# Patient Record
Sex: Female | Born: 1940 | Race: White | Hispanic: No | Marital: Married | Smoking: Never smoker
Health system: Southern US, Community
[De-identification: ages and names within clinical notes are randomized; demographics above are authoritative.]

## PROBLEM LIST (undated history)

## (undated) DIAGNOSIS — Z8711 Personal history of peptic ulcer disease: Secondary | ICD-10-CM

## (undated) DIAGNOSIS — K5790 Diverticulosis of intestine, part unspecified, without perforation or abscess without bleeding: Secondary | ICD-10-CM

## (undated) DIAGNOSIS — I639 Cerebral infarction, unspecified: Secondary | ICD-10-CM

## (undated) DIAGNOSIS — I10 Essential (primary) hypertension: Secondary | ICD-10-CM

## (undated) DIAGNOSIS — E785 Hyperlipidemia, unspecified: Secondary | ICD-10-CM

## (undated) DIAGNOSIS — K635 Polyp of colon: Secondary | ICD-10-CM

## (undated) DIAGNOSIS — N3090 Cystitis, unspecified without hematuria: Secondary | ICD-10-CM

## (undated) DIAGNOSIS — I480 Paroxysmal atrial fibrillation: Secondary | ICD-10-CM

## (undated) DIAGNOSIS — E1110 Type 2 diabetes mellitus with ketoacidosis without coma: Secondary | ICD-10-CM

## (undated) DIAGNOSIS — E119 Type 2 diabetes mellitus without complications: Secondary | ICD-10-CM

## (undated) DIAGNOSIS — J189 Pneumonia, unspecified organism: Secondary | ICD-10-CM

## (undated) HISTORY — PX: INSERTION OF CARDIAC MONITOR: SHX6657

## (undated) HISTORY — PX: EYE SURGERY: SHX253

## (undated) HISTORY — DX: Personal history of peptic ulcer disease: Z87.11

## (undated) HISTORY — DX: Essential (primary) hypertension: I10

## (undated) HISTORY — PX: CATARACT EXTRACTION: SUR2

## (undated) HISTORY — PX: ESOPHAGOGASTRODUODENOSCOPY: SHX1529

## (undated) HISTORY — DX: Hyperlipidemia, unspecified: E78.5

## (undated) HISTORY — DX: Pneumonia, unspecified organism: J18.9

---

## 1992-07-07 HISTORY — PX: ABDOMINAL HYSTERECTOMY: SHX81

## 2004-06-05 ENCOUNTER — Ambulatory Visit: Payer: Self-pay | Admitting: Internal Medicine

## 2004-07-04 ENCOUNTER — Ambulatory Visit: Payer: Self-pay | Admitting: Family Medicine

## 2004-07-05 ENCOUNTER — Ambulatory Visit: Payer: Self-pay | Admitting: Internal Medicine

## 2005-03-27 ENCOUNTER — Ambulatory Visit: Payer: Self-pay | Admitting: Internal Medicine

## 2005-03-27 LAB — HM COLONOSCOPY: HM Colonoscopy: NORMAL

## 2005-07-15 ENCOUNTER — Ambulatory Visit: Payer: Self-pay | Admitting: Family Medicine

## 2006-07-17 ENCOUNTER — Ambulatory Visit: Payer: Self-pay | Admitting: Family Medicine

## 2006-11-11 ENCOUNTER — Ambulatory Visit: Payer: Self-pay | Admitting: Otolaryngology

## 2006-11-25 ENCOUNTER — Ambulatory Visit: Payer: Self-pay | Admitting: Family Medicine

## 2007-05-03 DIAGNOSIS — I10 Essential (primary) hypertension: Secondary | ICD-10-CM | POA: Insufficient documentation

## 2007-07-19 ENCOUNTER — Ambulatory Visit: Payer: Self-pay | Admitting: Family Medicine

## 2008-04-16 DIAGNOSIS — J309 Allergic rhinitis, unspecified: Secondary | ICD-10-CM | POA: Insufficient documentation

## 2008-04-18 ENCOUNTER — Ambulatory Visit: Payer: Self-pay | Admitting: Family Medicine

## 2008-07-20 ENCOUNTER — Ambulatory Visit: Payer: Self-pay | Admitting: Family Medicine

## 2008-12-21 DIAGNOSIS — J45909 Unspecified asthma, uncomplicated: Secondary | ICD-10-CM | POA: Insufficient documentation

## 2009-07-25 ENCOUNTER — Ambulatory Visit: Payer: Self-pay | Admitting: Family Medicine

## 2010-08-01 ENCOUNTER — Ambulatory Visit: Payer: Self-pay | Admitting: Family Medicine

## 2011-08-04 ENCOUNTER — Ambulatory Visit: Payer: Self-pay | Admitting: Family Medicine

## 2012-08-04 ENCOUNTER — Ambulatory Visit: Payer: Self-pay | Admitting: Family Medicine

## 2013-08-05 ENCOUNTER — Ambulatory Visit: Payer: Self-pay | Admitting: Family Medicine

## 2013-09-12 ENCOUNTER — Observation Stay: Payer: Self-pay | Admitting: Internal Medicine

## 2013-09-12 LAB — URINALYSIS, COMPLETE
Bilirubin,UR: NEGATIVE
Glucose,UR: NEGATIVE mg/dL (ref 0–75)
KETONE: NEGATIVE
NITRITE: POSITIVE
PROTEIN: NEGATIVE
Ph: 5 (ref 4.5–8.0)
RBC,UR: 4 /HPF (ref 0–5)
Specific Gravity: 1.011 (ref 1.003–1.030)
Squamous Epithelial: 2

## 2013-09-12 LAB — COMPREHENSIVE METABOLIC PANEL
ALBUMIN: 3.4 g/dL (ref 3.4–5.0)
Alkaline Phosphatase: 74 U/L
Anion Gap: 8 (ref 7–16)
BILIRUBIN TOTAL: 0.4 mg/dL (ref 0.2–1.0)
BUN: 16 mg/dL (ref 7–18)
CALCIUM: 8.6 mg/dL (ref 8.5–10.1)
CO2: 28 mmol/L (ref 21–32)
Chloride: 103 mmol/L (ref 98–107)
Creatinine: 1.06 mg/dL (ref 0.60–1.30)
EGFR (African American): >60
EGFR (Non-African Amer.): 53 — ABNORMAL LOW
Glucose: 165 mg/dL — ABNORMAL HIGH (ref 65–99)
OSMOLALITY: 282 (ref 275–301)
POTASSIUM: 3.2 mmol/L — AB (ref 3.5–5.1)
SGOT(AST): 32 U/L (ref 15–37)
SGPT (ALT): 16 U/L (ref 12–78)
SODIUM: 139 mmol/L (ref 136–145)
Total Protein: 7.5 g/dL (ref 6.4–8.2)

## 2013-09-12 LAB — CBC WITH DIFFERENTIAL/PLATELET
Basophil #: 0.1 10*3/uL (ref 0.0–0.1)
Basophil %: 0.9 %
Eosinophil #: 0.3 10*3/uL (ref 0.0–0.7)
Eosinophil %: 3.3 %
HCT: 39.1 % (ref 35.0–47.0)
HGB: 12.7 g/dL (ref 12.0–16.0)
Lymphocyte #: 1.7 10*3/uL (ref 1.0–3.6)
Lymphocyte %: 18.3 %
MCH: 30.4 pg (ref 26.0–34.0)
MCHC: 32.5 g/dL (ref 32.0–36.0)
MCV: 93 fL (ref 80–100)
MONOS PCT: 4 %
Monocyte #: 0.4 x10 3/mm (ref 0.2–0.9)
NEUTROS ABS: 6.7 10*3/uL — AB (ref 1.4–6.5)
Neutrophil %: 73.5 %
Platelet: 246 10*3/uL (ref 150–440)
RBC: 4.18 10*6/uL (ref 3.80–5.20)
RDW: 12.8 % (ref 11.5–14.5)
WBC: 9.2 10*3/uL (ref 3.6–11.0)

## 2013-09-12 LAB — RAPID INFLUENZA A&B ANTIGENS

## 2013-09-13 LAB — BASIC METABOLIC PANEL
Anion Gap: 5 — ABNORMAL LOW (ref 7–16)
BUN: 14 mg/dL (ref 7–18)
CHLORIDE: 100 mmol/L (ref 98–107)
CREATININE: 1.18 mg/dL (ref 0.60–1.30)
Calcium, Total: 8.6 mg/dL (ref 8.5–10.1)
Co2: 32 mmol/L (ref 21–32)
EGFR (African American): 54 — ABNORMAL LOW
EGFR (Non-African Amer.): 47 — ABNORMAL LOW
Glucose: 106 mg/dL — ABNORMAL HIGH (ref 65–99)
OSMOLALITY: 275 (ref 275–301)
POTASSIUM: 3.1 mmol/L — AB (ref 3.5–5.1)
Sodium: 137 mmol/L (ref 136–145)

## 2013-09-13 LAB — CBC WITH DIFFERENTIAL/PLATELET
BASOS PCT: 0.4 %
Basophil #: 0.1 10*3/uL (ref 0.0–0.1)
EOS ABS: 0.1 10*3/uL (ref 0.0–0.7)
Eosinophil %: 0.8 %
HCT: 34.8 % — AB (ref 35.0–47.0)
HGB: 11.7 g/dL — ABNORMAL LOW (ref 12.0–16.0)
LYMPHS ABS: 2.7 10*3/uL (ref 1.0–3.6)
Lymphocyte %: 20.9 %
MCH: 31.4 pg (ref 26.0–34.0)
MCHC: 33.6 g/dL (ref 32.0–36.0)
MCV: 94 fL (ref 80–100)
Monocyte #: 0.5 x10 3/mm (ref 0.2–0.9)
Monocyte %: 3.8 %
Neutrophil #: 9.5 10*3/uL — ABNORMAL HIGH (ref 1.4–6.5)
Neutrophil %: 74.1 %
PLATELETS: 198 10*3/uL (ref 150–440)
RBC: 3.72 10*6/uL — ABNORMAL LOW (ref 3.80–5.20)
RDW: 13.1 % (ref 11.5–14.5)
WBC: 12.8 10*3/uL — ABNORMAL HIGH (ref 3.6–11.0)

## 2013-09-13 LAB — URINE CULTURE

## 2013-09-17 LAB — CULTURE, BLOOD (SINGLE)

## 2014-07-04 ENCOUNTER — Ambulatory Visit: Payer: Self-pay | Admitting: Family Medicine

## 2014-07-04 LAB — HM DEXA SCAN

## 2014-08-03 ENCOUNTER — Ambulatory Visit: Payer: Self-pay | Admitting: Ophthalmology

## 2014-08-03 LAB — POTASSIUM: POTASSIUM: 3.7 mmol/L (ref 3.5–5.1)

## 2014-08-07 ENCOUNTER — Ambulatory Visit: Payer: Self-pay | Admitting: Family Medicine

## 2014-08-07 LAB — HM MAMMOGRAPHY: HM Mammogram: NEGATIVE

## 2014-08-10 ENCOUNTER — Ambulatory Visit: Payer: Self-pay | Admitting: Ophthalmology

## 2014-09-13 ENCOUNTER — Ambulatory Visit: Payer: Self-pay | Admitting: Ophthalmology

## 2014-09-21 ENCOUNTER — Ambulatory Visit: Payer: Self-pay | Admitting: Ophthalmology

## 2014-10-25 LAB — LIPID PANEL
CHOLESTEROL: 224 mg/dL — AB (ref 0–200)
HDL: 67 mg/dL (ref 35–70)
LDL CALC: 128 mg/dL
TRIGLYCERIDES: 143 mg/dL (ref 40–160)

## 2014-10-25 LAB — BASIC METABOLIC PANEL
BUN: 17 mg/dL (ref 4–21)
Creatinine: 1 mg/dL (ref 0.5–1.1)
Glucose: 111 mg/dL
Potassium: 4 mmol/L (ref 3.4–5.3)
SODIUM: 143 mmol/L (ref 137–147)

## 2014-10-25 LAB — HEMOGLOBIN A1C: Hgb A1c MFr Bld: 6.5 % — AB (ref 4.0–6.0)

## 2014-10-28 NOTE — Discharge Summary (Signed)
Dates of Admission and Diagnosis:  Date of Admission 12-Sep-2013   Date of Discharge 13-Sep-2013   Admitting Diagnosis Sepsis due to Pneumonia and UTI   Final Diagnosis Sepsis due to Pneumonia and UTI Asthma Hypertension, P stable in hospital without meds- with diet control. Hyperlipidemia    Chief Complaint/History of Present Illness The patient is a 74 year old pleasant Caucasian female with past medical history of hypertension, hyperlipidemia, and asthma. He is presenting to the ER with the chief complaint of intermittent episodes of fever for the past 1 week and seen by her PCP last Thursday and started on cough medicines. Flu test was negative at that time. She felt slightly better, but today morning she woke up with chills and rigors associated with nausea and temperature was 102 degrees Fahrenheit in the ED. Denies any chest pain but feels weak and tired. Denies any dizziness or loss of consciousness.   Allergies:  Sulfa drugs: Unknown  Hepatic:  09-Mar-15 04:13   Bilirubin, Total 0.4  Alkaline Phosphatase 74 (45-117 NOTE: New Reference Range 05/27/13)  SGPT (ALT) 16  SGOT (AST) 32  Total Protein, Serum 7.5  Albumin, Serum 3.4  Routine Chem:  09-Mar-15 04:13   Glucose, Serum  165  BUN 16  Creatinine (comp) 1.06  Sodium, Serum 139  Potassium, Serum  3.2  Chloride, Serum 103  CO2, Serum 28  Calcium (Total), Serum 8.6  Anion Gap 8  Osmolality (calc) 282  eGFR (African American) >60  eGFR (Non-African American)  53 (eGFR values <68mL/min/1.73 m2 may be an indication of chronic kidney disease (CKD). Calculated eGFR is useful in patients with stable renal function. The eGFR calculation will not be reliable in acutely ill patients when serum creatinine is changing rapidly. It is not useful in  patients on dialysis. The eGFR calculation may not be applicable to patients at the low and high extremes of body sizes, pregnant women, and vegetarians.)  Routine Hem:   09-Mar-15 04:13   WBC (CBC) 9.2  RBC (CBC) 4.18  Hemoglobin (CBC) 12.7  Hematocrit (CBC) 39.1  Platelet Count (CBC) 246  MCV 93  MCH 30.4  MCHC 32.5  RDW 12.8  Neutrophil % 73.5  Lymphocyte % 18.3  Monocyte % 4.0  Eosinophil % 3.3  Basophil % 0.9  Neutrophil #  6.7  Lymphocyte # 1.7  Monocyte # 0.4  Eosinophil # 0.3  Basophil # 0.1 (Result(s) reported on 12 Sep 2013 at 04:26AM.)   PERTINENT RADIOLOGY STUDIES: XRay:    09-Mar-15 04:41, Chest PA and Lateral  Chest PA and Lateral   REASON FOR EXAM:    fever, cough  COMMENTS:       PROCEDURE: DXR - DXR CHEST PA (OR AP) AND LATERAL  - Sep 12 2013  4:41AM     CLINICAL DATA:  Cough for 1 week, fever.  History of asthma.    EXAM:  CHEST  2 VIEW    COMPARISON:  Chest radiograph report July 20, 1995 though images  are not available for direct comparison.    FINDINGS:  Cardiomediastinal silhouette is nonsuspicious, mildly calcified  aortic knob. Mild interstitial prominence, with strandy densities in  the lingula. No pleural effusions. No pneumothorax.    Soft tissue planes and included osseous structures are  nonsuspicious. Moderate approximate T12-L1 degenerative disc  disease.     IMPRESSION:  Mild interstitial prominence with lingular strandy densities,  findings could reflect bronchitis or possibly reactive airway  disease with lingular atelectasis.  Electronically Signed    By: Elon Alas    On: 09/12/2013 04:58         Verified By: Ricky Ala, [name],   Pertinent Past History:  Pertinent Past History PAST MEDICAL HISTORY: Hypertension as well as hyperlipidemia.   PAST SURGICAL HISTORY: Hysterectomy.   Hospital Course:  Hospital Course 1.  Sepsis secondary to pneumonia and acute cystitis.  given IV levofloxacin. Blcx negative- improved. 2.  Hypertension. held hydrochlorothiazide , BP is normal. does not need BP meds- advised to have diet control. 3.  Hyperlipidemia. Continue  statin.  4.  Asthma, no exacerbation. continue her inhalers.   Condition on Discharge Stable   Code Status:  Code Status Full Code   DISCHARGE INSTRUCTIONS HOME MEDS:  Medication Reconciliation: Patient's Home Medications at Discharge:     Medication Instructions  advair diskus 250 mcg-50 mcg inhalation powder  1 puff(s) inhaled 2 times a day   lovastatin 20 mg oral tablet  1 tab(s) orally once a day   montelukast 10 mg oral tablet  1 tab(s) orally once a day (in the evening)   fish oil - oral capsule  1 cap(s) orally once a day   vitamin e  1 tab(s) orally once a day   levaquin 750 mg oral tablet  1 tab(s) orally every other day x 6 days    STOP TAKING THE FOLLOWING MEDICATION(S):    hydrochlorothiazide 25 mg oral tablet: 1 tab(s) orally once a day  Physician's Instructions:  Home Health? No   Diet Low Sodium   Activity Limitations As tolerated   Return to Work Not Applicable   Time frame for Follow Up Appointment 1-2 weeks   Other Comments routine check up for Blood pressure meds with PMD.   Electronic Signatures: Vaughan Basta (MD)  (Signed 14-Mar-15 03:56)  Authored: ADMISSION DATE AND DIAGNOSIS, CHIEF COMPLAINT/HPI, Allergies, PERTINENT LABS, PERTINENT RADIOLOGY STUDIES, PERTINENT PAST [place] MEDS, PATIENT INSTRUCTIONS   Last Updated: 14-Mar-15 03:56 by Vaughan Basta (MD)

## 2014-10-28 NOTE — H&P (Signed)
PATIENT NAME:  Destiny Gilbert, MARRONE MR#:  [id number] DATE OF BIRTH:  [birth year]  DATE OF ADMISSION:  09/12/2013  PRIMARY CARE PHYSICIAN: Lelon Huh, MD  REFERRING PHYSICIAN: Conni Slipper, MD   CHIEF COMPLAINT: Fever and cough for 1 week, chills and rigors today.   HISTORY OF PRESENT ILLNESS: The patient is a 74 year old pleasant Caucasian female with past medical history of hypertension, hyperlipidemia, and asthma. He is presenting to the ER with the chief complaint of intermittent episodes of fever for the past 1 week and seen by her PCP last Thursday and started on cough medicines. Flu test was negative at that time. She felt slightly better, but today morning she woke up with chills and rigors associated with nausea and temperature was 102 degrees Fahrenheit in the ED. Denies any chest pain but feels weak and tired. Denies any dizziness or loss of consciousness.   PAST MEDICAL HISTORY: Hypertension as well as hyperlipidemia.   PAST SURGICAL HISTORY: Hysterectomy.   ALLERGIES: SULFA DRUGS.   PSYCHOSOCIAL HISTORY: Lives at home with husband. Denies any history of smoking, alcohol, or illicit drug usage.   FAMILY HISTORY: Hypertension runs in her family.   HOME MEDICATIONS: Vitamin E 1 tablet p.o. once daily, singular 10 mg p.o. once daily, hydrochlorothiazide 25 mg p.o. once daily, fish oil 1 capsule p.o. once daily, Advair 250 mcg 1 puff inhalation 2 times a day.  REVIEW OF SYSTEMS:  CONSTITUTIONAL: Denies any weakness but complaining of fever and fatigue. Denies weight loss or weight gain. EYES: Denies blurry vision, double vision.  ENT: Denies epistaxis, discharge.  RESPIRATORY: Has chronic history of asthma. Has 1 week history of cough.  CARDIOVASCULAR: No chest pain, palpitations.  GASTROINTESTINAL: Denies nausea, vomiting, diarrhea.  GENITOURINARY: No dysuria, hematuria.  GYNECOLOGIC AND BREASTS: Denies breast mass or vaginal discharge.  ENDOCRINE: Denies polyuria, nocturia,  thyroid problems.  HEMATOLOGIC AND LYMPHATIC: Denies any easy bruising or bleeding. INTEGUMENTARY: No acne, rash, or lesions. MUSCULOSKELETAL: No joint pain in the neck and back. NEUROLOGIC: Denies vertigo, ataxia. PSYCHIATRIC: No ADD or OCD.   PHYSICAL EXAMINATION:  VITAL SIGNS: Temperature 100.7, pulse 86, respirations 20, blood pressure 106/55, pulse ox 95% on 2 liters nasal cannula.  GENERAL APPEARANCE: Not under acute distress. Moderately built and nourished.  HEENT: Normocephalic, atraumatic. Pupils are equally reacting to light and accommodation. No scleral icterus. No conjunctival injection. No sinus tenderness. Positive postnasal drip.  NECK: Supple. No JVD or thyromegaly. Range of motion is intact.  LUNGS: Moderate air entry. Positive crackles. No wheezing.  HEART: S1 and S2 normal. Regular rate and rhythm, tachycardic.  ABDOMEN: Soft, obese. Bowel sounds are positive in all 4 quadrants. Nontender, nondistended. No hepatosplenomegaly. No masses felt.  NEUROLOGIC: Awake, alert, and oriented x3. Motor and sensory grossly intact. Reflexes are 2+.  EXTREMITIES: No edema. No cyanosis. No clubbing.  SKIN: Warm to touch. Normal turgor. No rashes. No lesions.   LABORATORY AND IMAGING STUDIES: LFTs are normal. CBC normal. D-dimer is elevated at 0.70. Influenza test is negative. Urinalysis: Yellow in color, hazy in appearance. Nitrites are positive, leukocyte esterase 2+. Glucose 165, BUN 16, creatinine 1.06, sodium 139, potassium 3.2, chloride 103, CO2 28. GFR greater than 60. Anion gap is 8. Serum osmolality 282. Calcium 8.6   EKG, 12 lead: Normal sinus rhythm with tachycardia at 100 beats. Normal PR and QRS interval. Nonspecific ST-T wave changes.  CT angiogram of the chest: Negative for pulmonary embolism. Patchy left lower lobe airspace opacities suspicious for pneumonia. Recommend  radiographic follow-up for resolution.  ASSESSMENT AND PLAN: A 74 year old female presenting to the ER  with a chief complaint of 1 week history of fever and cough and today with chills and rigors. Will be admitted with following assessment and plan:  1.  Sepsis secondary to pneumonia and acute cystitis. Pan cultures were obtained. The patient will be on IV levofloxacin. We will provide her Tylenol as needed basis for temperature.  2.  Hypertension. Resume her home medications including hydrochlorothiazide and up titrate medications on as needed basis.  3.  Hyperlipidemia. Continue statin.  4.  Asthma, no exacerbation. Will continue her inhalers.  5.  We will provide gastrointestinal and deep vein thrombosis prophylaxis.  Plan of care discussed with the patient and her husband at bedside. They both verbalized understanding of the plan.   TOTAL TIME SPENT ON ADMISSION: 45 minutes.  ____________________________ Nicholes Mango, MD ag:sb D: 09/12/2013 07:44:08 ET T: 09/12/2013 08:24:22 ET JOB#: [id number]  cc: Nicholes Mango, MD, <Dictator> Kirstie Peri. Caryn Section, MD Nicholes Mango MD ELECTRONICALLY SIGNED 10/06/2013 2:36

## 2014-11-05 NOTE — Op Note (Signed)
PATIENT NAME:  Destiny Gilbert, Destiny Gilbert MR#:  [id number] DATE OF BIRTH:  September 11, 1940  DATE OF PROCEDURE:  09/21/2014  PREOPERATIVE DIAGNOSIS:  Nuclear sclerotic cataract, left eye.  POSTOPERATIVE DIAGNOSIS:  Nuclear sclerotic cataract, left eye.  PROCEDURE:  Phacoemulsification with posterior chamber intraocular lens left eye, model SN60WF.   SURGEON:  Lyla Glassing, MD  INDICATIONS:  This is a 74 year old with decreased vision in the left eye.  PROCEDURE:  The risks and benefits of cataract surgery were discussed at length with the patient, including bleeding, infection, retinal detachment, re-operation, diplopia, ptosis, loss of vision, and loss of the eye. Informed consent was obtained. On the day of surgery, several sets of preoperative medication were administered to the operative eye including 0.5% tetracaine,1% cyclopentolate, 10% phenylephrine, 0.5% ketorolac, 0.5% gatifloxacin, and 2% lidocaine .  The patient was taken to the operating room and sedated via IV sedation. Topical tetracaine was placed in the eye. The operative eye was prepped using a diluted 10% Betadine solution and then covered in sterile drapes leaving only the operative eye exposed. A Lieberman lid speculum was placed to provide exposure. Using 0.12 forceps and a sideport blade, a paracentesis was created. Then a mixture of BSS, preservative free lidocaine, and epinephrine was injected into the anterior chamber. Next, a 2.4 mm keratome blade was used to create a two-step full-thickness clear corneal incision temporally. The cystitome and Utrata forceps were used to create a continuous capsulorrhexis in the anterior lens capsule. BSS on a hydrodissection cannula was used to perform gentle hydrodissection. Phacoemulsification was then performed to remove the nucleus. Irrigation and aspiration was performed to remove the remaining cortical material. Provisc was injected to fill the capsular bag and anterior chamber. A 24.0 diopter  SN60WF intraocular lens was injected into the capsular bag. The Connor wand was used to rotate it into proper position in the capsular bag. Irrigation and aspiration was performed to remove the remaining Viscoelastic material from the eye. BSS on a 30-gauge cannula was used to hydrate the wound. An intracameral antibiotic was administered. The wounds were checked and found to be watertight. The lid speculum and drapes were carefully removed. Several drops of Vigamox were placed in the operative eye. The eye was covered with protective eyewear. The patient was taken to the recovery area in good condition. There were no complications.  DIAGNOSIS CODE:  825.12.   ____________________________ Lyla Glassing, MD nm:tr D: 09/21/2014 10:20:31 ET T: 09/21/2014 12:38:21 ET JOB#: [id number]  cc: Lyla Glassing, MD, <Dictator> Lyla Glassing MD ELECTRONICALLY SIGNED 09/28/2014 10:47

## 2014-11-05 NOTE — Op Note (Signed)
PATIENT NAME:  Destiny Gilbert, Destiny Gilbert MR#:  [id number] DATE OF BIRTH:  11-Jul-1940  DATE OF PROCEDURE:  08/10/2014  PREOPERATIVE DIAGNOSIS:  Nuclear sclerotic cataract, right eye.  POSTOPERATIVE DIAGNOSIS:  Nuclear sclerotic cataract, right eye.  PROCEDURE:  Phacoemulsification with posterior chamber intraocular lens right eye, model SN60WF  SURGEON:  Lyla Glassing, MD  INDICATIONS:  This is a 74 year old female with decreased vision in the right eye.  PROCEDURE:  The risks and benefits of cataract surgery were discussed at length with the patient, including bleeding, infection, retinal detachment, re-operation, diplopia, ptosis, loss of vision, and loss of the eye. Informed consent was obtained. On the day of surgery, several sets of preoperative medication were administered to the operative eye including 0.5% tetracaine,1% cyclopentolate, 10% phenylephrine, 0.5% ketorolac, 0.5% gatifloxacin, and 2% lidocaine .  The patient was taken to the operating room and sedated via IV sedation. Topical tetracaine was placed in the eye. The operative eye was prepped using a 10% Betadine solution and then covered in sterile drapes leaving only the operative eye exposed. A Lieberman lid speculum was placed to provide exposure. Using 0.12 forceps and a sideport blade, a paracentesis was created. Then a mixture of BSS, preservative free lidocaine, and epinephrine was injected into the anterior chamber. Next, a 2.4 mm keratome blade was used to create a two-step full-thickness clear corneal incision temporally. The cystitome and Utrata forceps were used to create a continuous capsulorrhexis in the anterior lens capsule. BSS on a hydrodissection cannula was used to perform gentle hydrodissection. Phacoemulsification was then performed to remove the nucleus. Irrigation and aspiration was performed to remove the remaining cortical material. Provisc was injected to fill the capsular bag and anterior chamber. A 25.0 diopter  SN60WF intraocular lens was injected into the capsular bag. The Connor wand was used to rotate it into proper position in the capsular bag. Irrigation and aspiration was performed to remove the remaining Viscoelastic material from the eye. BSS on a 30-gauge cannula was used to hydrate the wound. An intracameral antibiotic was administered. The wounds were checked and found to be watertight. The lid speculum and drapes were carefully removed. Several drops of Vigamox were placed in the operative eye. The eye was covered with protective eyewear. The patient was taken to the recovery area in good condition. There were no complications. ____________________________ Lyla Glassing, MD nm:ap D: 08/10/2014 11:45:00 ET T: 08/10/2014 17:09:36 ET JOB#: [id number]  cc: Lyla Glassing, MD, <Dictator> Lyla Glassing MD ELECTRONICALLY SIGNED 08/17/2014 14:44

## 2015-01-23 ENCOUNTER — Ambulatory Visit (INDEPENDENT_AMBULATORY_CARE_PROVIDER_SITE_OTHER): Payer: PPO | Admitting: Family Medicine

## 2015-01-23 ENCOUNTER — Encounter: Payer: Self-pay | Admitting: Family Medicine

## 2015-01-23 VITALS — BP 124/70 | HR 87 | Temp 98.6°F | Resp 16 | Wt 169.0 lb

## 2015-01-23 DIAGNOSIS — J189 Pneumonia, unspecified organism: Secondary | ICD-10-CM

## 2015-01-23 DIAGNOSIS — S8391XA Sprain of unspecified site of right knee, initial encounter: Secondary | ICD-10-CM

## 2015-01-23 DIAGNOSIS — S93401A Sprain of unspecified ligament of right ankle, initial encounter: Secondary | ICD-10-CM | POA: Diagnosis not present

## 2015-01-23 HISTORY — DX: Pneumonia, unspecified organism: J18.9

## 2015-01-23 NOTE — Progress Notes (Signed)
Patient: Destiny Gilbert Female    DOB: 1941/06/03   74 y.o.   MRN: [id number] Visit Date: 01/27/2015  Today's Provider: Lelon Huh, MD   Chief Complaint  Patient presents with  . Leg Pain   Subjective:    Leg Pain  The incident occurred more than 1 week ago. The incident occurred in the yard. The injury mechanism was a burn and an eversion injury. The pain is present in the right knee, right leg and right ankle. The quality of the pain is described as burning, shooting and stabbing. The pain is at a severity of 6/10. The pain is moderate. The pain has been intermittent since onset. Associated symptoms include an inability to bear weight, a loss of motion and muscle weakness. Pertinent negatives include no numbness. She reports no foreign bodies present. The symptoms are aggravated by weight bearing and movement. She has tried nothing for the symptoms. The treatment provided no relief.   Pain first started about 2 months ago after she had been on her feet and walking more than usual. Pain is in back of knee and ankle and worse when ambulating. No weakness. Has not taking any medications for pain. Not used any braces or support. She does have prescription for meloxicam, but has not been taking it.     Allergies  Allergen Reactions  . Sulfa Antibiotics Nausea And Vomiting   Previous Medications   ALBUTEROL (PROAIR HFA) 108 (90 BASE) MCG/ACT INHALER    Inhale 2 puffs into the lungs every 6 (six) hours as needed.   ASPIRIN EC 81 MG TABLET    Take 81 mg by mouth daily.   FLUTICASONE (FLONASE) 50 MCG/ACT NASAL SPRAY    Place 1-2 sprays into the nose daily.   FLUTICASONE-SALMETEROL (ADVAIR DISKUS) 250-50 MCG/DOSE AEPB    Inhale 1 Inhaler into the lungs 2 (two) times daily.   GLUCOSAMINE-CHONDROIT-VIT C-MN (GLUCOSAMINE CHONDR 1500 COMPLX) CAPS    Take 1 capsule by mouth daily.   LOVASTATIN (MEVACOR) 40 MG TABLET    Take 1 tablet by mouth daily.   MELOXICAM (MOBIC) 15 MG TABLET    Take  1 tablet by mouth as needed.   MULTIPLE VITAMIN PO    Take 1 tablet by mouth daily.   RED YEAST RICE 600 MG TABS    Take 2 tablets by mouth daily.   TRIAMTERENE-HYDROCHLOROTHIAZIDE (MAXZIDE-25) 37.5-25 MG PER TABLET    Take 1 tablet by mouth daily.   VITAMIN E 400 UNITS TABS    Take 1 tablet by mouth daily.    Review of Systems  Respiratory: Positive for shortness of breath and wheezing.   Cardiovascular: Negative for chest pain, palpitations and leg swelling.  Neurological: Negative for weakness and numbness.    History  Substance Use Topics  . Smoking status: Never Smoker   . Smokeless tobacco: Not on file  . Alcohol Use: 0.0 oz/week    0 Standard drinks or equivalent per week     Comment: has 3 ounces of wine each week   Objective:   BP 124/70 mmHg  Pulse 87  Temp(Src) 98.6 F (37 C) (Oral)  Resp 16  Wt 169 lb (76.658 kg)  SpO2 95%  Physical Exam Mild tenderness along joint line of knee and ankles, and on limits of range of motion     Assessment & Plan:     1. Ankle sprain, right, initial encounter   2. Knee sprain, right, initial encounter  She has prescription for meloxicam and advised to start taking this for the next week or two. Recommend OTC elastic knee and ankle braces. Call if symptoms change or if not rapidly improving.            Lelon Huh, MD  [facility]

## 2015-01-27 ENCOUNTER — Encounter: Payer: Self-pay | Admitting: Family Medicine

## 2015-02-01 ENCOUNTER — Other Ambulatory Visit: Payer: Self-pay | Admitting: Family Medicine

## 2015-02-18 ENCOUNTER — Other Ambulatory Visit: Payer: Self-pay | Admitting: Family Medicine

## 2015-03-08 ENCOUNTER — Ambulatory Visit (INDEPENDENT_AMBULATORY_CARE_PROVIDER_SITE_OTHER): Payer: PPO | Admitting: Physician Assistant

## 2015-03-08 ENCOUNTER — Encounter: Payer: Self-pay | Admitting: Physician Assistant

## 2015-03-08 VITALS — BP 170/70 | HR 70 | Temp 98.0°F | Resp 16 | Wt 170.0 lb

## 2015-03-08 DIAGNOSIS — M170 Bilateral primary osteoarthritis of knee: Secondary | ICD-10-CM

## 2015-03-08 DIAGNOSIS — S8392XA Sprain of unspecified site of left knee, initial encounter: Secondary | ICD-10-CM

## 2015-03-08 MED ORDER — METHYLPREDNISOLONE ACETATE 40 MG/ML IJ SUSP: 40.0000 mg | Freq: Once | INTRAMUSCULAR | Status: DC

## 2015-03-08 NOTE — Progress Notes (Signed)
Patient: Destiny Gilbert Female    DOB: 1941-04-27   74 y.o.   MRN: [id number] Visit Date: 03/08/2015  Today's Provider: Mar Daring, PA-C   Chief Complaint  Patient presents with  . Left Knee Pain   Subjective:    Knee Pain  The incident occurred 3 to 6 hours ago ([place] 10 [place]). The incident occurred in the street. The pain is present in the left knee. The quality of the pain is described as aching, shooting and stabbing. The pain is at a severity of 10/10 (when standing or walking; if sitting is less between a 4-5). The pain is moderate. The pain has been constant since onset. Associated symptoms include an inability to bear weight. The symptoms are aggravated by movement and weight bearing. She has tried ice (took Mobic this morning) for the symptoms. The treatment provided mild (after the ice) relief.   She did not fall. She states that when she stepped down off the curb she just felt a sharp shooting pain in the posterior knee that went from the distal hamstring into the midcalf region of the left leg. She has recently seen Dr. Caryn Section in July for similar pain in the right knee and continued sensation of instability bilaterally.     Allergies  Allergen Reactions  . Sulfa Antibiotics Nausea And Vomiting   Previous Medications   ALBUTEROL (PROAIR HFA) 108 (90 BASE) MCG/ACT INHALER    Inhale 2 puffs into the lungs every 6 (six) hours as needed.   ASPIRIN EC 81 MG TABLET    Take 81 mg by mouth daily.   FLUTICASONE (FLONASE) 50 MCG/ACT NASAL SPRAY    Place 1-2 sprays into the nose daily.   FLUTICASONE-SALMETEROL (ADVAIR DISKUS) 250-50 MCG/DOSE AEPB    Inhale 1 Inhaler into the lungs 2 (two) times daily.   GLUCOSAMINE-CHONDROIT-VIT C-MN (GLUCOSAMINE CHONDR 1500 COMPLX) CAPS    Take 1 capsule by mouth daily.   LOVASTATIN (MEVACOR) 20 MG TABLET    TAKE ONE TABLET BY MOUTH IN THE EVENING   LOVASTATIN (MEVACOR) 40 MG TABLET     Take 1 tablet by mouth daily.   MELOXICAM (MOBIC) 15 MG TABLET    TAKE ONE TABLET BY MOUTH ONCE DAILY AS NEEDED FOR ARTHRITIS PAIN   MULTIPLE VITAMIN PO    Take 1 tablet by mouth daily.   RED YEAST RICE 600 MG TABS    Take 2 tablets by mouth daily.   TRIAMTERENE-HYDROCHLOROTHIAZIDE (MAXZIDE-25) 37.5-25 MG PER TABLET    Take 1 tablet by mouth daily.   VITAMIN E 400 UNITS TABS    Take 1 tablet by mouth daily.    Review of Systems  Constitutional: Negative.   HENT: Negative.   Eyes: Negative.   Respiratory: Negative.   Cardiovascular: Negative.  Negative for chest pain and palpitations.  Gastrointestinal: Negative.   Endocrine: Negative.   Genitourinary: Negative.   Musculoskeletal: Positive for arthralgias.  Skin: Negative.   Allergic/Immunologic: Negative.   Neurological: Negative.   Hematological: Bruises/bleeds easily.  Psychiatric/Behavioral: Negative.     Social History  Substance Use Topics  . Smoking status: Never Smoker   . Smokeless tobacco: Not on file  . Alcohol Use: 0.0 oz/week    0 Standard drinks or equivalent per week     Comment: has 3 ounces of wine each week   Objective:   BP 170/70 mmHg  Pulse 70  Temp(Src) 98 F (36.7 C) (Oral)  Resp 16  Wt 170 lb (77.111 kg)  Physical Exam  Constitutional: She appears well-developed and well-nourished. No distress.  Cardiovascular: Normal rate, regular rhythm and normal heart sounds.   Pulmonary/Chest: Effort normal and breath sounds normal. No respiratory distress. She has no wheezes. She has no rales.  Musculoskeletal:       Right knee: Normal.       Left knee: She exhibits swelling and effusion. She exhibits normal range of motion, no ecchymosis, no deformity, no erythema, normal alignment, no LCL laxity, normal patellar mobility, no bony tenderness, normal meniscus and no MCL laxity. Tenderness (most tenderness was over posterior knee over the hamstring insertion tendons and gastrocnemius origin tendons) found.  Lateral joint line tenderness noted.  Negative varus stress, valgus stress, lachman, anterior/posterior drawer and mcmurrays test.  Skin: She is not diaphoretic.  Vitals reviewed.       Assessment & Plan:     1. Knee sprain and strain, left, initial encounter I do feel that with the mechanism of injury that this is more so a knee strain of the ulcers that attach around the knee. It seems that she may have had a slight hyperextension of the knee as she stepped down that cause the stretch on the tendons insert around the knee. All of the ligaments were taught on exam and there appeared to be no meniscal injury. She denies any popping, clicking, locking of the knee. I advised her to continue her meloxicam daily and to continue the knee brace. She may also continue to ice the knee and stretch. I also advised her to rest and elevate the leg at any time that she possibly can.  - methylPREDNISolone acetate (DEPO-MEDROL) injection 40 mg; Inject 1 mL (40 mg total) into the articular space once.  2. Primary osteoarthritis of both knees I do feel that she most likely has arthritis of her knees bilaterally due to the instability sensation that she has and the occasional swelling that she gets. This coupled with the fact that the majority of her pain does come from when she is weightbearing alone seems like arthritis may be a factor. She has not had any imaging of her knees done. I did advise that if the pain does continue for her to call the office and we could go forth with getting x-rays and possible referral to orthopedics if necessary. I did give her a cortisone injection as below in the left knee to help with acute inflammation as she is leaving to go to [facility] tomorrow and is going to have to be up on her feet a lot and was worried that the pain would be too intolerable for her to bear. I did advise of warning signs and things to watch out for with the cortisone injection if any of those are  to occur she is to go to an urgent care or the ER immediately.  - methylPREDNISolone acetate (DEPO-MEDROL) injection 40 mg; Inject 1 mL (40 mg total) into the articular space once.  Procedure Note: Procedure, benefits, risk (including those of bleeding, infection, injury, allergic reaction) and alternatives were explained to the patient who voiced understanding of the information. Questions were sought and answered completely. Patient agreed to proceed and verbal consent was obtained. The location for injection was marked. The left knee was then prepped and draped in a sterile fashion. An injection of 1 cc 40 mg Depo-Medrol and 5 cc Xylocaine 1% without epinephrine was  injected into the lateral joint line of the left knee. There were no complications and no gross blood loss. The patient tolerated the procedure well. The injection point was then cleaned and covered with a dry dressing.      Mar Daring, PA-C  [facility]

## 2015-03-08 NOTE — Patient Instructions (Signed)
Knee Injection Joint injections are shots. Your caregiver will place a needle into your knee joint. The needle is used to put medicine into the joint. These shots can be used to help treat different painful knee conditions such as osteoarthritis, bursitis, local flare-ups of rheumatoid arthritis, and pseudogout. Anti-inflammatory medicines such as corticosteroids and anesthetics are the most common medicines used for joint and soft tissue injections.  PROCEDURE  The skin over the kneecap will be cleaned with an antiseptic solution.  Your caregiver will inject a small amount of a local anesthetic (a medicine like Novocaine) just under the skin in the area that was cleaned.  After the area becomes numb, a second injection is done. This second injection usually includes an anesthetic and an anti-inflammatory medicine called a steroid or cortisone. The needle is carefully placed in between the kneecap and the knee, and the medicine is injected into the joint space.  After the injection is done, the needle is removed. Your caregiver may place a bandage over the injection site. The whole procedure takes no more than a couple of minutes. BEFORE THE PROCEDURE  Wash all of the skin around the entire knee area. Try to remove any loose, scaling skin. There is no other specific preparation necessary unless advised otherwise by your caregiver. LET YOUR CAREGIVER KNOW ABOUT:   Allergies.  Medications taken including herbs, eye drops, over the counter medications, and creams.  Use of steroids (by mouth or creams).  Possible pregnancy, if applicable.  Previous problems with anesthetics or Novocaine.  History of blood clots (thrombophlebitis).  History of bleeding or blood problems.  Previous surgery.  Other health problems. RISKS AND COMPLICATIONS Side effects from cortisone shots are rare. They include:   Slight bruising of the skin.  Shrinkage of the normal fatty tissue under the skin where  the shot was given.  Increase in pain after the shot.  Infection.  Weakening of tendons or tendon rupture.  Allergic reaction to the medicine.  Diabetics may have a temporary increase in their blood sugar after a shot.  Cortisone can temporarily weaken the immune system. While receiving these shots, you should not get certain vaccines. Also, avoid contact with anyone who has chickenpox or measles. Especially if you have never had these diseases or have not been previously immunized. Your immune system may not be strong enough to fight off the infection while the cortisone is in your system. AFTER THE PROCEDURE   You can go home after the procedure.  You may need to put ice on the joint 15-20 minutes every 3 or 4 hours until the pain goes away.  You may need to put an elastic bandage on the joint. HOME CARE INSTRUCTIONS   Only take over-the-counter or prescription medicines for pain, discomfort, or fever as directed by your caregiver.  You should avoid stressing the joint. Unless advised otherwise, avoid activities that put a lot of pressure on a knee joint, such as:  Jogging.  Bicycling.  Recreational climbing.  Hiking.  Laying down and elevating the leg/knee above the level of your heart can help to minimize swelling. SEEK MEDICAL CARE IF:   You have repeated or worsening swelling.  There is drainage from the puncture area.  You develop red streaking that extends above or below the site where the needle was inserted. SEEK IMMEDIATE MEDICAL CARE IF:   You develop a fever.  You have pain that gets worse even though you are taking pain medicine.  The area is   red and warm, and you have trouble moving the joint. MAKE SURE YOU:   Understand these instructions.  Will watch your condition.  Will get help right away if you are not doing well or get worse. Document Released: 09/14/2006 Document Revised: 09/15/2011 Document Reviewed: 06/11/2007 [facility] Patient  Information 2015 [place], [place]. This information is not intended to replace advice given to you by your health care provider. Make sure you discuss any questions you have with your health care provider. Joint Injection Care After Refer to this sheet in the next few days. These instructions provide you with information on caring for yourself after you have had a joint injection. Your caregiver also may give you more specific instructions. Your treatment has been planned according to current medical practices, but problems sometimes occur. Call your caregiver if you have any problems or questions after your procedure. After any type of joint injection, it is not uncommon to experience:  Soreness, swelling, or bruising around the injection site.  Mild numbness, tingling, or weakness around the injection site caused by the numbing medicine used before or with the injection. It also is possible to experience the following effects associated with the specific agent after injection:  Iodine-based contrast agents:  Allergic reaction (itching, hives, widespread redness, and swelling beyond the injection site).  Corticosteroids (These effects are rare.):  Allergic reaction.  Increased blood sugar levels (If you have diabetes and you notice that your blood sugar levels have increased, notify your caregiver).  Increased blood pressure levels.  Mood swings.  Hyaluronic acid in the use of viscosupplementation.  Temporary heat or redness.  Temporary rash and itching.  Increased fluid accumulation in the injected joint. These effects all should resolve within a day after your procedure.  HOME CARE INSTRUCTIONS  Limit yourself to light activity the day of your procedure. Avoid lifting heavy objects, bending, stooping, or twisting.  Take prescription or over-the-counter pain medication as directed by your caregiver.  You may apply ice to your injection site to reduce pain and swelling the day  of your procedure. Ice may be applied 03-04 times:  Put ice in a plastic bag.  Place a towel between your skin and the bag.  Leave the ice on for no longer than 15-20 minutes each time. SEEK IMMEDIATE MEDICAL CARE IF:   Pain and swelling get worse rather than better or extend beyond the injection site.  Numbness does not go away.  Blood or fluid continues to leak from the injection site.  You have chest pain.  You have swelling of your face or tongue.  You have trouble breathing or you become dizzy.  You develop a fever, chills, or severe tenderness at the injection site that last longer than 1 day. MAKE SURE YOU:  Understand these instructions.  Watch your condition.  Get help right away if you are not doing well or if you get worse. Document Released: 03/06/2011 Document Revised: 09/15/2011 Document Reviewed: 03/06/2011 [facility] Patient Information 2015 [place], [place]. This information is not intended to replace advice given to you by your health care provider. Make sure you discuss any questions you have with your health care provider.

## 2015-03-20 ENCOUNTER — Telehealth: Payer: Self-pay | Admitting: Family Medicine

## 2015-03-20 NOTE — Telephone Encounter (Signed)
Pt called saying the meloxicam is hurting her stomach .  Is there anything else that she can take in it's place.  Her call back is  [phone]  Thanks Con Memos

## 2015-03-20 NOTE — Telephone Encounter (Signed)
Lease advise.

## 2015-03-20 NOTE — Telephone Encounter (Signed)
If it is helping, then she should continue to take it, but also take omeprazole 20mg  once a day, #30, rf x 3 to protect her stomach. Just about anything we prescribe would bother her stomach just as much. She only needs to take omeprazole on days she takes meloxicam.

## 2015-03-21 ENCOUNTER — Ambulatory Visit
Admission: RE | Admit: 2015-03-21 | Discharge: 2015-03-21 | Disposition: A | Discharge status: Home / Self Care | Payer: PPO | Source: Ambulatory Visit | Attending: Family Medicine | Admitting: Family Medicine

## 2015-03-21 ENCOUNTER — Ambulatory Visit (INDEPENDENT_AMBULATORY_CARE_PROVIDER_SITE_OTHER): Payer: PPO | Admitting: Family Medicine

## 2015-03-21 ENCOUNTER — Encounter: Payer: Self-pay | Admitting: Family Medicine

## 2015-03-21 VITALS — BP 138/84 | HR 79 | Temp 98.9°F | Resp 16 | Wt 169.0 lb

## 2015-03-21 DIAGNOSIS — M170 Bilateral primary osteoarthritis of knee: Secondary | ICD-10-CM

## 2015-03-21 DIAGNOSIS — M1712 Unilateral primary osteoarthritis, left knee: Secondary | ICD-10-CM | POA: Insufficient documentation

## 2015-03-21 DIAGNOSIS — S8392XD Sprain of unspecified site of left knee, subsequent encounter: Secondary | ICD-10-CM

## 2015-03-21 MED ORDER — TRAMADOL HCL 50 MG PO TABS: 50.0000 mg | ORAL_TABLET | Freq: Three times a day (TID) | ORAL | Status: DC | PRN

## 2015-03-21 NOTE — Telephone Encounter (Signed)
Spoke with pt's husband, he stated that she is on the way to her ov appointment on [facility] with Dr. Caryn Section at this moment. Will ask one of Dr Maralyn Sago nurses to access her regarding this request.   Thanks,

## 2015-03-21 NOTE — Telephone Encounter (Signed)
Phone line sounded busy. Will try again later.  Thanks,

## 2015-03-21 NOTE — Telephone Encounter (Signed)
Patient notified. Patient seen in office today.

## 2015-03-21 NOTE — Progress Notes (Signed)
Patient: Destiny Gilbert Female    DOB: [birth year]   74 y.o.   MRN: [id number] Visit Date: 03/21/2015  Today's Provider: Lelon Huh, MD   Chief Complaint  Patient presents with  . Leg Pain    Bilateral   Subjective:     Patient saw Fenton Malling 03/08/2015 for right knee pain. Patient received injection of methylPREDNISolone acetate (DEPO-MEDROL) injection 40 mg in the right knee.  Leg Pain  The incident occurred more than 1 week ago (x3 weeks ago). The incident occurred in the street. The injury mechanism was a twisting injury. The pain is present in the left knee. The quality of the pain is described as burning and shooting. The pain is at a severity of 7/10. The pain is moderate. The pain has been intermittent since onset. Associated symptoms include muscle weakness. Pertinent negatives include no numbness or tingling. She reports no foreign bodies present. The symptoms are aggravated by weight bearing and movement. She has tried acetaminophen and NSAIDs for the symptoms. The treatment provided moderate relief.   Had been doing well with knee pain using knee wraps and meloxicam until she tripped on curb injuring left knee about 2 weeks ago. She was seen 03/08/15 by Leone Haven and given steroid injection in knee which she states helpe quite a bit. She had to stop meloxicam last week due to stomach pains and pain then flared up. She has since been taking Tylenol 2 x 500mg  twice a day  helps for about 4 hours. .    Allergies  Allergen Reactions  . Sulfa Antibiotics Nausea And Vomiting   Previous Medications   ALBUTEROL (PROAIR HFA) 108 (90 BASE) MCG/ACT INHALER    Inhale 2 puffs into the lungs every 6 (six) hours as needed.   ASPIRIN EC 81 MG TABLET    Take 81 mg by mouth daily.   FLUTICASONE (FLONASE) 50 MCG/ACT NASAL SPRAY    Place 1-2 sprays into the nose daily.   FLUTICASONE-SALMETEROL (ADVAIR DISKUS) 250-50 MCG/DOSE AEPB    Inhale 1 Inhaler into the lungs 2 (two) times  daily.   GLUCOSAMINE-CHONDROIT-VIT C-MN (GLUCOSAMINE CHONDR 1500 COMPLX) CAPS    Take 1 capsule by mouth daily.   LOVASTATIN (MEVACOR) 20 MG TABLET    TAKE ONE TABLET BY MOUTH IN THE EVENING   LOVASTATIN (MEVACOR) 40 MG TABLET    Take 1 tablet by mouth daily.   MELOXICAM (MOBIC) 15 MG TABLET    TAKE ONE TABLET BY MOUTH ONCE DAILY AS NEEDED FOR ARTHRITIS PAIN   MULTIPLE VITAMIN PO    Take 1 tablet by mouth daily.   RED YEAST RICE 600 MG TABS    Take 2 tablets by mouth daily.   TRIAMTERENE-HYDROCHLOROTHIAZIDE (MAXZIDE-25) 37.5-25 MG PER TABLET    Take 1 tablet by mouth daily.   VITAMIN E 400 UNITS TABS    Take 1 tablet by mouth daily.    Review of Systems  Cardiovascular: Negative for chest pain, palpitations and leg swelling.  Neurological: Negative for dizziness, tingling, light-headedness, numbness and headaches.    Social History  Substance Use Topics  . Smoking status: Never Smoker   . Smokeless tobacco: Not on file  . Alcohol Use: 0.0 oz/week    0 Standard drinks or equivalent per week     Comment: has 3 ounces of wine each week   Objective:   BP 138/84 mmHg  Pulse 79  Temp(Src) 98.9 F (37.2 C) (Oral)  Resp  16  Wt 169 lb (76.658 kg)  SpO2 95%  Physical Exam  General appearance: alert, well developed, well nourished, cooperative and in no distress Head: Normocephalic, without obvious abnormality, atraumatic Lungs: Respirations even and unlabored Extremities: Moderate tednerness along medial and lateral joint lines of left knee. Minimal edema. Negative anterior drawer test. Bears full weight.  Skin: Skin color, texture, turgor normal. No rashes seen  Psych: Appropriate mood and affect. Neurologic: Mental status: Alert, oriented to person, place, and time, thought content appropriate.     Assessment & Plan:     1. Knee sprain and strain, left, subsequent encounter Initially did well after steroid injection and continuation of meloxicam, but had to stop NSAID due to GI  distress. Continue 1000mg  Tylenol twice a day and given rx for tramadol which she may take between doses of Tylenol. She plans to return for a second steroid injection if not better in 2-3 weeks. Continue wear OTC elastic knee vrace.   2. Primary osteoarthritis of both knees  - DG Knee Complete 4 Views Left; Future       Lelon Huh, MD  [facility]

## 2015-05-17 ENCOUNTER — Encounter: Payer: Self-pay | Admitting: Family Medicine

## 2015-05-17 ENCOUNTER — Ambulatory Visit (INDEPENDENT_AMBULATORY_CARE_PROVIDER_SITE_OTHER): Payer: PPO | Admitting: Family Medicine

## 2015-05-17 VITALS — BP 138/80 | HR 80 | Temp 97.8°F | Resp 16 | Ht 61.0 in | Wt 167.0 lb

## 2015-05-17 DIAGNOSIS — Z23 Encounter for immunization: Secondary | ICD-10-CM | POA: Diagnosis not present

## 2015-05-17 DIAGNOSIS — Z0000 Encounter for general adult medical examination without abnormal findings: Secondary | ICD-10-CM | POA: Diagnosis not present

## 2015-05-17 DIAGNOSIS — J4520 Mild intermittent asthma, uncomplicated: Secondary | ICD-10-CM

## 2015-05-17 DIAGNOSIS — I10 Essential (primary) hypertension: Secondary | ICD-10-CM | POA: Diagnosis not present

## 2015-05-17 DIAGNOSIS — J309 Allergic rhinitis, unspecified: Secondary | ICD-10-CM | POA: Diagnosis not present

## 2015-05-17 DIAGNOSIS — R7303 Prediabetes: Secondary | ICD-10-CM

## 2015-05-17 LAB — POCT GLYCOSYLATED HEMOGLOBIN (HGB A1C)
ESTIMATED AVERAGE GLUCOSE: 134
Hemoglobin A1C: 6.3

## 2015-05-17 MED ORDER — MONTELUKAST SODIUM 10 MG PO TABS: 10.0000 mg | ORAL_TABLET | Freq: Every day | ORAL | Status: DC

## 2015-05-17 MED ORDER — FLUTICASONE PROPIONATE 50 MCG/ACT NA SUSP: 1.0000 | Freq: Every day | NASAL | Status: DC

## 2015-05-17 NOTE — Progress Notes (Signed)
Patient: Destiny Gilbert, Female    DOB: 1941/05/30, 74 y.o.   MRN: [id number] Visit Date: 05/17/2015  Today's Provider: Lelon Huh, MD   Chief Complaint  Patient presents with  . Annual Exam  . Hypertension  . Hyperlipidemia  . Asthma  . Hyperglycemia   Subjective:    Annual physical  Destiny Gilbert is a 74 y.o. female. She feels well. She reports exercising never. She reports she is sleeping well.  -----------------------------------------------------------  Hypertension, follow-up:  BP Readings from Last 3 Encounters:  03/21/15 138/84  03/08/15 170/70  01/23/15 124/70    She was last seen for hypertension 7 months ago.  BP at that visit was 128/70. Management since that visit includes no changes. She reports good compliance with treatment. She is not having side effects.  She is not exercising. She is adherent to low salt diet.   Outside blood pressures are not being checked. She is experiencing dyspnea.  Patient denies chest pain, chest pressure/discomfort, claudication, exertional chest pressure/discomfort, fatigue, irregular heart beat, lower extremity edema, near-syncope, orthopnea, palpitations, paroxysmal nocturnal dyspnea, syncope and tachypnea.   Cardiovascular risk factors include advanced age (older than 69 for men, 50 for women), diabetes mellitus, dyslipidemia, hypertension and sedentary lifestyle.  Use of agents associated with hypertension: NSAIDS.     Weight trend: stable Wt Readings from Last 3 Encounters:  03/21/15 169 lb (76.658 kg)  03/08/15 170 lb (77.111 kg)  01/23/15 169 lb (76.658 kg)    Current diet: in general, a "healthy" diet    ------------------------------------------------------------------------   Lipid/Cholesterol, Follow-up:   Last seen for this7 months ago.  Management changes since that visit include recommending that patient cut back on saturated fats. . Last Lipid Panel:    Component Value Date/Time   CHOL 224* 10/25/2014   TRIG 143 10/25/2014   HDL 67 10/25/2014   Ralls 128 10/25/2014    Risk factors for vascular disease include diabetes mellitus, hypercholesterolemia and hypertension  She reports good compliance with treatment. She is not having side effects.  Current symptoms include none and have been stable. Weight trend: stable Prior visit with dietician: no Current diet: in general, a "healthy" diet   Current exercise: none  Wt Readings from Last 3 Encounters:  03/21/15 169 lb (76.658 kg)  03/08/15 170 lb (77.111 kg)  01/23/15 169 lb (76.658 kg)    -------------------------------------------------------------------  Prediabetes, Follow-up:   Lab Results  Component Value Date   HGBA1C 6.5* 10/25/2014   GLUCOSE 106* 09/13/2013   GLUCOSE 165* 09/12/2013    Last seen for for this7 months ago.  Management since that visit includes no changes. Current symptoms include none and have been stable.  Weight trend: stable Prior visit with dietician: no Current diet: in general, a "healthy" diet   Current exercise: none  Pertinent Labs:    Component Value Date/Time   CHOL 224* 10/25/2014   TRIG 143 10/25/2014   CREATININE 1.0 10/25/2014   CREATININE 1.18 09/13/2013 0421    Wt Readings from Last 3 Encounters:  03/21/15 169 lb (76.658 kg)  03/08/15 170 lb (77.111 kg)  01/23/15 169 lb (76.658 kg)    Asthma Follow up: Last office visit was 2 years ago and no changes were made. Patient reports fair compliance with treatment, good tolerance and poor symptom control. Patient states her Asthma has worsened over the last few weeks. Patient states she has dyspnea during exertion and when climbing stairs. Patient has not used the  Ventolin inhaler. She states that her allergies have been worse the last few weeks as well. She is using OTC Claritin, but has been out of Flonase for awhile.   Review of Systems  Constitutional: Negative for fever, chills and fatigue.    HENT: Positive for rhinorrhea, sneezing and sore throat. Negative for congestion and ear pain.   Eyes: Negative.  Negative for pain and redness.  Respiratory: Positive for shortness of breath (during exertion) and wheezing. Negative for cough.   Cardiovascular: Negative for chest pain and leg swelling.  Gastrointestinal: Negative for nausea, abdominal pain, diarrhea, constipation and blood in stool.  Endocrine: Negative for polydipsia and polyphagia.  Genitourinary: Negative.  Negative for dysuria, hematuria, flank pain, vaginal bleeding, vaginal discharge and pelvic pain.  Musculoskeletal: Positive for arthralgias. Negative for back pain, joint swelling and gait problem.  Skin: Negative for rash.  Allergic/Immunologic: Positive for environmental allergies.  Neurological: Negative.  Negative for dizziness, tremors, seizures, weakness, light-headedness, numbness and headaches.  Hematological: Negative for adenopathy.  Psychiatric/Behavioral: Negative.  Negative for behavioral problems, confusion and dysphoric mood. The patient is not nervous/anxious and is not hyperactive.     Social History   Social History  . Marital Status: Married    Spouse Name: N/A  . Number of Children: 3  . Years of Education: N/A   Occupational History  . Retired    Social History Main Topics  . Smoking status: Never Smoker   . Smokeless tobacco: Not on file  . Alcohol Use: 0.0 oz/week    0 Standard drinks or equivalent per week     Comment: has 3 ounces of wine each week  . Drug Use: No  . Sexual Activity: Not on file   Other Topics Concern  . Not on file   Social History Narrative    Patient Active Problem List   Diagnosis Date Noted  . Pre-diabetes 12/16/2009  . Adult situational stress disorder 04/16/2009  . Asthma 12/21/2008  . Allergic rhinitis 04/16/2008  . Essential (primary) hypertension 05/03/2007  . Mixed hyperlipidemia 07/07/2006  . Peptic ulcer 07/08/2003  . Colon,  diverticulosis 07/07/1998    Past Surgical History  Procedure Laterality Date  . Cataract extraction      left eye- 09/2014; righr eye- 08/13/2014  . Abdominal hysterectomy  1994    BSO    Her family history includes Hyperlipidemia in her sister; Hypertension in her sister; Lung cancer in her mother; Pancreatic cancer in her father.    Previous Medications   ACETAMINOPHEN (TYLENOL) 650 MG CR TABLET    Take 1,300 mg by mouth every 8 (eight) hours as needed for pain.   ALBUTEROL (PROAIR HFA) 108 (90 BASE) MCG/ACT INHALER    Inhale 2 puffs into the lungs every 6 (six) hours as needed.   ASPIRIN EC 81 MG TABLET    Take 81 mg by mouth daily.   FLUTICASONE (FLONASE) 50 MCG/ACT NASAL SPRAY    Place 1-2 sprays into the nose daily.   FLUTICASONE-SALMETEROL (ADVAIR DISKUS) 250-50 MCG/DOSE AEPB    Inhale 1 Inhaler into the lungs 2 (two) times daily.   GLUCOSAMINE-CHONDROIT-VIT C-MN (GLUCOSAMINE CHONDR 1500 COMPLX) CAPS    Take 1 capsule by mouth daily.   LOVASTATIN (MEVACOR) 20 MG TABLET    TAKE ONE TABLET BY MOUTH IN THE EVENING   MELOXICAM (MOBIC) 15 MG TABLET    TAKE ONE TABLET BY MOUTH ONCE DAILY AS NEEDED FOR ARTHRITIS PAIN   MULTIPLE VITAMIN PO  Take 1 tablet by mouth daily.   OMEGA-3 FATTY ACIDS (FISH OIL) 1200 MG CAPS    Take 2 capsules by mouth daily.   RED YEAST RICE 600 MG TABS    Take 2 tablets by mouth daily.   TRAMADOL (ULTRAM) 50 MG TABLET    Take 1 tablet (50 mg total) by mouth every 8 (eight) hours as needed.   TRIAMTERENE-HYDROCHLOROTHIAZIDE (MAXZIDE-25) 37.5-25 MG PER TABLET    Take 1 tablet by mouth daily.   VITAMIN E 400 UNITS TABS    Take 1 tablet by mouth daily.    Patient Care Team: Birdie Sons, MD as PCP - General (Family Medicine)     Objective:   Vitals: BP 138/80 mmHg  Pulse 80  Temp(Src) 97.8 F (36.6 C) (Oral)  Resp 16  Ht 5\' 1"  (1.549 m)  Wt 167 lb (75.751 kg)  BMI 31.57 kg/m2  SpO2 94%  Physical Exam   General Appearance:    Alert,  cooperative, no distress, appears stated age  Head:    Normocephalic, without obvious abnormality, atraumatic  Eyes:    PERRL, conjunctiva/corneas clear, EOM's intact, fundi    benign, both eyes  Ears:    Normal TM's and external ear canals, both ears  Nose:   Nares normal, septum midline, mucosa normal, no drainage    or sinus tenderness  Throat:   Lips, mucosa, and tongue normal; teeth and gums normal  Neck:   Supple, symmetrical, trachea midline, no adenopathy;    thyroid:  no enlargement/tenderness/nodules; no carotid   bruit or JVD   Back:     Symmetric, no curvature, ROM normal, no CVA tenderness  Lungs:     Clear to auscultation bilaterally, respirations unlabored  Chest Wall:    No tenderness or deformity   Heart:    Regular rate and rhythm, S1 and S2 normal, no murmur, rub   or gallop  Breast Exam:    normal appearance, no masses or tenderness  Abdomen:     Soft, non-tender, bowel sounds active all four quadrants,    no masses, no organomegaly  Pelvic:    deferred  Extremities:   Extremities normal, atraumatic, no cyanosis or edema  Pulses:   2+ and symmetric all extremities  Skin:   Skin color, texture, turgor normal, no rashes or lesions  Lymph nodes:   Cervical, supraclavicular, and axillary nodes normal  Neurologic:   CNII-XII intact, normal strength, sensation and reflexes    throughout    Activities of Daily Living In your present state of health, do you have any difficulty performing the following activities: 05/17/2015  Hearing? N  Vision? N  Difficulty concentrating or making decisions? N  Walking or climbing stairs? Y  Dressing or bathing? N  Doing errands, shopping? N    Fall Risk Assessment Fall Risk  05/17/2015  Falls in the past year? No     Depression Screen PHQ 2/9 Scores 05/17/2015  PHQ - 2 Score 0    Cognitive Testing - 6-CIT  Correct? Score   What year is it? yes 0 0 or 4  What month is it? yes 0 0 or 3  Memorize:    Pia Mau,  44,   [place],      What time is it? (within 1 hour) yes 0 0 or 3  Count backwards from 20 yes 0 0, 2, or 4  Name the months of the year yes 0 0, 2, or 4  Repeat name & address above no 3 0, 2, 4, 6, 8, or 10       TOTAL SCORE  3/28   Interpretation:  Normal  Normal (0-7) Abnormal (8-28)    Audit-C Alcohol Use Screening  Question Answer Points  How often do you have alcoholic drink? 2-3 times weekly 3  On days you do drink alcohol, how many drinks do you typically consume? 1 or 2 0  How oftey will you drink 6 or more in a total? never 0  Total Score:  3   A score of 3 or more in women, and 4 or more in men indicates increased risk for alcohol abuse, EXCEPT if all of the points are from question 1.    Assessment & Plan:     Yearly Wellness Visit  Reviewed patient's Family Medical History Reviewed and updated list of patient's medical providers Assessment of cognitive impairment was done Assessed patient's functional ability Established a written schedule for health screening [place] Completed and Reviewed  Exercise Activities and Dietary recommendations Goals    None      Immunization History  Administered Date(s) Administered  . Pneumococcal Polysaccharide-23 04/12/2008, 09/12/2013  . Tdap 05/03/2007  . Zoster 06/12/2009    Health Maintenance  Topic Date Due  . PNA vac Low Risk Adult (2 of 2 - PCV13) 09/13/2014  . INFLUENZA VACCINE  02/05/2015  . COLONOSCOPY  03/28/2015  . MAMMOGRAM  08/07/2016  . TETANUS/TDAP  05/02/2017  . DEXA SCAN  Completed  . ZOSTAVAX  Completed      Discussed health benefits of physical activity, and encouraged her to engage in regular exercise appropriate for her age and condition.    Results for orders placed or performed in visit on 05/17/15  POCT HgB A1C  Result Value Ref Range   Hemoglobin A1C 6.3    Est. average glucose Bld gHb Est-mCnc 134      ------------------------------------------------------------------------------------------------------------  1. Annual physical exam Doing well.   2. Prediabetes Well controlled with diet. Check a1c every 6-12 months.  - POCT HgB A1C  3. Asthma, mild intermittent, uncomplicated Recent flare due to allergies, will add singular for the next month or two. Call if not much better after starting singulair - montelukast (SINGULAIR) 10 MG tablet; Take 1 tablet (10 mg total) by mouth at bedtime.  Dispense: 30 tablet; Refill: 3  4. Allergic rhinitis, unspecified allergic rhinitis type Has done well with flonase in the past and will add singulair as above - fluticasone (FLONASE) 50 MCG/ACT nasal spray; Place 1-2 sprays into both nostrils daily.  Dispense: 16 g; Refill: 3 - montelukast (SINGULAIR) 10 MG tablet; Take 1 tablet (10 mg total) by mouth at bedtime.  Dispense: 30 tablet; Refill: 3  5. Essential (primary) hypertension Well controlled.    6. Need for influenza vaccination  - Flu Vaccine QUAD 36+ mos IM  7. Skin lesion She has a small 61mm macular flesh colored lesion just anterior to her right ear that just popped up a few days ago. It is slightly sore. Advised to apply neosporin daily and call if it does not go away within two weeks.

## 2015-05-17 NOTE — Progress Notes (Deleted)
       Patient: Destiny Gilbert Female    DOB: [birth year]   74 y.o.   MRN: [id number] Visit Date: 05/17/2015  Today's Provider: Lelon Huh, MD   No chief complaint on file.  Subjective:    HPI     Allergies  Allergen Reactions  . Sulfa Antibiotics Nausea And Vomiting   Previous Medications   ALBUTEROL (PROAIR HFA) 108 (90 BASE) MCG/ACT INHALER    Inhale 2 puffs into the lungs every 6 (six) hours as needed.   ASPIRIN EC 81 MG TABLET    Take 81 mg by mouth daily.   FLUTICASONE (FLONASE) 50 MCG/ACT NASAL SPRAY    Place 1-2 sprays into the nose daily.   FLUTICASONE-SALMETEROL (ADVAIR DISKUS) 250-50 MCG/DOSE AEPB    Inhale 1 Inhaler into the lungs 2 (two) times daily.   GLUCOSAMINE-CHONDROIT-VIT C-MN (GLUCOSAMINE CHONDR 1500 COMPLX) CAPS    Take 1 capsule by mouth daily.   LOVASTATIN (MEVACOR) 20 MG TABLET    TAKE ONE TABLET BY MOUTH IN THE EVENING   LOVASTATIN (MEVACOR) 40 MG TABLET    Take 1 tablet by mouth daily.   MELOXICAM (MOBIC) 15 MG TABLET    TAKE ONE TABLET BY MOUTH ONCE DAILY AS NEEDED FOR ARTHRITIS PAIN   MULTIPLE VITAMIN PO    Take 1 tablet by mouth daily.   RED YEAST RICE 600 MG TABS    Take 2 tablets by mouth daily.   TRAMADOL (ULTRAM) 50 MG TABLET    Take 1 tablet (50 mg total) by mouth every 8 (eight) hours as needed.   TRIAMTERENE-HYDROCHLOROTHIAZIDE (MAXZIDE-25) 37.5-25 MG PER TABLET    Take 1 tablet by mouth daily.   VITAMIN E 400 UNITS TABS    Take 1 tablet by mouth daily.    Review of Systems  Social History  Substance Use Topics  . Smoking status: Never Smoker   . Smokeless tobacco: Not on file  . Alcohol Use: 0.0 oz/week    0 Standard drinks or equivalent per week     Comment: has 3 ounces of wine each week   Objective:   There were no vitals taken for this visit.  Physical Exam      Assessment & Plan:           Lelon Huh, MD  [facility]

## 2015-05-21 ENCOUNTER — Telehealth: Payer: Self-pay | Admitting: Family Medicine

## 2015-05-21 DIAGNOSIS — J4520 Mild intermittent asthma, uncomplicated: Secondary | ICD-10-CM

## 2015-05-21 DIAGNOSIS — J309 Allergic rhinitis, unspecified: Secondary | ICD-10-CM

## 2015-05-21 MED ORDER — MONTELUKAST SODIUM 10 MG PO TABS: 10.0000 mg | ORAL_TABLET | Freq: Every day | ORAL | Status: DC

## 2015-05-21 NOTE — Telephone Encounter (Signed)
Pt says she was here last Thursday for a physical.  She said you talked about giving her a nose spray and singular.  She got the nose spray but they didn't have a prescripion for singular.  She uses [facility].  Her cal back is [phone]  Thanks Con Memos

## 2015-05-21 NOTE — Telephone Encounter (Signed)
Ok to resend Singulair? Looks like it was sent on our end. Please advise. Thanks!

## 2015-05-21 NOTE — Telephone Encounter (Signed)
done

## 2015-06-11 ENCOUNTER — Other Ambulatory Visit: Payer: Self-pay | Admitting: Family Medicine

## 2015-06-11 DIAGNOSIS — Z1231 Encounter for screening mammogram for malignant neoplasm of breast: Secondary | ICD-10-CM

## 2015-06-26 ENCOUNTER — Other Ambulatory Visit: Payer: Self-pay | Admitting: Family Medicine

## 2015-08-09 ENCOUNTER — Ambulatory Visit
Admission: RE | Admit: 2015-08-09 | Discharge: 2015-08-09 | Disposition: A | Discharge status: Home / Self Care | Payer: PPO | Source: Ambulatory Visit | Attending: Family Medicine | Admitting: Family Medicine

## 2015-08-09 DIAGNOSIS — Z1231 Encounter for screening mammogram for malignant neoplasm of breast: Secondary | ICD-10-CM | POA: Diagnosis not present

## 2015-11-07 ENCOUNTER — Encounter: Payer: Self-pay | Admitting: Family Medicine

## 2015-11-07 ENCOUNTER — Ambulatory Visit (INDEPENDENT_AMBULATORY_CARE_PROVIDER_SITE_OTHER): Payer: PPO | Admitting: Family Medicine

## 2015-11-07 VITALS — BP 120/64 | HR 86 | Temp 98.0°F | Resp 18 | Wt 169.0 lb

## 2015-11-07 DIAGNOSIS — K529 Noninfective gastroenteritis and colitis, unspecified: Secondary | ICD-10-CM | POA: Diagnosis not present

## 2015-11-07 DIAGNOSIS — E782 Mixed hyperlipidemia: Secondary | ICD-10-CM | POA: Diagnosis not present

## 2015-11-07 DIAGNOSIS — R7303 Prediabetes: Secondary | ICD-10-CM

## 2015-11-07 DIAGNOSIS — I10 Essential (primary) hypertension: Secondary | ICD-10-CM

## 2015-11-07 NOTE — Progress Notes (Signed)
Patient: Destiny Gilbert Female    DOB: [birth year]   75 y.o.   MRN: [id number] Visit Date: 11/07/2015  Today's Provider: Lelon Huh, MD   Chief Complaint  Patient presents with  . Diarrhea   Subjective:    HPI Diarrhea:  Patient comes in stating yesterday morning she woke up with low grade fever (100), nausea and diarrhea. Symptoms started at 7am and lasted up until 1pm. Patient reports she took some OTC medication to help with nausea and slept the rest of the day. Today patient stating the diarrhea, fever and nausea have resolved. Patient does feel weak and has no energy. Patient has been able to eat and keep food and liquids down. Patient denies any vomiting or stomach pain.    Pre-Diabetes Mellitus Type II, Follow-up:   Lab Results  Component Value Date   HGBA1C 6.3 05/17/2015   HGBA1C 6.5* 10/25/2014   Last seen for pre-diabetes 6 months ago.  Management since then includes following low sugar diet. . She reports good compliance with treatment. She is not having side effects.   ------------------------------------------------------------------------   Hypertension, follow-up:  BP Readings from Last 3 Encounters:  11/07/15 120/64  05/17/15 138/80  03/21/15 138/84    She was last seen for hypertension 6 months ago.  BP at that visit was 138/80. Management since that visit includes continuing same medications.  .She reports good compliance with treatment. She is not having side effects.   ------------------------------------------------------------------------    Lipid/Cholesterol, Follow-up:   Last seen for this 6 months ago.  Management since that visit includes continuing same medications. .  Last Lipid Panel:    Component Value Date/Time   CHOL 224* 10/25/2014   TRIG 143 10/25/2014   HDL 67 10/25/2014   LDLCALC 128 10/25/2014    She reports excellent compliance with treatment. She is not having side effects.   Wt Readings from Last 3  Encounters:  11/07/15 169 lb (76.658 kg)  05/17/15 167 lb (75.751 kg)  03/21/15 169 lb (76.658 kg)    ------------------------------------------------------------------------       Allergies  Allergen Reactions  . Sulfa Antibiotics Nausea And Vomiting   Previous Medications   ACETAMINOPHEN (TYLENOL) 650 MG CR TABLET    Take 1,300 mg by mouth every 8 (eight) hours as needed for pain.   ADVAIR DISKUS 250-50 MCG/DOSE AEPB    INHALE ONE DOSE BY MOUTH TWICE DAILY   ALBUTEROL (PROAIR HFA) 108 (90 BASE) MCG/ACT INHALER    Inhale 2 puffs into the lungs every 6 (six) hours as needed.   ASPIRIN EC 81 MG TABLET    Take 81 mg by mouth daily.   FLUTICASONE (FLONASE) 50 MCG/ACT NASAL SPRAY    Place 1-2 sprays into both nostrils daily.   GLUCOSAMINE-CHONDROIT-VIT C-MN (GLUCOSAMINE CHONDR 1500 COMPLX) CAPS    Take 1 capsule by mouth daily.   IBUPROFEN (ADVIL,MOTRIN) 600 MG TABLET    Take 600 mg by mouth every 6 (six) hours as needed.   LOVASTATIN (MEVACOR) 20 MG TABLET    TAKE ONE TABLET BY MOUTH IN THE EVENING   MONTELUKAST (SINGULAIR) 10 MG TABLET    Take 1 tablet (10 mg total) by mouth at bedtime.   MULTIPLE VITAMIN PO    Take 1 tablet by mouth daily.   OMEGA-3 FATTY ACIDS (FISH OIL) 1200 MG CAPS    Take 2 capsules by mouth daily.   RED YEAST RICE 600 MG TABS    Take 2  tablets by mouth daily. Reported on 11/07/2015   TRAMADOL (ULTRAM) 50 MG TABLET    Take 1 tablet (50 mg total) by mouth every 8 (eight) hours as needed.   TRIAMTERENE-HYDROCHLOROTHIAZIDE (MAXZIDE-25) 37.5-25 MG PER TABLET    Take 1 tablet by mouth daily.   VITAMIN E 400 UNITS TABS    Take 1 tablet by mouth daily.    Review of Systems  Constitutional: Positive for fever (resolved), chills (resolved) and fatigue. Negative for appetite change.  Respiratory: Negative for chest tightness and shortness of breath.   Cardiovascular: Negative for chest pain and palpitations.  Gastrointestinal: Positive for nausea (resolved) and diarrhea  (resolved). Negative for vomiting, abdominal pain, blood in stool, abdominal distention and anal bleeding.  Neurological: Positive for weakness. Negative for dizziness.    Social History  Substance Use Topics  . Smoking status: Never Smoker   . Smokeless tobacco: Not on file  . Alcohol Use: 0.0 oz/week    0 Standard drinks or equivalent per week     Comment: has 3 ounces of wine each week   Objective:   BP 120/64 mmHg  Pulse 86  Temp(Src) 98 F (36.7 C) (Oral)  Resp 18  Wt 169 lb (76.658 kg)  SpO2 97%  Physical Exam  General Appearance:    Alert, cooperative, no distress  Eyes:    PERRL, conjunctiva/corneas clear, EOM's intact       Lungs:     Clear to auscultation bilaterally, respirations unlabored  Heart:    Regular rate and rhythm  Abdomen:   bowel sounds present and normal in all 4 quadrants. No CVA tenderness        Assessment & Plan:     1. Gastroenteritis Resolved. Call if sympotms return.   2. Pre-diabetes Doing well with diet.  - Hemoglobin A1c  3. Essential (primary) hypertension Well controlled.  Continue current medications.   - Renal function panel  4. Mixed hyperlipidemia She is tolerating lovastatin well with no adverse effects.   - Hepatic function panel - Lipid panel        Lelon Huh, MD  [facility]

## 2015-11-14 ENCOUNTER — Ambulatory Visit: Payer: PPO | Admitting: Family Medicine

## 2015-11-15 DIAGNOSIS — E782 Mixed hyperlipidemia: Secondary | ICD-10-CM | POA: Diagnosis not present

## 2015-11-15 DIAGNOSIS — R7303 Prediabetes: Secondary | ICD-10-CM | POA: Diagnosis not present

## 2015-11-15 DIAGNOSIS — I10 Essential (primary) hypertension: Secondary | ICD-10-CM | POA: Diagnosis not present

## 2015-11-16 ENCOUNTER — Encounter: Payer: Self-pay | Admitting: Family Medicine

## 2015-11-16 ENCOUNTER — Telehealth: Payer: Self-pay

## 2015-11-16 DIAGNOSIS — E119 Type 2 diabetes mellitus without complications: Secondary | ICD-10-CM

## 2015-11-16 LAB — RENAL FUNCTION PANEL
ALBUMIN: 4.2 g/dL (ref 3.5–4.8)
BUN / CREAT RATIO: 15 (ref 12–28)
BUN: 19 mg/dL (ref 8–27)
CHLORIDE: 103 mmol/L (ref 96–106)
CO2: 27 mmol/L (ref 18–29)
Calcium: 9.7 mg/dL (ref 8.7–10.3)
Creatinine, Ser: 1.27 mg/dL — ABNORMAL HIGH (ref 0.57–1.00)
GFR, EST AFRICAN AMERICAN: 48 mL/min/{1.73_m2} — AB (ref >59)
GFR, EST NON AFRICAN AMERICAN: 42 mL/min/{1.73_m2} — AB (ref >59)
GLUCOSE: 106 mg/dL — AB (ref 65–99)
POTASSIUM: 5 mmol/L (ref 3.5–5.2)
Phosphorus: 2.9 mg/dL (ref 2.5–4.5)
SODIUM: 143 mmol/L (ref 134–144)

## 2015-11-16 LAB — HEPATIC FUNCTION PANEL
ALT: 17 IU/L (ref 0–32)
AST: 25 IU/L (ref 0–40)
Alkaline Phosphatase: 61 IU/L (ref 39–117)
BILIRUBIN TOTAL: 0.3 mg/dL (ref 0.0–1.2)
BILIRUBIN, DIRECT: 0.1 mg/dL (ref 0.00–0.40)
TOTAL PROTEIN: 6.8 g/dL (ref 6.0–8.5)

## 2015-11-16 LAB — LIPID PANEL
CHOL/HDL RATIO: 3.5 ratio (ref 0.0–4.4)
Cholesterol, Total: 180 mg/dL (ref 100–199)
HDL: 51 mg/dL (ref >39)
LDL Calculated: 94 mg/dL (ref 0–99)
Triglycerides: 173 mg/dL — ABNORMAL HIGH (ref 0–149)
VLDL CHOLESTEROL CAL: 35 mg/dL (ref 5–40)

## 2015-11-16 LAB — HEMOGLOBIN A1C
Est. average glucose Bld gHb Est-mCnc: 146 mg/dL
HEMOGLOBIN A1C: 6.7 % — AB (ref 4.8–5.6)

## 2015-11-16 MED ORDER — METFORMIN HCL ER 500 MG PO TB24: 500.0000 mg | ORAL_TABLET | Freq: Every day | ORAL | Status: DC

## 2015-11-16 NOTE — Telephone Encounter (Signed)
Patient advised of lab results and verbally voiced understanding. Prescription sent into pharmacy. Follow up appointment scheduled 03/18/2016 at 8:15am.

## 2015-11-16 NOTE — Telephone Encounter (Signed)
-----   Message from Birdie Sons, MD sent at 11/16/2015  8:20 AM EDT ----- A1c up a little bit to 6.7 which is in diabetic range. Kidney functions have declined slightly. Cholesterol is good at 180. Need to drink more water. Need to start metformin ER 500mg  daily for blood sugar. Follow up 4 months.

## 2015-12-26 ENCOUNTER — Telehealth: Payer: Self-pay | Admitting: Family Medicine

## 2015-12-26 MED ORDER — ONETOUCH ULTRA 2 W/DEVICE KIT: PACK | Status: AC

## 2015-12-26 NOTE — Telephone Encounter (Signed)
Pt wants to know if you can prescribe a glucometer for her to be able to test her blood sugars at home.  She uses [facility]  Her call back is [phone]  Thanks, Con Memos

## 2015-12-26 NOTE — Telephone Encounter (Signed)
Patient was notified.

## 2015-12-26 NOTE — Telephone Encounter (Signed)
Have sent prescription to [address]

## 2015-12-26 NOTE — Telephone Encounter (Signed)
Pease advise.

## 2015-12-27 ENCOUNTER — Other Ambulatory Visit: Payer: Self-pay | Admitting: Family Medicine

## 2015-12-27 NOTE — Telephone Encounter (Signed)
Pt picked up the glucose monitor but there were not strips sent in.  Pharmacist told her to contact our office and request One touch ultra 2 test strips sent to [facility] on [address].

## 2015-12-28 ENCOUNTER — Other Ambulatory Visit: Payer: Self-pay | Admitting: Family Medicine

## 2015-12-28 MED ORDER — GLUCOSE BLOOD VI STRP: ORAL_STRIP | Status: DC

## 2015-12-28 MED ORDER — GLUCOSE BLOOD VI STRP: 1.0000 | ORAL_STRIP | Status: DC | PRN

## 2016-01-07 ENCOUNTER — Telehealth: Payer: Self-pay | Admitting: Family Medicine

## 2016-01-07 NOTE — Telephone Encounter (Signed)
Those are fairly good blood sugars. Her fasting sugar in the morning should be below 140, and should stay below 200 at any other time so long as her hga1c is good. She should continue current dose of metformin, and she should let us know if she starts seeing sugars consistently over 200.

## 2016-01-07 NOTE — Telephone Encounter (Signed)
Pt calling with a concern about her blood levels which are the following. Pt would like a return call about this to see if she is doing right with everything or would like to know if maybe her machine is working or if it needs checking out. Thanks CC  Saturday am (before food)148  Sunday am (before food) 149  today  01/07/16 (with no food 3 hours) 165

## 2016-01-07 NOTE — Telephone Encounter (Signed)
Please advise 

## 2016-01-09 NOTE — Telephone Encounter (Signed)
Patient was notified. Patient expressed understanding.  

## 2016-01-18 ENCOUNTER — Other Ambulatory Visit: Payer: Self-pay | Admitting: Family Medicine

## 2016-01-18 DIAGNOSIS — J309 Allergic rhinitis, unspecified: Secondary | ICD-10-CM

## 2016-02-18 ENCOUNTER — Other Ambulatory Visit: Payer: Self-pay | Admitting: Family Medicine

## 2016-02-25 ENCOUNTER — Other Ambulatory Visit: Payer: Self-pay | Admitting: Family Medicine

## 2016-03-13 ENCOUNTER — Other Ambulatory Visit: Payer: Self-pay | Admitting: Family Medicine

## 2016-03-13 DIAGNOSIS — E119 Type 2 diabetes mellitus without complications: Secondary | ICD-10-CM

## 2016-03-18 ENCOUNTER — Ambulatory Visit (INDEPENDENT_AMBULATORY_CARE_PROVIDER_SITE_OTHER): Payer: PPO | Admitting: Family Medicine

## 2016-03-18 ENCOUNTER — Encounter: Payer: Self-pay | Admitting: Family Medicine

## 2016-03-18 VITALS — BP 120/70 | HR 73 | Temp 98.7°F | Resp 16 | Ht 61.0 in | Wt 156.0 lb

## 2016-03-18 DIAGNOSIS — Z23 Encounter for immunization: Secondary | ICD-10-CM

## 2016-03-18 DIAGNOSIS — I10 Essential (primary) hypertension: Secondary | ICD-10-CM | POA: Diagnosis not present

## 2016-03-18 DIAGNOSIS — J4520 Mild intermittent asthma, uncomplicated: Secondary | ICD-10-CM | POA: Diagnosis not present

## 2016-03-18 DIAGNOSIS — E119 Type 2 diabetes mellitus without complications: Secondary | ICD-10-CM

## 2016-03-18 LAB — POCT GLYCOSYLATED HEMOGLOBIN (HGB A1C)
ESTIMATED AVERAGE GLUCOSE: 128
HEMOGLOBIN A1C: 6.1

## 2016-03-18 NOTE — Patient Instructions (Signed)
Diabetes Mellitus and Food It is important for you to manage your blood sugar (glucose) level. Your blood glucose level can be greatly affected by what you eat. Eating healthier foods in the appropriate amounts throughout the day at about the same time each day will help you control your blood glucose level. It can also help slow or prevent worsening of your diabetes mellitus. Healthy eating may even help you improve the level of your blood pressure and reach or maintain a healthy weight.  General recommendations for healthful eating and cooking habits include:  Eating meals and snacks regularly. Avoid going long periods of time without eating to lose weight.  Eating a diet that consists mainly of plant-based foods, such as fruits, vegetables, nuts, legumes, and whole grains.  Using low-heat cooking methods, such as baking, instead of high-heat cooking methods, such as deep frying. Work with your dietitian to make sure you understand how to use the Nutrition Facts information on food labels. HOW CAN FOOD AFFECT ME? Carbohydrates Carbohydrates affect your blood glucose level more than any other type of food. Your dietitian will help you determine how many carbohydrates to eat at each meal and teach you how to count carbohydrates. Counting carbohydrates is important to keep your blood glucose at a healthy level, especially if you are using insulin or taking certain medicines for diabetes mellitus. Alcohol Alcohol can cause sudden decreases in blood glucose (hypoglycemia), especially if you use insulin or take certain medicines for diabetes mellitus. Hypoglycemia can be a life-threatening condition. Symptoms of hypoglycemia (sleepiness, dizziness, and disorientation) are similar to symptoms of having too much alcohol.  If your health care provider has given you approval to drink alcohol, do so in moderation and use the following guidelines:  Women should not have more than one drink per day, and men  should not have more than two drinks per day. One drink is equal to:  12 oz of beer.  5 oz of wine.  1 oz of hard liquor.  Do not drink on an empty stomach.  Keep yourself hydrated. Have water, diet soda, or unsweetened iced tea.  Regular soda, juice, and other mixers might contain a lot of carbohydrates and should be counted. WHAT FOODS ARE NOT RECOMMENDED? As you make food choices, it is important to remember that all foods are not the same. Some foods have fewer nutrients per serving than other foods, even though they might have the same number of calories or carbohydrates. It is difficult to get your body what it needs when you eat foods with fewer nutrients. Examples of foods that you should avoid that are high in calories and carbohydrates but low in nutrients include:  Trans fats (most processed foods list trans fats on the Nutrition Facts label).  Regular soda.  Juice.  Candy.  Sweets, such as cake, pie, doughnuts, and cookies.  Fried foods. WHAT FOODS CAN I EAT? Eat nutrient-rich foods, which will nourish your body and keep you healthy. The food you should eat also will depend on several factors, including:  The calories you need.  The medicines you take.  Your weight.  Your blood glucose level.  Your blood pressure level.  Your cholesterol level. You should eat a variety of foods, including:  Protein.  Lean cuts of meat.  Proteins low in saturated fats, such as fish, egg whites, and beans. Avoid processed meats.  Fruits and vegetables.  Fruits and vegetables that may help control blood glucose levels, such as apples, mangoes, and   yams.  Dairy products.  Choose fat-free or low-fat dairy products, such as milk, yogurt, and cheese.  Grains, bread, pasta, and rice.  Choose whole grain products, such as multigrain bread, whole oats, and brown rice. These foods may help control blood pressure.  Fats.  Foods containing healthful fats, such as nuts,  avocado, olive oil, canola oil, and fish. DOES EVERYONE WITH DIABETES MELLITUS HAVE THE SAME MEAL PLAN? Because every person with diabetes mellitus is different, there is not one meal plan that works for everyone. It is very important that you meet with a dietitian who will help you create a meal plan that is just right for you.   This information is not intended to replace advice given to you by your health care provider. Make sure you discuss any questions you have with your health care provider.   Document Released: 03/20/2005 Document Revised: 07/14/2014 Document Reviewed: 05/20/2013 Elsevier Interactive Patient Education 2016 Else[employer] Inc[employer]  

## 2016-03-18 NOTE — Progress Notes (Signed)
Patient: Destiny Gilbert Female    DOB: [birth year]   75 y.o.   MRN: [id number] Visit Date: 03/18/2016  Today's Provider: Lelon Huh, MD   Chief Complaint  Patient presents with  . Follow-up  . Diabetes  . Hypertension   Subjective:    HPI   Diabetes Mellitus Type II, Follow-up:   Lab Results  Component Value Date   HGBA1C 6.7 (H) 11/15/2015   HGBA1C 6.3 05/17/2015   HGBA1C 6.5 (A) 10/25/2014   Last seen for diabetes 4 months ago.  Management since then includes; started metformin Er 500 mg qd. She reports good compliance with treatment. She is not having side effects. none Current symptoms include none and have been unchanged. Home blood sugar records: fasting range: 120-149  Episodes of hypoglycemia? no   Current Insulin Regimen: n/a Most Recent Eye Exam: due Weight trend: stable Prior visit with dietician: no Current diet: well balanced Current exercise: walking  ----------------------------------------------------------------  Hypertension, follow-up:  BP Readings from Last 3 Encounters:  03/18/16 120/70  11/07/15 120/64  05/17/15 138/80    She was last seen for hypertension 4 months ago.  BP at that visit was 120/64. Management since that visit includes; checked labs. Showed kidney functions have declined slightly, advised to drink more water.She reports good compliance with treatment. She is not having side effects. none She is exercising. She is adherent to low salt diet.   Outside blood pressures are n/a. She is experiencing none.  Patient denies none.   Cardiovascular risk factors include diabetes mellitus.  Use of agents associated with hypertension: none.   ----------------------------------------------------------------  States is using Advair every day. Not requiring rescue inhalers.No difficulty breathing Using Singulair at night and mucinex due to allergies worsening and is tolerating regiment well.   Allergies  Allergen  Reactions  . Sulfa Antibiotics Nausea And Vomiting     Current Outpatient Prescriptions:  .  acetaminophen (TYLENOL) 500 MG tablet, Take 500 mg by mouth every 6 (six) hours as needed., Disp: , Rfl:  .  ADVAIR DISKUS 250-50 MCG/DOSE AEPB, INHALE ONE DOSE BY MOUTH TWICE DAILY, Disp: 60 each, Rfl: 12 .  albuterol (PROAIR HFA) 108 (90 BASE) MCG/ACT inhaler, Inhale 2 puffs into the lungs every 6 (six) hours as needed., Disp: , Rfl:  .  aspirin EC 81 MG tablet, Take 81 mg by mouth daily., Disp: , Rfl:  .  fluticasone (FLONASE) 50 MCG/ACT nasal spray, Place 1-2 sprays into both nostrils daily., Disp: 16 g, Rfl: 3 .  glucose blood (ONE TOUCH ULTRA TEST) test strip, Use to check blood sugar daily, Disp: 100 each, Rfl: 12 .  GuaiFENesin (MUCUS RELIEF ADULT PO), Take by mouth., Disp: , Rfl:  .  ibuprofen (ADVIL,MOTRIN) 600 MG tablet, Take 600 mg by mouth every 6 (six) hours as needed., Disp: , Rfl:  .  lovastatin (MEVACOR) 20 MG tablet, TAKE ONE TABLET BY MOUTH IN THE EVENING, Disp: 90 tablet, Rfl: 4 .  metFORMIN (GLUCOPHAGE-XR) 500 MG 24 hr tablet, TAKE ONE TABLET BY MOUTH ONCE DAILY FOR  BLOOD  SUGAR, Disp: 30 tablet, Rfl: 12 .  montelukast (SINGULAIR) 10 MG tablet, TAKE ONE TABLET BY MOUTH AT BEDTIME, Disp: 90 tablet, Rfl: 4 .  MULTIPLE VITAMIN PO, Take 1 tablet by mouth daily., Disp: , Rfl:  .  Omega-3 Fatty Acids (FISH OIL) 1200 MG CAPS, Take 2 capsules by mouth daily., Disp: , Rfl:  .  Red Yeast Rice 600 MG TABS,  Take 2 tablets by mouth daily. Reported on 11/07/2015, Disp: , Rfl:  .  traMADol (ULTRAM) 50 MG tablet, Take 1 tablet (50 mg total) by mouth every 8 (eight) hours as needed., Disp: 30 tablet, Rfl: 0 .  triamterene-hydrochlorothiazide (MAXZIDE-25) 37.5-25 MG tablet, TAKE ONE TABLET BY MOUTH ONCE DAILY, Disp: 90 tablet, Rfl: 4 .  Vitamin E 400 UNITS TABS, Take 1 tablet by mouth daily., Disp: , Rfl:   Current Facility-Administered Medications:  .  methylPREDNISolone acetate (DEPO-MEDROL)  injection 40 mg, 40 mg, Intra-articular, Once, Mar Daring, PA-C  Review of Systems  Constitutional: Negative for appetite change, chills, fatigue and fever.  Respiratory: Negative for chest tightness and shortness of breath.   Cardiovascular: Negative for chest pain and palpitations.  Gastrointestinal: Negative for abdominal pain, nausea and vomiting.  Neurological: Negative for dizziness and weakness.    Social History  Substance Use Topics  . Smoking status: Never Smoker  . Smokeless tobacco: Not on file  . Alcohol use 0.0 oz/week     Comment: has 3 ounces of wine each week   Objective:   BP 120/70 (BP Location: Right Arm, Patient Position: Sitting, Cuff Size: Normal)   Pulse 73   Temp 98.7 F (37.1 C) (Oral)   Resp 16   Ht 5\' 1"  (1.549 m)   Wt 156 lb (70.8 kg)   SpO2 94%   BMI 29.48 kg/m   Physical Exam   General Appearance:    Alert, cooperative, no distress  Eyes:    PERRL, conjunctiva/corneas clear, EOM's intact       Lungs:     Rare expiratory wheeze, respirations unlabored  Heart:    Regular rate and rhythm  Neurologic:   Awake, alert, oriented x 3. No apparent focal neurological           defect.        Results for orders placed or performed in visit on 03/18/16  POCT glycosylated hemoglobin (Hb A1C)  Result Value Ref Range   Hemoglobin A1C 6.1    Est. average glucose Bld gHb Est-mCnc 128          Assessment & Plan:     1. Type 2 diabetes mellitus without complication, without long-term current use of insulin ([place]) Doing well with metformin. Continue current medications.   - POCT glycosylated hemoglobin (Hb A1C) - Renal function panel  2. Essential (primary) hypertension Well controlled.  Continue current medications.   - Renal function panel  3. . Asthma, mild intermittent, uncomplicated Well controlled on Advair  5. Need for influenza vaccination  - Flu vaccine HIGH DOSE PF  Return in about 6 months (around 09/15/2016).        Lelon Huh, MD  [facility]

## 2016-03-19 ENCOUNTER — Telehealth: Payer: Self-pay

## 2016-03-19 LAB — RENAL FUNCTION PANEL
Albumin: 4 g/dL (ref 3.5–4.8)
BUN / CREAT RATIO: 19 (ref 12–28)
BUN: 19 mg/dL (ref 8–27)
CO2: 27 mmol/L (ref 18–29)
Calcium: 10 mg/dL (ref 8.7–10.3)
Chloride: 104 mmol/L (ref 96–106)
Creatinine, Ser: 1.02 mg/dL — ABNORMAL HIGH (ref 0.57–1.00)
GFR, EST AFRICAN AMERICAN: 63 mL/min/{1.73_m2} (ref >59)
GFR, EST NON AFRICAN AMERICAN: 55 mL/min/{1.73_m2} — AB (ref >59)
GLUCOSE: 104 mg/dL — AB (ref 65–99)
POTASSIUM: 3.8 mmol/L (ref 3.5–5.2)
Phosphorus: 3.6 mg/dL (ref 2.5–4.5)
SODIUM: 146 mmol/L — AB (ref 134–144)

## 2016-03-19 NOTE — Telephone Encounter (Signed)
Patient advised as below. Patient verbalizes understanding and is in agreement with treatment plan.  

## 2016-03-19 NOTE — Telephone Encounter (Signed)
-----   Message from Birdie Sons, MD sent at 03/19/2016  7:43 AM EDT ----- Kidney functions are much better, nearly back to normal. Continue current medications.

## 2016-03-19 NOTE — Telephone Encounter (Signed)
Patient advised as below.  

## 2016-03-25 ENCOUNTER — Other Ambulatory Visit: Payer: Self-pay | Admitting: Family Medicine

## 2016-03-25 ENCOUNTER — Ambulatory Visit (INDEPENDENT_AMBULATORY_CARE_PROVIDER_SITE_OTHER): Payer: PPO | Admitting: Family Medicine

## 2016-03-25 ENCOUNTER — Encounter: Payer: Self-pay | Admitting: Family Medicine

## 2016-03-25 VITALS — BP 140/72 | HR 88 | Temp 98.4°F | Resp 16 | Wt 156.0 lb

## 2016-03-25 DIAGNOSIS — N3090 Cystitis, unspecified without hematuria: Secondary | ICD-10-CM

## 2016-03-25 MED ORDER — NITROFURANTOIN MONOHYD MACRO 100 MG PO CAPS: 100.0000 mg | ORAL_CAPSULE | Freq: Two times a day (BID) | ORAL | 0 refills | Status: DC

## 2016-03-25 NOTE — Progress Notes (Signed)
Subjective:     Patient ID: Destiny Gilbert, female   DOB: 06-19-1941, 75 y.o.   MRN: [id number]  HPI  Chief Complaint  Patient presents with  . Urinary Tract Infection    Started Sunday  Reports urinary frequency and burning. Symptoms improved with one AZO pill and increased fluid intake. Reports last UTI > 2 years ago.   Review of Systems  Constitutional: Negative for chills and fever.       Objective:   Physical Exam  Constitutional: She appears well-developed and well-nourished. No distress.  Genitourinary:  Genitourinary Comments: No CVA tenderness       Assessment:    1. Cystitis: aliquot of urine too small for both u/a and culture - Urine culture - nitrofurantoin, macrocrystal-monohydrate, (MACROBID) 100 MG capsule; Take 1 capsule (100 mg total) by mouth 2 (two) times daily.  Dispense: 14 capsule; Refill: 0    Plan:    Further f/u pending culture report

## 2016-03-25 NOTE — Patient Instructions (Signed)
We will call you with the culture results 

## 2016-03-26 ENCOUNTER — Telehealth: Payer: Self-pay | Admitting: Family Medicine

## 2016-03-26 ENCOUNTER — Other Ambulatory Visit: Payer: Self-pay | Admitting: Family Medicine

## 2016-03-26 DIAGNOSIS — N3090 Cystitis, unspecified without hematuria: Secondary | ICD-10-CM

## 2016-03-26 MED ORDER — CEPHALEXIN 500 MG PO CAPS: 500.0000 mg | ORAL_CAPSULE | Freq: Two times a day (BID) | ORAL | 0 refills | Status: DC

## 2016-03-26 NOTE — Telephone Encounter (Signed)
I have sent in [place] for her

## 2016-03-26 NOTE — Telephone Encounter (Signed)
Pt was in for OV with Bob on 03/25/16. Pt stated that she went to [facility] to pick up nitrofurantoin, macrocrystal-monohydrate, (MACROBID) 100 MG capsule yesterday but was advised the insurance requires prior auth and even if the PA gets approved it would be 50$. Pt stated she didn't want to pay 50$ for medication that she might not need once the culture results are in. Pt would like to see if she could try something a little cheaper. [place] Pt would like a call back for an update. Thanks TNP

## 2016-03-26 NOTE — Telephone Encounter (Signed)
Pt advised. Emily Drozdowski, CMA  

## 2016-03-26 NOTE — Telephone Encounter (Signed)
Please review and advise. KW 

## 2016-03-27 LAB — URINE CULTURE

## 2016-03-28 ENCOUNTER — Telehealth: Payer: Self-pay

## 2016-03-28 NOTE — Telephone Encounter (Signed)
-----   Message from Carmon Ginsberg, [place] sent at 03/28/2016  7:32 AM EDT ----- E.coli infection: continue cephalexin.

## 2016-03-28 NOTE — Telephone Encounter (Signed)
Patient was advised. KW 

## 2016-04-28 ENCOUNTER — Encounter: Payer: Self-pay | Admitting: Family Medicine

## 2016-04-28 ENCOUNTER — Ambulatory Visit (INDEPENDENT_AMBULATORY_CARE_PROVIDER_SITE_OTHER): Payer: PPO | Admitting: Family Medicine

## 2016-04-28 VITALS — BP 120/60 | HR 82 | Temp 98.8°F | Resp 16 | Wt 153.0 lb

## 2016-04-28 DIAGNOSIS — J069 Acute upper respiratory infection, unspecified: Secondary | ICD-10-CM | POA: Diagnosis not present

## 2016-04-28 DIAGNOSIS — B9789 Other viral agents as the cause of diseases classified elsewhere: Secondary | ICD-10-CM | POA: Diagnosis not present

## 2016-04-28 NOTE — Progress Notes (Signed)
Patient: Destiny Gilbert Female    DOB: [birth year]   75 y.o.   MRN: [id number] Visit Date: 04/28/2016  Today's Provider: Lelon Huh, MD   Chief Complaint  Patient presents with  . URI   Subjective:    URI   This is a new problem. Episode onset: 2 days ago. The problem has been gradually improving. There has been no fever. Associated symptoms include congestion, coughing (productive clear phlegm), headaches, a plugged ear sensation, rhinorrhea, sinus pain, a sore throat and swollen glands. Pertinent negatives include no abdominal pain, chest pain, diarrhea, dysuria, ear pain, joint pain, joint swelling, nausea, neck pain, rash, sneezing, vomiting or wheezing. She has tried antihistamine and acetaminophen for the symptoms. The treatment provided mild relief.  Feels improved today.     Allergies  Allergen Reactions  . Sulfa Antibiotics Nausea And Vomiting     Current Outpatient Prescriptions:  .  acetaminophen (TYLENOL) 500 MG tablet, Take 500 mg by mouth every 6 (six) hours as needed., Disp: , Rfl:  .  ADVAIR DISKUS 250-50 MCG/DOSE AEPB, INHALE ONE DOSE BY MOUTH TWICE DAILY, Disp: 60 each, Rfl: 12 .  albuterol (PROAIR HFA) 108 (90 BASE) MCG/ACT inhaler, Inhale 2 puffs into the lungs every 6 (six) hours as needed., Disp: , Rfl:  .  aspirin EC 81 MG tablet, Take 81 mg by mouth daily., Disp: , Rfl:  .  fluticasone (FLONASE) 50 MCG/ACT nasal spray, Place 1-2 sprays into both nostrils daily., Disp: 16 g, Rfl: 3 .  glucose blood (ONE TOUCH ULTRA TEST) test strip, Use to check blood sugar daily, Disp: 100 each, Rfl: 12 .  GuaiFENesin (MUCUS RELIEF ADULT PO), Take by mouth., Disp: , Rfl:  .  ibuprofen (ADVIL,MOTRIN) 600 MG tablet, Take 600 mg by mouth every 6 (six) hours as needed., Disp: , Rfl:  .  lovastatin (MEVACOR) 20 MG tablet, TAKE ONE TABLET BY MOUTH IN THE EVENING, Disp: 90 tablet, Rfl: 4 .  metFORMIN (GLUCOPHAGE-XR) 500 MG 24 hr tablet, TAKE ONE TABLET BY MOUTH ONCE DAILY  FOR  BLOOD  SUGAR, Disp: 30 tablet, Rfl: 12 .  montelukast (SINGULAIR) 10 MG tablet, TAKE ONE TABLET BY MOUTH AT BEDTIME, Disp: 90 tablet, Rfl: 4 .  MULTIPLE VITAMIN PO, Take 1 tablet by mouth daily., Disp: , Rfl:  .  Omega-3 Fatty Acids (FISH OIL) 1200 MG CAPS, Take 2 capsules by mouth daily., Disp: , Rfl:  .  Red Yeast Rice 600 MG TABS, Take 2 tablets by mouth daily. Reported on 11/07/2015, Disp: , Rfl:  .  traMADol (ULTRAM) 50 MG tablet, Take 1 tablet (50 mg total) by mouth every 8 (eight) hours as needed., Disp: 30 tablet, Rfl: 0 .  triamterene-hydrochlorothiazide (MAXZIDE-25) 37.5-25 MG tablet, TAKE ONE TABLET BY MOUTH ONCE DAILY, Disp: 90 tablet, Rfl: 4 .  Vitamin E 400 UNITS TABS, Take 1 tablet by mouth daily., Disp: , Rfl:   Review of Systems  Constitutional: Negative for appetite change, chills, diaphoresis, fatigue and fever.  HENT: Positive for congestion, postnasal drip, rhinorrhea, sinus pressure, sore throat and trouble swallowing. Negative for ear pain, mouth sores, nosebleeds and sneezing.   Eyes: Positive for discharge. Negative for photophobia, pain, redness, itching and visual disturbance.  Respiratory: Positive for cough (productive clear phlegm). Negative for chest tightness, shortness of breath and wheezing.   Cardiovascular: Negative for chest pain and palpitations.  Gastrointestinal: Negative for abdominal pain, diarrhea, nausea and vomiting.  Genitourinary: Negative for dysuria.  Musculoskeletal: Negative for joint pain and neck pain.  Skin: Negative for rash.  Neurological: Positive for headaches. Negative for dizziness and weakness.    Social History  Substance Use Topics  . Smoking status: Never Smoker  . Smokeless tobacco: Never Used  . Alcohol use 0.0 oz/week     Comment: has 3 ounces of wine each week   Objective:   BP 120/60 (BP Location: Left Arm, Patient Position: Sitting, Cuff Size: Normal)   Pulse 82   Temp 98.8 F (37.1 C) (Oral)   Resp 16   Wt  153 lb (69.4 kg)   SpO2 96% Comment: room air  BMI 28.91 kg/m   Physical Exam  General Appearance:    Alert, cooperative, no distress  HENT:   bilateral TM normal without fluid or infection, neck without nodes, throat normal without erythema or exudate, sinuses nontender, post nasal drip noted and nasal mucosa pale and congested  Eyes:    PERRL, conjunctiva/corneas clear, EOM's intact       Lungs:     Clear to auscultation bilaterally, respirations unlabored  Heart:    Regular rate and rhythm  Neurologic:   Awake, alert, oriented x 3. No apparent focal neurological           defect.            Assessment & Plan:     1. Viral upper respiratory tract infection Counseled regarding signs and symptoms of viral and bacterial respiratory infections. Advised to call or return for additional evaluation if he develops any sign of bacterial infection, or if current symptoms last longer than 10 days.         Lelon Huh, MD  [facility]

## 2016-05-20 ENCOUNTER — Encounter: Payer: Self-pay | Admitting: Family Medicine

## 2016-05-20 ENCOUNTER — Other Ambulatory Visit: Payer: Self-pay | Admitting: Family Medicine

## 2016-05-20 ENCOUNTER — Ambulatory Visit (INDEPENDENT_AMBULATORY_CARE_PROVIDER_SITE_OTHER): Payer: PPO | Admitting: Family Medicine

## 2016-05-20 VITALS — BP 124/62 | Temp 98.6°F | Resp 16 | Ht 62.0 in | Wt 153.0 lb

## 2016-05-20 DIAGNOSIS — I10 Essential (primary) hypertension: Secondary | ICD-10-CM | POA: Diagnosis not present

## 2016-05-20 DIAGNOSIS — Z1211 Encounter for screening for malignant neoplasm of colon: Secondary | ICD-10-CM | POA: Diagnosis not present

## 2016-05-20 DIAGNOSIS — Z0000 Encounter for general adult medical examination without abnormal findings: Secondary | ICD-10-CM

## 2016-05-20 DIAGNOSIS — Z1231 Encounter for screening mammogram for malignant neoplasm of breast: Secondary | ICD-10-CM

## 2016-05-20 DIAGNOSIS — Z23 Encounter for immunization: Secondary | ICD-10-CM | POA: Diagnosis not present

## 2016-05-20 NOTE — Progress Notes (Signed)
Patient: Destiny Gilbert, Female    DOB: 1940-09-16, 75 y.o.   MRN: [id number] Visit Date: 05/20/2016  Today's Provider: Lelon Huh, MD   Chief Complaint  Patient presents with  . Annual Exam   Subjective:    Annual physical Destiny Gilbert is a 75 y.o. female. She feels fairly well. She reports exercising occasionally. She reports she is sleeping well.    Hypertension, follow-up:  BP Readings from Last 3 Encounters:  05/20/16 124/62  04/28/16 120/60  03/25/16 140/72    She was last seen for hypertension 2 months ago.  BP at that visit was 120/70. Management since that visit includes no changes. She reports good compliance with treatment. She is not having side effects.  She is exercising. She is adherent to low salt diet.   Outside blood pressures are checked occasionally. Patient denies chest pain, dyspnea, lower extremity edema and paroxysmal nocturnal dyspnea.   Cardiovascular risk factors include diabetes mellitus.   Weight trend: stable Wt Readings from Last 3 Encounters:  05/20/16 153 lb (69.4 kg)  04/28/16 153 lb (69.4 kg)  03/25/16 156 lb (70.8 kg)    Current diet: well balanced    Diabetes Mellitus Type II, Follow-up:   Lab Results  Component Value Date   HGBA1C 6.1 03/18/2016   HGBA1C 6.7 (H) 11/15/2015   HGBA1C 6.3 05/17/2015    Last seen for diabetes 2 months ago.  Management since then includes no changes. She reports good compliance with treatment. She is not having side effects.  Current symptoms include none and have been stable.  Episodes of hypoglycemia? no   Current Insulin Regimen: none Most Recent Eye Exam: due Weight trend: stable Prior visit with dietician: no Current diet: well balanced Current exercise: walking  Pertinent Labs:    Component Value Date/Time   CHOL 180 11/15/2015 0815   TRIG 173 (H) 11/15/2015 0815   HDL 51 11/15/2015 0815   LDLCALC 94 11/15/2015 0815   CREATININE 1.02 (H) 03/18/2016 0936     CREATININE 1.18 09/13/2013 0421    Wt Readings from Last 3 Encounters:  05/20/16 153 lb (69.4 kg)  04/28/16 153 lb (69.4 kg)  03/25/16 156 lb (70.8 kg)         Review of Systems  Constitutional: Negative.   HENT: Negative.   Eyes: Negative.   Respiratory: Negative.   Cardiovascular: Negative.   Gastrointestinal: Negative.   Endocrine: Negative.   Genitourinary: Negative.   Musculoskeletal: Negative.   Skin: Negative.   Allergic/Immunologic: Negative.   Neurological: Negative.   Hematological: Negative.   Psychiatric/Behavioral: Negative.     Social History   Social History  . Marital status: Married    Spouse name: N/A  . Number of children: 3  . Years of education: N/A   Occupational History  . Retired    Social History Main Topics  . Smoking status: Never Smoker  . Smokeless tobacco: Never Used  . Alcohol use 0.0 oz/week     Comment: has 3 ounces of wine each week  . Drug use: No  . Sexual activity: Not on file   Other Topics Concern  . Not on file   Social History Narrative  . No narrative on file    Past Medical History:  Diagnosis Date  . History of peptic ulcer    perforated  . Pneumonia 01/23/2015   [facility]      Patient Active Problem List   Diagnosis Date Noted  .  Diabetes ([place]) 12/16/2009  . Asthma 12/21/2008  . Allergic rhinitis 04/16/2008  . Essential (primary) hypertension 05/03/2007  . Mixed hyperlipidemia 07/07/2006  . Peptic ulcer 07/08/2003  . Colon, diverticulosis 07/07/1998    Past Surgical History:  Procedure Laterality Date  . ABDOMINAL HYSTERECTOMY  1994   BSO  . CATARACT EXTRACTION     left eye- 09/2014; righr eye- 08/13/2014    Her family history includes Hyperlipidemia in her sister; Hypertension in her sister; Lung cancer in her mother; Pancreatic cancer in her father.     Current Meds  Medication Sig  . acetaminophen (TYLENOL) 500 MG tablet Take 500 mg by mouth every 6 (six) hours as needed.  Marland Kitchen ADVAIR  DISKUS 250-50 MCG/DOSE AEPB INHALE ONE DOSE BY MOUTH TWICE DAILY  . albuterol (PROAIR HFA) 108 (90 BASE) MCG/ACT inhaler Inhale 2 puffs into the lungs every 6 (six) hours as needed.  Marland Kitchen aspirin EC 81 MG tablet Take 81 mg by mouth daily.  . fluticasone (FLONASE) 50 MCG/ACT nasal spray Place 1-2 sprays into both nostrils daily.  Marland Kitchen glucose blood (ONE TOUCH ULTRA TEST) test strip Use to check blood sugar daily  . GuaiFENesin (MUCUS RELIEF ADULT PO) Take by mouth.  Marland Kitchen ibuprofen (ADVIL,MOTRIN) 600 MG tablet Take 600 mg by mouth every 6 (six) hours as needed.  . lovastatin (MEVACOR) 20 MG tablet TAKE ONE TABLET BY MOUTH IN THE EVENING  . metFORMIN (GLUCOPHAGE-XR) 500 MG 24 hr tablet TAKE ONE TABLET BY MOUTH ONCE DAILY FOR  BLOOD  SUGAR  . montelukast (SINGULAIR) 10 MG tablet TAKE ONE TABLET BY MOUTH AT BEDTIME  . MULTIPLE VITAMIN PO Take 1 tablet by mouth daily.  . Omega-3 Fatty Acids (FISH OIL) 1200 MG CAPS Take 2 capsules by mouth daily.  . Red Yeast Rice 600 MG TABS Take 2 tablets by mouth daily. Reported on 11/07/2015  . traMADol (ULTRAM) 50 MG tablet Take 1 tablet (50 mg total) by mouth every 8 (eight) hours as needed.  . triamterene-hydrochlorothiazide (MAXZIDE-25) 37.5-25 MG tablet TAKE ONE TABLET BY MOUTH ONCE DAILY  . Vitamin E 400 UNITS TABS Take 1 tablet by mouth daily.    Patient Care Team: Birdie Sons, MD as PCP - General (Family Medicine)     Objective:   Vitals: BP 124/62 (BP Location: Left Arm, Patient Position: Sitting, Cuff Size: Normal)   Temp 98.6 F (37 C)   Resp 16   Ht 5\' 2"  (1.575 m)   Wt 153 lb (69.4 kg)   BMI 27.98 kg/m    General Appearance:    Alert, cooperative, no distress, appears stated age  Head:    Normocephalic, without obvious abnormality, atraumatic  Eyes:    PERRL, conjunctiva/corneas clear, EOM's intact, fundi    benign, both eyes  Ears:    Normal TM's and external ear canals, both ears  Nose:   Nares normal, septum midline, mucosa normal, no  drainage    or sinus tenderness  Throat:   Lips, mucosa, and tongue normal; teeth and gums normal  Neck:   Supple, symmetrical, trachea midline, no adenopathy;    thyroid:  no enlargement/tenderness/nodules; no carotid   bruit or JVD  Back:     Symmetric, no curvature, ROM normal, no CVA tenderness  Lungs:     Clear to auscultation bilaterally, respirations unlabored  Chest Wall:    No tenderness or deformity   Heart:    Regular rate and rhythm, S1 and S2 normal, no murmur, rub  or gallop  Breast Exam:    normal appearance, no masses or tenderness  Abdomen:     Soft, non-tender, bowel sounds active all four quadrants,    no masses, no organomegaly  Pelvic:    deferred  Extremities:   Extremities normal, atraumatic, no cyanosis or edema  Pulses:   2+ and symmetric all extremities  Skin:   Skin color, texture, turgor normal, no rashes or lesions  Lymph nodes:   Cervical, supraclavicular, and axillary nodes normal  Neurologic:   CNII-XII intact, normal strength, sensation and reflexes    throughout    Physical Exam  Constitutional: She is oriented to person, place, and time. She appears well-developed and well-nourished.  HENT:  Head: Normocephalic and atraumatic.  Right Ear: External ear normal.  Left Ear: External ear normal.  Nose: Nose normal.  Mouth/Throat: Oropharynx is clear and moist.  Eyes: Conjunctivae and EOM are normal. Pupils are equal, round, and reactive to light.  Neck: Normal range of motion. Neck supple.  Cardiovascular: Normal rate, regular rhythm and normal heart sounds.   Pulmonary/Chest: Effort normal and breath sounds normal. Right breast exhibits no inverted nipple, no mass, no nipple discharge, no skin change and no tenderness. Left breast exhibits no inverted nipple, no mass, no nipple discharge, no skin change and no tenderness.  Abdominal: Soft. Bowel sounds are normal.  Musculoskeletal: Normal range of motion.  Neurological: She is alert and oriented to  person, place, and time.  Skin: Skin is warm and dry.  Psychiatric: She has a normal mood and affect. Her behavior is normal. Judgment and thought content normal.    Activities of Daily Living In your present state of health, do you have any difficulty performing the following activities: 05/20/2016  Hearing? N  Vision? N  Difficulty concentrating or making decisions? N  Walking or climbing stairs? N  Dressing or bathing? N  Doing errands, shopping? N  Some recent data might be hidden    Fall Risk Assessment Fall Risk  05/20/2016 05/17/2015  Falls in the past year? No No     Depression Screen PHQ 2/9 Scores 05/20/2016 05/17/2015  PHQ - 2 Score 0 0    Cognitive Testing - 6-CIT  Correct? Score   What year is it? yes 0 0 or 4  What month is it? yes 0 0 or 3  Memorize:    Pia Mau,  44,  [address],  [place],      What time is it? (within 1 hour) yes 0 0 or 3  Count backwards from 20 yes 0 0, 2, or 4  Name the months of the year yes 0 0, 2, or 4  Repeat name & address above yes 0 0, 2, 4, 6, 8, or 10       TOTAL SCORE  0/28   Interpretation:  Normal  Normal (0-7) Abnormal (8-28)       Assessment & Plan:     Annual Physical  Reviewed patient's Family Medical History Reviewed and updated list of patient's medical providers Assessment of cognitive impairment was done Assessed patient's functional ability Established a written schedule for health screening [place] Completed and Reviewed  Exercise Activities and Dietary recommendations Goals    None      Immunization History  Administered Date(s) Administered  . Influenza, High Dose Seasonal PF 03/18/2016  . Influenza,inj,Quad PF,36+ Mos 05/17/2015  . Pneumococcal Polysaccharide-23 04/12/2008, 09/12/2013  . Tdap 05/03/2007  . Zoster 06/12/2009  Health Maintenance  Topic Date Due  . FOOT EXAM  09/23/1952  . OPHTHALMOLOGY EXAM  09/23/1952  . URINE MICROALBUMIN  09/23/1952  .  PNA vac Low Risk Adult (2 of 2 - PCV13) 09/13/2014  . COLONOSCOPY  03/28/2015  . HEMOGLOBIN A1C  09/15/2016  . TETANUS/TDAP  05/02/2017  . MAMMOGRAM  08/08/2017  . DEXA SCAN  07/05/2019  . INFLUENZA VACCINE  Completed  . ZOSTAVAX  Completed     Discussed health benefits of physical activity, and encouraged her to engage in regular exercise appropriate for her age and condition.    ------------------------------------------------------------------------------------------------------------  1. Annual physical exam   2. Need for pneumococcal vaccination  - Pneumococcal conjugate vaccine 13-valent IM  3. Colon cancer screening  - Cologuard  4. Essential (primary) hypertension  - EKG 12-Lead   Lelon Huh, MD  [facility]

## 2016-05-23 ENCOUNTER — Telehealth: Payer: Self-pay | Admitting: Family Medicine

## 2016-05-23 NOTE — Telephone Encounter (Signed)
Order for cologuard faxed to [facility] °

## 2016-06-04 LAB — COLOGUARD

## 2016-08-12 ENCOUNTER — Other Ambulatory Visit: Payer: Self-pay | Admitting: Family Medicine

## 2016-08-12 ENCOUNTER — Ambulatory Visit
Admission: RE | Admit: 2016-08-12 | Discharge: 2016-08-12 | Disposition: A | Discharge status: Home / Self Care | Payer: PPO | Source: Ambulatory Visit | Attending: Family Medicine | Admitting: Family Medicine

## 2016-08-12 ENCOUNTER — Telehealth: Payer: Self-pay

## 2016-08-12 DIAGNOSIS — N644 Mastodynia: Secondary | ICD-10-CM

## 2016-08-12 DIAGNOSIS — Z1231 Encounter for screening mammogram for malignant neoplasm of breast: Secondary | ICD-10-CM

## 2016-08-12 NOTE — Telephone Encounter (Signed)
Destiny Gilbert with [facility] called because pt was there for routine screening mammo, but had new complaints of left breast pain which has been worsening. Destiny Gilbert is putting orders in for diagnostic bilateral mammogram, right and left breast US for left breast pain at 11 o'clock. Please sign orders. Thanks. Renaldo Fiddler, CMA

## 2016-08-13 ENCOUNTER — Encounter: Payer: Self-pay | Admitting: Family Medicine

## 2016-08-13 ENCOUNTER — Ambulatory Visit (INDEPENDENT_AMBULATORY_CARE_PROVIDER_SITE_OTHER): Payer: PPO | Admitting: Family Medicine

## 2016-08-13 VITALS — BP 140/76 | HR 72 | Temp 97.8°F | Resp 18 | Wt 158.0 lb

## 2016-08-13 DIAGNOSIS — R079 Chest pain, unspecified: Secondary | ICD-10-CM | POA: Diagnosis not present

## 2016-08-13 NOTE — Progress Notes (Signed)
Patient: Destiny Gilbert Female    DOB: 1940/11/23   76 y.o.   MRN: [id number] Visit Date: 08/13/2016  Today's Provider: Lelon Huh, MD   Chief Complaint  Patient presents with  . Breast Pain    x several weeks   Subjective:    HPI Breast Pain:  Patient comes in stating for the past several weeks she has had pain in her left breast. The pain worsened 2 days ago. Patient describes the pain as a sharp pain that radiates into the nipple. Patient had a diagnostic mammogram and ultrasound of the breast yesterday and the results were normal. Today the breast pain has improved and feels more like a pressure sensation in her left breast. Patient denies feeling any lumps, or nipple discharge.     Allergies  Allergen Reactions  . Sulfa Antibiotics Nausea And Vomiting     Current Outpatient Prescriptions:  .  acetaminophen (TYLENOL) 500 MG tablet, Take 500 mg by mouth every 6 (six) hours as needed., Disp: , Rfl:  .  ADVAIR DISKUS 250-50 MCG/DOSE AEPB, INHALE ONE DOSE BY MOUTH TWICE DAILY, Disp: 60 each, Rfl: 12 .  albuterol (PROAIR HFA) 108 (90 BASE) MCG/ACT inhaler, Inhale 2 puffs into the lungs every 6 (six) hours as needed., Disp: , Rfl:  .  aspirin EC 81 MG tablet, Take 81 mg by mouth daily., Disp: , Rfl:  .  fluticasone (FLONASE) 50 MCG/ACT nasal spray, Place 1-2 sprays into both nostrils daily., Disp: 16 g, Rfl: 3 .  glucose blood (ONE TOUCH ULTRA TEST) test strip, Use to check blood sugar daily, Disp: 100 each, Rfl: 12 .  GuaiFENesin (MUCUS RELIEF ADULT PO), Take by mouth., Disp: , Rfl:  .  ibuprofen (ADVIL,MOTRIN) 600 MG tablet, Take 600 mg by mouth every 6 (six) hours as needed., Disp: , Rfl:  .  lovastatin (MEVACOR) 20 MG tablet, TAKE ONE TABLET BY MOUTH IN THE EVENING, Disp: 90 tablet, Rfl: 4 .  metFORMIN (GLUCOPHAGE-XR) 500 MG 24 hr tablet, TAKE ONE TABLET BY MOUTH ONCE DAILY FOR  BLOOD  SUGAR, Disp: 30 tablet, Rfl: 12 .  montelukast (SINGULAIR) 10 MG tablet, TAKE ONE  TABLET BY MOUTH AT BEDTIME, Disp: 90 tablet, Rfl: 4 .  MULTIPLE VITAMIN PO, Take 1 tablet by mouth daily., Disp: , Rfl:  .  Omega-3 Fatty Acids (FISH OIL) 1200 MG CAPS, Take 2 capsules by mouth daily., Disp: , Rfl:  .  Red Yeast Rice 600 MG TABS, Take 2 tablets by mouth daily. Reported on 11/07/2015, Disp: , Rfl:  .  traMADol (ULTRAM) 50 MG tablet, Take 1 tablet (50 mg total) by mouth every 8 (eight) hours as needed., Disp: 30 tablet, Rfl: 0 .  triamterene-hydrochlorothiazide (MAXZIDE-25) 37.5-25 MG tablet, TAKE ONE TABLET BY MOUTH ONCE DAILY, Disp: 90 tablet, Rfl: 4 .  Vitamin E 400 UNITS TABS, Take 1 tablet by mouth daily., Disp: , Rfl:   Review of Systems  Constitutional: Negative for appetite change, chills, fatigue and fever.  Respiratory: Negative for chest tightness and shortness of breath.   Cardiovascular: Negative for chest pain and palpitations.  Gastrointestinal: Negative for abdominal pain, nausea and vomiting.  Musculoskeletal:       Breast pain  Neurological: Negative for dizziness and weakness.    Social History  Substance Use Topics  . Smoking status: Never Smoker  . Smokeless tobacco: Never Used  . Alcohol use 0.0 oz/week     Comment: has 3 ounces of  wine each week   Objective:   BP 140/76 (BP Location: Left Arm, Patient Position: Sitting, Cuff Size: Large)   Pulse 72   Temp 97.8 F (36.6 C) (Oral)   Resp 18   Wt 158 lb (71.7 kg)   SpO2 94% Comment: room air  BMI 28.90 kg/m   Physical Exam   General Appearance:    Alert, cooperative, no distress  Eyes:    PERRL, conjunctiva/corneas clear, EOM's intact       Lungs:     Clear to auscultation bilaterally, respirations unlabored  Heart:    Regular rate and rhythm  Neurologic:   Awake, alert, oriented x 3. No apparent focal neurological           defect.   MS:   Tender to deep palpation of left medial breast. No discrete masses    EKG: NSR, old anterior infarct    Assessment & Plan:     1. Chest pain,  unspecified type  - EKG 12-Lead - Troponin I - CK Total (and CKMB)  Suspect chest wall inflammation. Consider NSAID if cardiac labs are normal.       Lelon Huh, MD  [facility]

## 2016-08-14 ENCOUNTER — Telehealth: Payer: Self-pay

## 2016-08-14 LAB — CK TOTAL AND CKMB (NOT AT ARMC)
CK-MB Index: 2 ng/mL (ref 0.0–5.3)
Total CK: 89 U/L (ref 24–173)

## 2016-08-14 LAB — TROPONIN I: Troponin I: <0.01 ng/mL (ref 0.00–0.04)

## 2016-08-14 MED ORDER — DOXYCYCLINE HYCLATE 100 MG PO TABS: 100.0000 mg | ORAL_TABLET | Freq: Two times a day (BID) | ORAL | 0 refills | Status: DC

## 2016-08-14 MED ORDER — PREDNISONE 20 MG PO TABS: 20.0000 mg | ORAL_TABLET | Freq: Two times a day (BID) | ORAL | 0 refills | Status: DC

## 2016-08-14 NOTE — Telephone Encounter (Signed)
-----   Message from Birdie Sons, MD sent at 08/14/2016  7:57 AM EST ----- Cardiac tests are negative. Pain is likely due to inflammation in chest wall. Recommend prednisone 20mg  twice a day, #14, and doxycycline 100mg  twice a day for 7 days.

## 2016-08-14 NOTE — Telephone Encounter (Signed)
Advised patient as below. Medication was sent into the pharmacy.  

## 2016-08-23 ENCOUNTER — Other Ambulatory Visit: Payer: Self-pay | Admitting: Family Medicine

## 2016-09-11 ENCOUNTER — Encounter: Payer: Self-pay | Admitting: Family Medicine

## 2016-09-11 ENCOUNTER — Ambulatory Visit (INDEPENDENT_AMBULATORY_CARE_PROVIDER_SITE_OTHER): Payer: PPO | Admitting: Family Medicine

## 2016-09-11 VITALS — BP 124/70 | HR 83 | Temp 98.0°F | Resp 16 | Wt 159.0 lb

## 2016-09-11 DIAGNOSIS — I10 Essential (primary) hypertension: Secondary | ICD-10-CM | POA: Diagnosis not present

## 2016-09-11 DIAGNOSIS — R300 Dysuria: Secondary | ICD-10-CM

## 2016-09-11 DIAGNOSIS — S0011XA Contusion of right eyelid and periocular area, initial encounter: Secondary | ICD-10-CM | POA: Diagnosis not present

## 2016-09-11 DIAGNOSIS — E119 Type 2 diabetes mellitus without complications: Secondary | ICD-10-CM | POA: Diagnosis not present

## 2016-09-11 DIAGNOSIS — Z1211 Encounter for screening for malignant neoplasm of colon: Secondary | ICD-10-CM

## 2016-09-11 LAB — POCT URINALYSIS DIPSTICK
Bilirubin, UA: NEGATIVE
Glucose, UA: NEGATIVE
KETONES UA: NEGATIVE
Nitrite, UA: NEGATIVE
PROTEIN UA: NEGATIVE
Urobilinogen, UA: 0.2
pH, UA: 8

## 2016-09-11 LAB — POCT UA - MICROALBUMIN: Microalbumin Ur, POC: NEGATIVE mg/L

## 2016-09-11 LAB — POCT GLYCOSYLATED HEMOGLOBIN (HGB A1C)
Est. average glucose Bld gHb Est-mCnc: 131
Hemoglobin A1C: 6.2

## 2016-09-11 NOTE — Progress Notes (Signed)
Patient: Destiny Gilbert Female    DOB: 04-22-1941   76 y.o.   MRN: [id number] Visit Date: 09/11/2016  Today's Provider: Lelon Huh, MD   Chief Complaint  Patient presents with  . Bleeding/Bruising  . Urinary Urgency   Subjective:    HPI Bruising of the right eye:  Patient presents today reporting that she woke up this morning with bruising around her right eye. Patient denies any trauma to the eye. She does have some soreness in her left eye.   Urinary Urgency:  Patient reports that 4 days ago she experienced some burning and urinary urgency. Symptoms resolved after taking one dose of AZO. Patient would like her urine checked for possible UTI.    Diabetes Mellitus Type II, Follow-up:   Lab Results  Component Value Date   HGBA1C 6.1 03/18/2016   HGBA1C 6.7 (H) 11/15/2015   HGBA1C 6.3 05/17/2015    Last seen for diabetes 6 months ago.  Management since then includes no changes. She reports good compliance with treatment. She is not having side effects.  Current symptoms include none and have been stable. Home blood sugar records: fasting range: 112-120  Episodes of hypoglycemia? no   Current Insulin Regimen: none Most Recent Eye Exam: 1 year ago Weight trend: fluctuating a bit Prior visit with dietician: no Current diet: in general, a "healthy" diet   Current exercise: none  Pertinent Labs:    Component Value Date/Time   CHOL 180 11/15/2015 0815   TRIG 173 (H) 11/15/2015 0815   HDL 51 11/15/2015 0815   LDLCALC 94 11/15/2015 0815   CREATININE 1.02 (H) 03/18/2016 0936   CREATININE 1.18 09/13/2013 0421    Wt Readings from Last 3 Encounters:  09/11/16 159 lb (72.1 kg)  08/13/16 158 lb (71.7 kg)  05/20/16 153 lb (69.4 kg)    ------------------------------------------------------------------------  Hypertension, follow-up:  BP Readings from Last 3 Encounters:  09/11/16 124/70  08/13/16 140/76  05/20/16 124/62    She was last seen for  hypertension 4 months ago.  BP at that visit was 124/62. Management since that visit includes no changes. She reports good compliance with treatment. She is not having side effects.  She is not exercising. She is adherent to low salt diet.   Outside blood pressures are 944-967 (systolic); patient unsure of the diastolic reading. She is experiencing chest pressure/discomfort.  Patient denies claudication, dyspnea, fatigue, irregular heart beat, lower extremity edema, near-syncope, orthopnea, palpitations, paroxysmal nocturnal dyspnea, syncope and tachypnea.   Cardiovascular risk factors include advanced age (older than 68 for men, 20 for women), diabetes mellitus, hypertension and sedentary lifestyle.  Use of agents associated with hypertension: NSAIDS.     Weight trend: fluctuating a bit Wt Readings from Last 3 Encounters:  09/11/16 159 lb (72.1 kg)  08/13/16 158 lb (71.7 kg)  05/20/16 153 lb (69.4 kg)    Current diet: in general, a "healthy" diet    ------------------------------------------------------------------------     Allergies  Allergen Reactions  . Sulfa Antibiotics Nausea And Vomiting     Current Outpatient Prescriptions:  .  acetaminophen (TYLENOL) 500 MG tablet, Take 500 mg by mouth every 6 (six) hours as needed., Disp: , Rfl:  .  ADVAIR DISKUS 250-50 MCG/DOSE AEPB, INHALE ONE DOSE BY MOUTH TWICE DAILY, Disp: 60 each, Rfl: 12 .  albuterol (PROAIR HFA) 108 (90 BASE) MCG/ACT inhaler, Inhale 2 puffs into the lungs every 6 (six) hours as needed., Disp: , Rfl:  .  aspirin EC 81 MG tablet, Take 81 mg by mouth daily., Disp: , Rfl:  .  glucose blood (ONE TOUCH ULTRA TEST) test strip, Use to check blood sugar daily, Disp: 100 each, Rfl: 12 .  ibuprofen (ADVIL,MOTRIN) 600 MG tablet, Take 600 mg by mouth every 6 (six) hours as needed., Disp: , Rfl:  .  lovastatin (MEVACOR) 20 MG tablet, TAKE ONE TABLET BY MOUTH IN THE EVENING, Disp: 90 tablet, Rfl: 4 .  metFORMIN  (GLUCOPHAGE-XR) 500 MG 24 hr tablet, TAKE ONE TABLET BY MOUTH ONCE DAILY FOR  BLOOD  SUGAR, Disp: 30 tablet, Rfl: 12 .  montelukast (SINGULAIR) 10 MG tablet, TAKE ONE TABLET BY MOUTH AT BEDTIME, Disp: 90 tablet, Rfl: 4 .  MULTIPLE VITAMIN PO, Take 1 tablet by mouth daily., Disp: , Rfl:  .  Omega-3 Fatty Acids (FISH OIL) 1200 MG CAPS, Take 2 capsules by mouth daily., Disp: , Rfl:  .  triamterene-hydrochlorothiazide (MAXZIDE-25) 37.5-25 MG tablet, TAKE ONE TABLET BY MOUTH ONCE DAILY, Disp: 90 tablet, Rfl: 4 .  Vitamin E 400 UNITS TABS, Take 1 tablet by mouth daily., Disp: , Rfl:   Review of Systems  Constitutional: Negative for appetite change, chills, fatigue and fever.  Respiratory: Negative for chest tightness and shortness of breath.   Cardiovascular: Negative for chest pain and palpitations.  Gastrointestinal: Negative for abdominal pain, nausea and vomiting.  Endocrine: Negative for cold intolerance, heat intolerance, polydipsia, polyphagia and polyuria.  Genitourinary: Positive for dysuria (burning sensation ) and urgency.  Neurological: Negative for dizziness and weakness.  Hematological: Bruises/bleeds easily.    Social History  Substance Use Topics  . Smoking status: Never Smoker  . Smokeless tobacco: Never Used  . Alcohol use 0.0 oz/week     Comment: has 3 ounces of wine each week   Objective:   BP 124/70 (BP Location: Left Arm, Patient Position: Sitting, Cuff Size: Normal)   Pulse 83   Temp 98 F (36.7 C) (Oral)   Resp 16   Wt 159 lb (72.1 kg)   SpO2 96% Comment: room  air  BMI 29.08 kg/m     Physical Exam  Small bruise noted inferior and medial right periorbital area. No tenderness.   General Appearance:    Alert, cooperative, no distress  Eyes:    PERRL, conjunctiva/corneas clear, EOM's intact       Lungs:     Clear to auscultation bilaterally, respirations unlabored  Heart:    Regular rate and rhythm  Neurologic:   Awake, alert, oriented x 3. No apparent  focal neurological           defect.        Results for orders placed or performed in visit on 09/11/16  POCT Urinalysis Dipstick  Result Value Ref Range   Color, UA yellow    Clarity, UA clear    Glucose, UA negative    Bilirubin, UA negative    Ketones, UA negative    Spec Grav, UA <=1.005    Blood, UA Trace (non-hemolyzed)    pH, UA 8.0    Protein, UA negative    Urobilinogen, UA 0.2    Nitrite, UA negative    Leukocytes, UA Trace (A) Negative  POCT HgB A1C  Result Value Ref Range   Hemoglobin A1C 6.2    Est. average glucose Bld gHb Est-mCnc 131   POCT UA - Microalbumin  Result Value Ref Range   Microalbumin Ur, POC negative mg/L   Creatinine, POC n/a mg/dL  Albumin/Creatinine Ratio, Urine, POC n/a        Assessment & Plan:     1. Dysuria Normal u/a symptoms resolved today.  - POCT Urinalysis Dipstick  2. Type 2 diabetes mellitus without complication, without long-term current use of insulin (HCC) Well controlled.   - POCT HgB A1C - POCT UA - Microalbumin  3. Contusion of right eyelid, initial encounter No other abnormal bruising or bleeding.   4. Colon cancer screening  - Cologuard  5. Essential (primary) hypertension Well controlled.  Continue current medications.    Return in about 5 months (around 01/26/2017).        Lelon Huh, MD  [facility]

## 2016-09-18 ENCOUNTER — Telehealth: Payer: Self-pay | Admitting: Family Medicine

## 2016-09-18 NOTE — Telephone Encounter (Signed)
Order for cologuard faxed to [facility] °

## 2016-09-23 ENCOUNTER — Ambulatory Visit: Payer: PPO | Admitting: Family Medicine

## 2016-09-25 ENCOUNTER — Ambulatory Visit: Payer: PPO | Admitting: Family Medicine

## 2016-10-06 DIAGNOSIS — Z1211 Encounter for screening for malignant neoplasm of colon: Secondary | ICD-10-CM | POA: Diagnosis not present

## 2016-10-06 DIAGNOSIS — Z1212 Encounter for screening for malignant neoplasm of rectum: Secondary | ICD-10-CM | POA: Diagnosis not present

## 2016-10-07 ENCOUNTER — Ambulatory Visit: Payer: PPO

## 2016-10-10 LAB — COLOGUARD: COLOGUARD: POSITIVE

## 2016-10-14 ENCOUNTER — Other Ambulatory Visit: Payer: Self-pay | Admitting: Family Medicine

## 2016-10-14 ENCOUNTER — Encounter: Payer: Self-pay | Admitting: Family Medicine

## 2016-10-14 ENCOUNTER — Other Ambulatory Visit: Payer: Self-pay

## 2016-10-14 DIAGNOSIS — Z1211 Encounter for screening for malignant neoplasm of colon: Secondary | ICD-10-CM

## 2016-10-14 DIAGNOSIS — R195 Other fecal abnormalities: Secondary | ICD-10-CM | POA: Insufficient documentation

## 2016-10-14 NOTE — Progress Notes (Signed)
Advised  ED 

## 2016-11-03 DIAGNOSIS — H61032 Chondritis of left external ear: Secondary | ICD-10-CM | POA: Diagnosis not present

## 2016-11-03 DIAGNOSIS — L821 Other seborrheic keratosis: Secondary | ICD-10-CM | POA: Diagnosis not present

## 2016-11-03 DIAGNOSIS — L720 Epidermal cyst: Secondary | ICD-10-CM | POA: Diagnosis not present

## 2016-11-24 ENCOUNTER — Other Ambulatory Visit: Payer: Self-pay | Admitting: Gastroenterology

## 2016-11-24 DIAGNOSIS — R1310 Dysphagia, unspecified: Secondary | ICD-10-CM | POA: Diagnosis not present

## 2016-11-24 DIAGNOSIS — R195 Other fecal abnormalities: Secondary | ICD-10-CM | POA: Diagnosis not present

## 2016-12-03 ENCOUNTER — Ambulatory Visit
Admission: RE | Admit: 2016-12-03 | Discharge: 2016-12-03 | Disposition: A | Discharge status: Home / Self Care | Payer: PPO | Source: Ambulatory Visit | Attending: Gastroenterology | Admitting: Gastroenterology

## 2016-12-03 DIAGNOSIS — M2578 Osteophyte, vertebrae: Secondary | ICD-10-CM | POA: Diagnosis not present

## 2016-12-03 DIAGNOSIS — R1310 Dysphagia, unspecified: Secondary | ICD-10-CM | POA: Diagnosis not present

## 2016-12-03 DIAGNOSIS — K449 Diaphragmatic hernia without obstruction or gangrene: Secondary | ICD-10-CM | POA: Insufficient documentation

## 2016-12-19 ENCOUNTER — Encounter: Payer: Self-pay | Admitting: Family Medicine

## 2016-12-19 ENCOUNTER — Ambulatory Visit (INDEPENDENT_AMBULATORY_CARE_PROVIDER_SITE_OTHER): Payer: PPO | Admitting: Family Medicine

## 2016-12-19 VITALS — BP 122/70 | HR 72 | Temp 98.5°F | Resp 18 | Wt 160.0 lb

## 2016-12-19 DIAGNOSIS — J069 Acute upper respiratory infection, unspecified: Secondary | ICD-10-CM | POA: Diagnosis not present

## 2016-12-19 MED ORDER — DOXYCYCLINE HYCLATE 100 MG PO TABS: 100.0000 mg | ORAL_TABLET | Freq: Two times a day (BID) | ORAL | 0 refills | Status: DC

## 2016-12-19 MED ORDER — PREDNISONE 20 MG PO TABS: 20.0000 mg | ORAL_TABLET | Freq: Two times a day (BID) | ORAL | 0 refills | Status: DC

## 2016-12-19 NOTE — Progress Notes (Signed)
Patient: Destiny Gilbert Female    DOB: 1940/12/15   76 y.o.   MRN: [id number] Visit Date: 12/19/2016  Today's Provider: Lelon Huh, MD   Chief Complaint  Patient presents with  . Hoarse   Subjective:    HPI Hoarseness & Cough  Patient comes in today complaining of intermittent hoarseness for the past year. She states the hoarseness worsens when she talks for longer than a couple of minutes. She reports this seems to be worsening. She also reports that last weekend she has a productive cough that is now resoled. She read the package insert on her Advair and saw that it could cause side effects of cough and hoarseness. Patient stopped taking Advair 2 days ago and symptoms have improved.  States it will flare up every 3-4 weeks.  She was treated with doxycycline and prednisone in February when she had some chest pain attributed to bronchitis. Symptoms completely resolved for several weeks.   Allergies  Allergen Reactions  . Sulfa Antibiotics Nausea And Vomiting     Current Outpatient Prescriptions:  .  acetaminophen (TYLENOL) 500 MG tablet, Take 500 mg by mouth every 6 (six) hours as needed., Disp: , Rfl:  .  albuterol (PROAIR HFA) 108 (90 BASE) MCG/ACT inhaler, Inhale 2 puffs into the lungs every 6 (six) hours as needed., Disp: , Rfl:  .  aspirin EC 81 MG tablet, Take 81 mg by mouth daily., Disp: , Rfl:  .  glucose blood (ONE TOUCH ULTRA TEST) test strip, Use to check blood sugar daily, Disp: 100 each, Rfl: 12 .  ibuprofen (ADVIL,MOTRIN) 600 MG tablet, Take 600 mg by mouth every 6 (six) hours as needed., Disp: , Rfl:  .  lovastatin (MEVACOR) 20 MG tablet, TAKE ONE TABLET BY MOUTH IN THE EVENING, Disp: 90 tablet, Rfl: 4 .  metFORMIN (GLUCOPHAGE-XR) 500 MG 24 hr tablet, TAKE ONE TABLET BY MOUTH ONCE DAILY FOR  BLOOD  SUGAR, Disp: 30 tablet, Rfl: 12 .  montelukast (SINGULAIR) 10 MG tablet, TAKE ONE TABLET BY MOUTH AT BEDTIME, Disp: 90 tablet, Rfl: 4 .  MULTIPLE VITAMIN PO, Take  1 tablet by mouth daily., Disp: , Rfl:  .  Omega-3 Fatty Acids (FISH OIL) 1200 MG CAPS, Take 2 capsules by mouth daily., Disp: , Rfl:  .  triamterene-hydrochlorothiazide (MAXZIDE-25) 37.5-25 MG tablet, TAKE ONE TABLET BY MOUTH ONCE DAILY, Disp: 90 tablet, Rfl: 4 .  Vitamin E 400 UNITS TABS, Take 1 tablet by mouth daily., Disp: , Rfl:  .  ADVAIR DISKUS 250-50 MCG/DOSE AEPB, INHALE ONE DOSE BY MOUTH TWICE DAILY (Patient not taking: Reported on 12/19/2016), Disp: 60 each, Rfl: 12  Review of Systems  Constitutional: Negative for appetite change, chills, fatigue and fever.  HENT: Positive for voice change.   Respiratory: Positive for cough. Negative for chest tightness and shortness of breath.   Cardiovascular: Negative for chest pain and palpitations.  Gastrointestinal: Negative for abdominal pain, nausea and vomiting.  Neurological: Negative for dizziness and weakness.    Social History  Substance Use Topics  . Smoking status: Never Smoker  . Smokeless tobacco: Never Used  . Alcohol use 0.0 oz/week     Comment: has 3 ounces of wine each week   Objective:   BP 122/70 (BP Location: Left Arm, Patient Position: Sitting, Cuff Size: Large)   Pulse 72   Temp 98.5 F (36.9 C) (Oral)   Resp 18   Wt 160 lb (72.6 kg)   SpO2  96% Comment: room air  BMI 29.26 kg/m  Vitals:   12/19/16 1550  Resp: 18  Weight: 160 lb (72.6 kg)     Physical Exam  General Appearance:    Alert, cooperative, no distress  HENT:   bilateral TM normal without fluid or infection, neck without nodes, sinuses nontender, post nasal drip noted and nasal mucosa pale and congested  Eyes:    PERRL, conjunctiva/corneas clear, EOM's intact       Lungs:     Clear to auscultation bilaterally, respirations unlabored  Heart:    Regular rate and rhythm  Neurologic:   Awake, alert, oriented x 3. No apparent focal neurological           defect.           Assessment & Plan:     1. Upper respiratory tract infection,  unspecified type  - doxycycline (VIBRA-TABS) 100 MG tablet; Take 1 tablet (100 mg total) by mouth 2 (two) times daily.  Dispense: 20 tablet; Refill: 0 - predniSONE (DELTASONE) 20 MG tablet; Take 1 tablet (20 mg total) by mouth 2 (two) times daily with a meal.  Dispense: 20 tablet; Refill: 0  Call if symptoms change or if not rapidly improving.           Lelon Huh, MD  [facility]

## 2016-12-22 ENCOUNTER — Encounter: Payer: Self-pay | Admitting: *Deleted

## 2016-12-23 ENCOUNTER — Ambulatory Visit: Payer: PPO | Admitting: Certified Registered Nurse Anesthetist

## 2016-12-23 ENCOUNTER — Encounter: Payer: Self-pay | Admitting: *Deleted

## 2016-12-23 ENCOUNTER — Ambulatory Visit
Admission: RE | Admit: 2016-12-23 | Discharge: 2016-12-23 | Disposition: A | Discharge status: Home / Self Care | Payer: PPO | Source: Ambulatory Visit | Attending: Gastroenterology | Admitting: Gastroenterology

## 2016-12-23 ENCOUNTER — Encounter
Admission: RE | Disposition: A | Discharge status: Home / Self Care | Payer: Self-pay | Source: Ambulatory Visit | Attending: Gastroenterology

## 2016-12-23 DIAGNOSIS — K644 Residual hemorrhoidal skin tags: Secondary | ICD-10-CM | POA: Insufficient documentation

## 2016-12-23 DIAGNOSIS — K635 Polyp of colon: Secondary | ICD-10-CM | POA: Diagnosis not present

## 2016-12-23 DIAGNOSIS — K219 Gastro-esophageal reflux disease without esophagitis: Secondary | ICD-10-CM | POA: Diagnosis not present

## 2016-12-23 DIAGNOSIS — Z7984 Long term (current) use of oral hypoglycemic drugs: Secondary | ICD-10-CM | POA: Insufficient documentation

## 2016-12-23 DIAGNOSIS — K621 Rectal polyp: Secondary | ICD-10-CM | POA: Insufficient documentation

## 2016-12-23 DIAGNOSIS — D128 Benign neoplasm of rectum: Secondary | ICD-10-CM | POA: Diagnosis not present

## 2016-12-23 DIAGNOSIS — Z7982 Long term (current) use of aspirin: Secondary | ICD-10-CM | POA: Insufficient documentation

## 2016-12-23 DIAGNOSIS — Z1211 Encounter for screening for malignant neoplasm of colon: Secondary | ICD-10-CM | POA: Insufficient documentation

## 2016-12-23 DIAGNOSIS — K5790 Diverticulosis of intestine, part unspecified, without perforation or abscess without bleeding: Secondary | ICD-10-CM | POA: Diagnosis not present

## 2016-12-23 DIAGNOSIS — J45909 Unspecified asthma, uncomplicated: Secondary | ICD-10-CM | POA: Diagnosis not present

## 2016-12-23 DIAGNOSIS — K5730 Diverticulosis of large intestine without perforation or abscess without bleeding: Secondary | ICD-10-CM | POA: Insufficient documentation

## 2016-12-23 DIAGNOSIS — K649 Unspecified hemorrhoids: Secondary | ICD-10-CM | POA: Diagnosis not present

## 2016-12-23 DIAGNOSIS — Z79899 Other long term (current) drug therapy: Secondary | ICD-10-CM | POA: Insufficient documentation

## 2016-12-23 DIAGNOSIS — K642 Third degree hemorrhoids: Secondary | ICD-10-CM | POA: Diagnosis not present

## 2016-12-23 DIAGNOSIS — R195 Other fecal abnormalities: Secondary | ICD-10-CM | POA: Diagnosis not present

## 2016-12-23 DIAGNOSIS — K648 Other hemorrhoids: Secondary | ICD-10-CM | POA: Diagnosis not present

## 2016-12-23 DIAGNOSIS — D124 Benign neoplasm of descending colon: Secondary | ICD-10-CM | POA: Diagnosis not present

## 2016-12-23 DIAGNOSIS — E119 Type 2 diabetes mellitus without complications: Secondary | ICD-10-CM | POA: Diagnosis not present

## 2016-12-23 HISTORY — PX: COLONOSCOPY WITH PROPOFOL: SHX5780

## 2016-12-23 HISTORY — DX: Type 2 diabetes mellitus without complications: E11.9

## 2016-12-23 LAB — GLUCOSE, CAPILLARY: GLUCOSE-CAPILLARY: 103 mg/dL — AB (ref 65–99)

## 2016-12-23 SURGERY — COLONOSCOPY WITH PROPOFOL
Anesthesia: General

## 2016-12-23 MED ORDER — SODIUM CHLORIDE 0.9 % IV SOLN
INTRAVENOUS | Status: DC
  Administered 2016-12-23: 1000 mL via INTRAVENOUS

## 2016-12-23 MED ORDER — FENTANYL CITRATE (PF) 100 MCG/2ML IJ SOLN
INTRAMUSCULAR | Status: DC | PRN
  Administered 2016-12-23: 50 ug via INTRAVENOUS

## 2016-12-23 MED ORDER — PROPOFOL 500 MG/50ML IV EMUL
INTRAVENOUS | Status: AC
  Filled 2016-12-23: qty 50

## 2016-12-23 MED ORDER — LIDOCAINE HCL (PF) 1 % IJ SOLN
2.0000 mL | Freq: Once | INTRAMUSCULAR | Status: AC
  Administered 2016-12-23: 0.3 mL via INTRADERMAL
  Filled 2016-12-23: qty 2

## 2016-12-23 MED ORDER — PROPOFOL 10 MG/ML IV BOLUS
INTRAVENOUS | Status: DC | PRN
  Administered 2016-12-23: 40 mg via INTRAVENOUS

## 2016-12-23 MED ORDER — PROPOFOL 500 MG/50ML IV EMUL
INTRAVENOUS | Status: DC | PRN
  Administered 2016-12-23: 120 ug/kg/min via INTRAVENOUS

## 2016-12-23 MED ORDER — LIDOCAINE HCL (PF) 2 % IJ SOLN
INTRAMUSCULAR | Status: AC
  Filled 2016-12-23: qty 2

## 2016-12-23 MED ORDER — FENTANYL CITRATE (PF) 100 MCG/2ML IJ SOLN
INTRAMUSCULAR | Status: AC
  Filled 2016-12-23: qty 2

## 2016-12-23 MED ORDER — SODIUM CHLORIDE 0.9 % IV SOLN: INTRAVENOUS | Status: DC

## 2016-12-23 NOTE — Transfer of Care (Signed)
Immediate Anesthesia Transfer of Care Note  Patient: Destiny Gilbert  Procedure(s) Performed: Procedure(s): COLONOSCOPY WITH PROPOFOL (N/A)  Patient Location: PACU  Anesthesia Type:General  Level of Consciousness: sedated  Airway & Oxygen Therapy: Patient Spontanous Breathing and Patient connected to nasal cannula oxygen  Post-op Assessment: Report given to RN and Post -op Vital signs reviewed and stable  Post vital signs: Reviewed and stable  Last Vitals:  Vitals:   12/23/16 0826  BP: (!) 148/63  Pulse: 73  Resp: 17  Temp: 36.6 C    Last Pain:  Vitals:   12/23/16 0826  TempSrc: Tympanic         Complications: No apparent anesthesia complications

## 2016-12-23 NOTE — Anesthesia Preprocedure Evaluation (Signed)
Anesthesia Evaluation  Patient identified by MRN, date of birth, ID band Patient awake    Reviewed: Allergy & Precautions, H&P , NPO status , Patient's Chart, lab work & pertinent test results, reviewed documented beta blocker date and time   History of Anesthesia Complications Negative for: history of anesthetic complications  Airway Mallampati: II  TM Distance: >3 FB Neck ROM: full    Dental  (+) Caps, Dental Advidsory Given, Missing, Teeth Intact   Pulmonary neg shortness of breath, asthma , neg sleep apnea, pneumonia, resolved, neg COPD, neg recent URI,           Cardiovascular Exercise Tolerance: Good hypertension, (-) angina(-) CAD, (-) Past MI, (-) Cardiac Stents and (-) CABG (-) dysrhythmias (-) Valvular Problems/Murmurs     Neuro/Psych negative neurological ROS  negative psych ROS   GI/Hepatic Neg liver ROS, PUD, GERD  ,  Endo/Other  diabetes, Well Controlled, Type 2, Oral Hypoglycemic Agents  Renal/GU negative Renal ROS  negative genitourinary   Musculoskeletal   Abdominal   Peds  Hematology negative hematology ROS (+)   Anesthesia Other Findings Past Medical History: No date: Diabetes mellitus without complication (H[facility]) No date: History of peptic ulcer     Comment: perforated 01/23/2015: Pneumonia     Comment: [facility]    Reproductive/Obstetrics negative OB ROS                             Anesthesia Physical Anesthesia Plan  ASA: III  Anesthesia Plan: General   Post-op Pain Management:    Induction: Intravenous  PONV Risk Score and Plan: 3 and Propofol  Airway Management Planned: Natural Airway  Additional Equipment:   Intra-op Plan:   Post-operative Plan:   Informed Consent: I have reviewed the patients History and Physical, chart, labs and discussed the procedure including the risks, benefits and alternatives for the proposed anesthesia with the patient or  authorized representative who has indicated his/her understanding and acceptance.   Dental Advisory Given  Plan Discussed with: Anesthesiologist, CRNA and Surgeon  Anesthesia Plan Comments:         Anesthesia Quick Evaluation

## 2016-12-23 NOTE — Anesthesia Postprocedure Evaluation (Signed)
Anesthesia Post Note  Patient: Destiny Gilbert  Procedure(s) Performed: Procedure(s) (LRB): COLONOSCOPY WITH PROPOFOL (N/A)  Patient location during evaluation: Endoscopy Anesthesia Type: General Level of consciousness: awake and alert Pain management: pain level controlled Vital Signs Assessment: post-procedure vital signs reviewed and stable Respiratory status: spontaneous breathing, nonlabored ventilation, respiratory function stable and patient connected to nasal cannula oxygen Cardiovascular status: blood pressure returned to baseline and stable Postop Assessment: no signs of nausea or vomiting Anesthetic complications: no     Last Vitals:  Vitals:   12/23/16 1004 12/23/16 1014  BP: 132/70 121/83  Pulse: (!) 57 (!) 54  Resp: 11 16  Temp:      Last Pain:  Vitals:   12/23/16 0934  TempSrc: Tympanic                 Martha Clan

## 2016-12-23 NOTE — H&P (Signed)
Outpatient short stay form Pre-procedure 12/23/2016 8:39 AM Destiny Sails MD  Primary Physician: Dr. Lelon Huh  Reason for visit:  Colonoscopy  \ History of present illness:  Patient is a 76 year old female presenting today as above. She has a personal history of a positive: Guarded recently resulted. She is presenting today for colonoscopy. She does take 81 mg aspirin daily that has been held today. She takes no other aspirin products or blood thinning agents.    Current Facility-Administered Medications:  .  0.9 %  sodium chloride infusion, , Intravenous, Continuous, Destiny Sails, MD .  0.9 %  sodium chloride infusion, , Intravenous, Continuous, Destiny Sails, MD .  lidocaine (PF) (XYLOCAINE) 1 % injection 2 mL, 2 mL, Intradermal, Once, Manya Silvas, MD  Prescriptions Prior to Admission  Medication Sig Dispense Refill Last Dose  . ibuprofen (ADVIL,MOTRIN) 600 MG tablet Take 600 mg by mouth every 6 (six) hours as needed.   Past Month at Unknown time  . acetaminophen (TYLENOL) 500 MG tablet Take 500 mg by mouth every 6 (six) hours as needed.   Taking  . ADVAIR DISKUS 250-50 MCG/DOSE AEPB INHALE ONE DOSE BY MOUTH TWICE DAILY (Patient not taking: Reported on 12/19/2016) 60 each 12 Not Taking  . albuterol (PROAIR HFA) 108 (90 BASE) MCG/ACT inhaler Inhale 2 puffs into the lungs every 6 (six) hours as needed.   Taking  . aspirin EC 81 MG tablet Take 81 mg by mouth daily.   12/19/2016  . doxycycline (VIBRA-TABS) 100 MG tablet Take 1 tablet (100 mg total) by mouth 2 (two) times daily. 20 tablet 0   . glucose blood (ONE TOUCH ULTRA TEST) test strip Use to check blood sugar daily 100 each 12 Taking  . lovastatin (MEVACOR) 20 MG tablet TAKE ONE TABLET BY MOUTH IN THE EVENING 90 tablet 4 Taking  . metFORMIN (GLUCOPHAGE-XR) 500 MG 24 hr tablet TAKE ONE TABLET BY MOUTH ONCE DAILY FOR  BLOOD  SUGAR 30 tablet 12 Taking  . montelukast (SINGULAIR) 10 MG tablet TAKE ONE TABLET BY MOUTH  AT BEDTIME 90 tablet 4 Taking  . MULTIPLE VITAMIN PO Take 1 tablet by mouth daily.   Taking  . Omega-3 Fatty Acids (FISH OIL) 1200 MG CAPS Take 2 capsules by mouth daily.   Taking  . predniSONE (DELTASONE) 20 MG tablet Take 1 tablet (20 mg total) by mouth 2 (two) times daily with a meal. 20 tablet 0   . triamterene-hydrochlorothiazide (MAXZIDE-25) 37.5-25 MG tablet TAKE ONE TABLET BY MOUTH ONCE DAILY 90 tablet 4 Taking  . Vitamin E 400 UNITS TABS Take 1 tablet by mouth daily.   Taking     Allergies  Allergen Reactions  . Sulfa Antibiotics Nausea And Vomiting     Past Medical History:  Diagnosis Date  . Diabetes mellitus without complication ([place])   . History of peptic ulcer    perforated  . Pneumonia 01/23/2015   [facility]     Review of systems:      Physical Exam    Heart and lungs: Regular rate and rhythm without rub or gallop, lungs are bilaterally clear.    HEENT: Normocephalic atraumatic eyes are anicteric    Other:     Pertinant exam for procedure: Soft nontender nondistended bowel sounds positive normoactive.    Planned proceedures: Colonoscopy and indicated procedures. I have discussed the risks benefits and complications of procedures to include not limited to bleeding, infection, perforation and the risk of sedation and  the patient wishes to proceed.    Destiny Sails, MD Gastroenterology 12/23/2016  8:39 AM

## 2016-12-23 NOTE — Op Note (Signed)
[facility] Gastroenterology Patient Name: Destiny Gilbert Procedure Date: 12/23/2016 8:52 AM MRN: [id number] Account #: [id number] Date of Birth: 01-06-1941 Admit Type: Outpatient Age: 76 Room: [facility] ENDO ROOM 3 Gender: Female Note Status: Finalized Procedure:            Colonoscopy Indications:          Positive Cologuard test Providers:            Lollie Sails, MD Referring MD:         Kirstie Peri. Caryn Section, MD (Referring MD) Medicines:            Monitored Anesthesia Care Complications:        No immediate complications. Procedure:            Pre-Anesthesia Assessment:                       - ASA Grade Assessment: III - A patient with severe                        systemic disease.                       After obtaining informed consent, the colonoscope was                        passed under direct vision. Throughout the procedure,                        the patient's blood pressure, pulse, and oxygen                        saturations were monitored continuously. The                        Colonoscope was introduced through the anus and                        advanced to the the cecum, identified by appendiceal                        orifice and ileocecal valve. The colonoscopy was                        performed with moderate difficulty due to significant                        looping and a tortuous colon. Successful completion of                        the procedure was aided by changing the patient to a                        supine position, changing the patient to a prone                        position and using manual pressure. The quality of the                        bowel preparation was good. Findings:      Multiple small to medium diverticula were found in the  sigmoid colon and       distal descending colon.      A 4 mm polyp was found in the proximal descending colon. The polyp was       sessile. The polyp was removed with a cold biopsy forceps.  Resection and       retrieval were complete.      Two sessile polyps were found in the rectum. The polyps were 1 to 2 mm       in size. These polyps were removed with a cold biopsy forceps. Resection       and retrieval were complete.      Non-bleeding prolapsed external and internal hemorrhoids were found       during retroflexion, during perianal exam and during anoscopy. The       hemorrhoids were medium-sized and Grade III (internal hemorrhoids that       prolapse but require manual reduction).      No additional abnormalities were found on retroflexion.      The digital rectal exam was normal otherwise. Impression:           - Diverticulosis in the sigmoid colon and in the distal                        descending colon.                       - One 4 mm polyp in the proximal descending colon,                        removed with a cold biopsy forceps. Resected and                        retrieved.                       - Two 1 to 2 mm polyps in the rectum, removed with a                        cold biopsy forceps. Resected and retrieved.                       - Non-bleeding prolapsed external and internal                        hemorrhoids. Recommendation:       - Discharge patient to home.                       - Use Analpram HC Cream 2.5%: Apply externally TID for                        10 days.                       - Return to GI clinic in 3 weeks. Consider referral to                        surgery for hemorrhoids if not resolved. Procedure Code(s):    --- Professional ---                       [id number], Colonoscopy, flexible; with biopsy,  single or                        multiple Diagnosis Code(s):    --- Professional ---                       K64.2, Third degree hemorrhoids                       D12.4, Benign neoplasm of descending colon                       K62.1, Rectal polyp                       R19.5, Other fecal abnormalities                       K57.30, Diverticulosis  of large intestine without                        perforation or abscess without bleeding CPT copyright 2016 [employer]. All rights reserved. The codes documented in this report are preliminary and upon coder review may  be revised to meet current compliance requirements. Lollie Sails, MD 12/23/2016 9:45:40 AM This report has been signed electronically. Number of Addenda: 0 Note Initiated On: 12/23/2016 8:52 AM Scope Withdrawal Time: 0 hours 16 minutes 20 seconds  Total Procedure Duration: 0 hours 28 minutes 44 seconds       [facility]

## 2016-12-23 NOTE — Anesthesia Post-op Follow-up Note (Cosign Needed)
Anesthesia QCDR form completed.        

## 2016-12-24 ENCOUNTER — Encounter: Payer: Self-pay | Admitting: Gastroenterology

## 2016-12-25 ENCOUNTER — Encounter: Payer: Self-pay | Admitting: Family Medicine

## 2016-12-25 DIAGNOSIS — K635 Polyp of colon: Secondary | ICD-10-CM | POA: Insufficient documentation

## 2016-12-25 LAB — SURGICAL PATHOLOGY

## 2017-01-02 ENCOUNTER — Ambulatory Visit (INDEPENDENT_AMBULATORY_CARE_PROVIDER_SITE_OTHER): Payer: PPO

## 2017-01-02 VITALS — BP 136/74 | HR 60 | Temp 99.3°F | Ht 62.0 in | Wt 162.2 lb

## 2017-01-02 DIAGNOSIS — Z0000 Encounter for general adult medical examination without abnormal findings: Secondary | ICD-10-CM

## 2017-01-02 NOTE — Patient Instructions (Signed)
Destiny Gilbert , Thank you for taking time to come for your Medicare Wellness Visit. I appreciate your ongoing commitment to your health goals. Please review the following plan we discussed and let me know if I can assist you in the future.   Screening recommendations/referrals: Colonoscopy: completed 12/25/16 Mammogram: completed 08/12/16, due 08/2017 Bone Density: completed 07/04/14 Recommended yearly ophthalmology/optometry visit for glaucoma screening and checkup Recommended yearly dental visit for hygiene and checkup  Vaccinations: Influenza vaccine: due 03/2017 Pneumococcal vaccine: completed series Tdap vaccine: completed 05/03/07 Shingles vaccine: completed 06/12/09  Advanced directives: Advance directive discussed with you today. Even though you declined this today please call our office should you change your mind and we can give you the proper paperwork for you to fill out.  Conditions/risks identified:Recommend decreasing amount of sugar and carbohydrates in daily diet.    Next appointment: 02/12/17 @ 8:15 AM   Preventive Care 74 Years and Older, Female Preventive care refers to lifestyle choices and visits with your health care provider that can promote health and wellness. What does preventive care include?  A yearly physical exam. This is also called an annual well check.  Dental exams once or twice a year.  Routine eye exams. Ask your health care provider how often you should have your eyes checked.  Personal lifestyle choices, including:  Daily care of your teeth and gums.  Regular physical activity.  Eating a healthy diet.  Avoiding tobacco and drug use.  Limiting alcohol use.  Practicing safe sex.  Taking low-dose aspirin every day.  Taking vitamin and mineral supplements as recommended by your health care provider. What happens during an annual well check? The services and screenings done by your health care provider during your annual well check  will depend on your age, overall health, lifestyle risk factors, and family history of disease. Counseling  Your health care provider may ask you questions about your:  Alcohol use.  Tobacco use.  Drug use.  Emotional well-being.  Home and relationship well-being.  Sexual activity.  Eating habits.  History of falls.  Memory and ability to understand (cognition).  Work and work Statistician.  Reproductive health. Screening  You may have the following tests or measurements:  Height, weight, and BMI.  Blood pressure.  Lipid and cholesterol levels. These may be checked every 5 years, or more frequently if you are over 69 years old.  Skin check.  Lung cancer screening. You may have this screening every year starting at age 60 if you have a 30-pack-year history of smoking and currently smoke or have quit within the past 15 years.  Fecal occult blood test (FOBT) of the stool. You may have this test every year starting at age 6.  Flexible sigmoidoscopy or colonoscopy. You may have a sigmoidoscopy every 5 years or a colonoscopy every 10 years starting at age 20.  Hepatitis C blood test.  Hepatitis B blood test.  Sexually transmitted disease (STD) testing.  Diabetes screening. This is done by checking your blood sugar (glucose) after you have not eaten for a while (fasting). You may have this done every 1-3 years.  Bone density scan. This is done to screen for osteoporosis. You may have this done starting at age 77.  Mammogram. This may be done every 1-2 years. Talk to your health care provider about how often you should have regular mammograms. Talk with your health care provider about your test results, treatment options, and if necessary, the need for more tests. Vaccines  Your health care provider may recommend certain vaccines, such as:  Influenza vaccine. This is recommended every year.  Tetanus, diphtheria, and acellular pertussis (Tdap, Td) vaccine. You may  need a Td booster every 10 years.  Zoster vaccine. You may need this after age 58.  Pneumococcal 13-valent conjugate (PCV13) vaccine. One dose is recommended after age 26.  Pneumococcal polysaccharide (PPSV23) vaccine. One dose is recommended after age 40. Talk to your health care provider about which screenings and vaccines you need and how often you need them. This information is not intended to replace advice given to you by your health care provider. Make sure you discuss any questions you have with your health care provider. Document Released: 07/20/2015 Document Revised: 03/12/2016 Document Reviewed: 04/24/2015 Elsevier Interactive Patient Education  2017 [place] Prevention in the Home Falls can cause injuries. They can happen to people of all ages. There are many things you can do to make your home safe and to help prevent falls. What can I do on the outside of my home?  Regularly fix the edges of walkways and driveways and fix any cracks.  Remove anything that might make you trip as you walk through a door, such as a raised step or threshold.  Trim any bushes or trees on the path to your home.  Use bright outdoor lighting.  Clear any walking paths of anything that might make someone trip, such as rocks or tools.  Regularly check to see if handrails are loose or broken. Make sure that both sides of any steps have handrails.  Any raised decks and porches should have guardrails on the edges.  Have any leaves, snow, or ice cleared regularly.  Use sand or salt on walking paths during winter.  Clean up any spills in your garage right away. This includes oil or grease spills. What can I do in the bathroom?  Use night lights.  Install grab bars by the toilet and in the tub and shower. Do not use towel bars as grab bars.  Use non-skid mats or decals in the tub or shower.  If you need to sit down in the shower, use a plastic, non-slip stool.  Keep the floor  dry. Clean up any water that spills on the floor as soon as it happens.  Remove soap buildup in the tub or shower regularly.  Attach bath mats securely with double-sided non-slip rug tape.  Do not have throw rugs and other things on the floor that can make you trip. What can I do in the bedroom?  Use night lights.  Make sure that you have a light by your bed that is easy to reach.  Do not use any sheets or blankets that are too big for your bed. They should not hang down onto the floor.  Have a firm chair that has side arms. You can use this for support while you get dressed.  Do not have throw rugs and other things on the floor that can make you trip. What can I do in the kitchen?  Clean up any spills right away.  Avoid walking on wet floors.  Keep items that you use a lot in easy-to-reach places.  If you need to reach something above you, use a strong step stool that has a grab bar.  Keep electrical cords out of the way.  Do not use floor polish or wax that makes floors slippery. If you must use wax, use non-skid floor wax.  Do  not have throw rugs and other things on the floor that can make you trip. What can I do with my stairs?  Do not leave any items on the stairs.  Make sure that there are handrails on both sides of the stairs and use them. Fix handrails that are broken or loose. Make sure that handrails are as long as the stairways.  Check any carpeting to make sure that it is firmly attached to the stairs. Fix any carpet that is loose or worn.  Avoid having throw rugs at the top or bottom of the stairs. If you do have throw rugs, attach them to the floor with carpet tape.  Make sure that you have a light switch at the top of the stairs and the bottom of the stairs. If you do not have them, ask someone to add them for you. What else can I do to help prevent falls?  Wear shoes that:  Do not have high heels.  Have rubber bottoms.  Are comfortable and fit you  well.  Are closed at the toe. Do not wear sandals.  If you use a stepladder:  Make sure that it is fully opened. Do not climb a closed stepladder.  Make sure that both sides of the stepladder are locked into place.  Ask someone to hold it for you, if possible.  Clearly mark and make sure that you can see:  Any grab bars or handrails.  First and last steps.  Where the edge of each step is.  Use tools that help you move around (mobility aids) if they are needed. These include:  Canes.  Walkers.  Scooters.  Crutches.  Turn on the lights when you go into a dark area. Replace any light bulbs as soon as they burn out.  Set up your furniture so you have a clear path. Avoid moving your furniture around.  If any of your floors are uneven, fix them.  If there are any pets around you, be aware of where they are.  Review your medicines with your doctor. Some medicines can make you feel dizzy. This can increase your chance of falling. Ask your doctor what other things that you can do to help prevent falls. This information is not intended to replace advice given to you by your health care provider. Make sure you discuss any questions you have with your health care provider. Document Released: 04/19/2009 Document Revised: 11/29/2015 Document Reviewed: 07/28/2014 Elsevier Interactive Patient Education  2017 [employer].

## 2017-01-02 NOTE — Progress Notes (Signed)
Subjective:   Destiny Gilbert is a 76 y.o. female who presents for Medicare Annual (Subsequent) preventive examination.  Review of Systems:  N/A  Cardiac Risk Factors include: advanced age (>63men, >73 women);diabetes mellitus;dyslipidemia;hypertension     Objective:     Vitals: BP 136/74 (BP Location: Right Arm)   Pulse 60   Temp 99.3 F (37.4 C) (Oral)   Ht 5\' 2"  (1.575 m)   Wt 162 lb 3.2 oz (73.6 kg)   BMI 29.67 kg/m   Body mass index is 29.67 kg/m.   Tobacco History  Smoking Status  . Never Smoker  Smokeless Tobacco  . Never Used     Counseling given: Not Answered   Past Medical History:  Diagnosis Date  . Diabetes mellitus without complication ([place])   . History of peptic ulcer    perforated  . Pneumonia 01/23/2015   [facility]    Past Surgical History:  Procedure Laterality Date  . ABDOMINAL HYSTERECTOMY  1994   BSO  . CATARACT EXTRACTION     left eye- 09/2014; righr eye- 08/13/2014  . COLONOSCOPY WITH PROPOFOL N/A 12/23/2016   Procedure: COLONOSCOPY WITH PROPOFOL;  Surgeon: Lollie Sails, MD;  Location: [facility] ENDOSCOPY;  Service: Endoscopy;  Laterality: N/A;   Family History  Problem Relation Age of Onset  . Lung cancer Mother   . Pancreatic cancer Father   . Hypertension Sister   . Hyperlipidemia Sister   . Breast cancer Neg Hx    History  Sexual Activity  . Sexual activity: Not on file    Outpatient Encounter Prescriptions as of 01/02/2017  Medication Sig  . acetaminophen (TYLENOL) 500 MG tablet Take 500 mg by mouth every 6 (six) hours as needed.  Marland Kitchen aspirin EC 81 MG tablet Take 81 mg by mouth daily.  Marland Kitchen doxycycline (VIBRA-TABS) 100 MG tablet Take 1 tablet (100 mg total) by mouth 2 (two) times daily.  Marland Kitchen glucose blood (ONE TOUCH ULTRA TEST) test strip Use to check blood sugar daily  . hydrocortisone (ANUSOL-HC) 2.5 % rectal cream Apply 1 application topically 3 (three) times daily.   Marland Kitchen ibuprofen (ADVIL,MOTRIN) 600 MG tablet Take 600 mg by mouth  every 6 (six) hours as needed.  . lovastatin (MEVACOR) 20 MG tablet TAKE ONE TABLET BY MOUTH IN THE EVENING  . MAGNESIUM PO Take 250 mg by mouth at bedtime.  . metFORMIN (GLUCOPHAGE-XR) 500 MG 24 hr tablet TAKE ONE TABLET BY MOUTH ONCE DAILY FOR  BLOOD  SUGAR  . MULTIPLE VITAMIN PO Take 1 tablet by mouth daily.  . Omega-3 Fatty Acids (FISH OIL) 1200 MG CAPS Take 2 capsules by mouth daily.  . predniSONE (DELTASONE) 20 MG tablet Take 1 tablet (20 mg total) by mouth 2 (two) times daily with a meal.  . triamterene-hydrochlorothiazide (MAXZIDE-25) 37.5-25 MG tablet TAKE ONE TABLET BY MOUTH ONCE DAILY  . Vitamin E 400 UNITS TABS Take 1 tablet by mouth daily.  Marland Kitchen ADVAIR DISKUS 250-50 MCG/DOSE AEPB INHALE ONE DOSE BY MOUTH TWICE DAILY (Patient not taking: Reported on 12/19/2016)  . albuterol (PROAIR HFA) 108 (90 BASE) MCG/ACT inhaler Inhale 2 puffs into the lungs every 6 (six) hours as needed.  . montelukast (SINGULAIR) 10 MG tablet TAKE ONE TABLET BY MOUTH AT BEDTIME (Patient not taking: Reported on 01/02/2017)   No facility-administered encounter medications on file as of 01/02/2017.     Activities of Daily Living In your present state of health, do you have any difficulty performing the following activities:  01/02/2017 05/20/2016  Hearing? N N  Vision? N N  Difficulty concentrating or making decisions? N N  Walking or climbing stairs? N N  Dressing or bathing? N N  Doing errands, shopping? N N  Preparing Food and eating ? N -  Using the Toilet? N -  In the past six months, have you accidently leaked urine? N -  Do you have problems with loss of bowel control? N -  Managing your Medications? N -  Managing your Finances? N -  Housekeeping or managing your Housekeeping? N -  Some recent data might be hidden    Patient Care Team: Birdie Sons, MD as PCP - General (Family Medicine) [place], [place] (Optometry) Lollie Sails, MD as Consulting Physician (Gastroenterology)      Assessment:     Exercise Activities and Dietary recommendations Current Exercise Habits: Home exercise routine, Type of exercise: walking, Time (Minutes): 15 (to 20 minutes), Frequency (Times/Week): 2 (to 3 times ), Weekly Exercise (Minutes/Week): 30, Intensity: Mild, Exercise limited by: None identified  Goals    . Reduce sugar intake          Recommend decreasing amount of sugar and carbohydrates in daily diet.       Fall Risk Fall Risk  01/02/2017 05/20/2016 05/17/2015  Falls in the past year? No No No   Depression Screen PHQ 2/9 Scores 01/02/2017 01/02/2017 05/20/2016 05/17/2015  PHQ - 2 Score 0 0 0 0  PHQ- 9 Score 0 - - -     Cognitive Function     6CIT Screen 01/02/2017  What Year? 0 points  What month? 0 points  What time? 0 points  Count back from 20 0 points  Months in reverse 0 points  Repeat phrase 0 points  Total Score 0    Immunization History  Administered Date(s) Administered  . Influenza, High Dose Seasonal PF 03/18/2016  . Influenza,inj,Quad PF,36+ Mos 05/17/2015  . Pneumococcal Conjugate-13 05/20/2016  . Pneumococcal Polysaccharide-23 04/12/2008, 09/12/2013  . Tdap 05/03/2007  . Zoster 06/12/2009   Screening Tests Health Maintenance  Topic Date Due  . FOOT EXAM  02/11/2017 (Originally 09/23/1950)  . OPHTHALMOLOGY EXAM  12/05/2017 (Originally 09/23/1950)  . INFLUENZA VACCINE  02/04/2017  . HEMOGLOBIN A1C  03/14/2017  . TETANUS/TDAP  05/02/2017  . URINE MICROALBUMIN  09/11/2017  . MAMMOGRAM  08/12/2018  . DEXA SCAN  07/05/2019  . COLONOSCOPY  12/24/2026  . PNA vac Low Risk Adult  Completed      Plan:  I have personally reviewed and addressed the Medicare Annual Wellness questionnaire and have noted the following in the patient's chart:  A. Medical and social history B. Use of alcohol, tobacco or illicit drugs  C. Current medications and supplements D. Functional ability and status E.  Nutritional status F.  Physical activity G. Advance  directives H. List of other physicians I.  Hospitalizations, surgeries, and ER visits in previous 12 months J.  [place] such as hearing and vision if needed, cognitive and depression L. Referrals and appointments - none  In addition, I have reviewed and discussed with patient certain preventive protocols, quality metrics, and best practice recommendations. A written personalized care plan for preventive services as well as general preventive health recommendations were provided to patient.  See attached scanned questionnaire for additional information.   Signed,  Fabio Neighbors, LPN Nurse Health Advisor   MD Recommendations: Pt needs a diabetic foot exam at next OV on 02/12/17. Pt declined  eye exam unless needed.

## 2017-01-05 ENCOUNTER — Inpatient Hospital Stay: Payer: PPO

## 2017-01-05 ENCOUNTER — Inpatient Hospital Stay
Admission: EM | Admit: 2017-01-05 | Discharge: 2017-01-06 | DRG: 065 | Disposition: A | Discharge status: Home / Self Care | Payer: PPO | Attending: Specialist | Admitting: Specialist

## 2017-01-05 ENCOUNTER — Encounter: Payer: Self-pay | Admitting: Emergency Medicine

## 2017-01-05 ENCOUNTER — Emergency Department: Payer: PPO

## 2017-01-05 DIAGNOSIS — E876 Hypokalemia: Secondary | ICD-10-CM | POA: Diagnosis not present

## 2017-01-05 DIAGNOSIS — R079 Chest pain, unspecified: Secondary | ICD-10-CM | POA: Diagnosis present

## 2017-01-05 DIAGNOSIS — Z7984 Long term (current) use of oral hypoglycemic drugs: Secondary | ICD-10-CM

## 2017-01-05 DIAGNOSIS — Z79899 Other long term (current) drug therapy: Secondary | ICD-10-CM

## 2017-01-05 DIAGNOSIS — E860 Dehydration: Secondary | ICD-10-CM | POA: Diagnosis present

## 2017-01-05 DIAGNOSIS — Z882 Allergy status to sulfonamides status: Secondary | ICD-10-CM

## 2017-01-05 DIAGNOSIS — E1151 Type 2 diabetes mellitus with diabetic peripheral angiopathy without gangrene: Secondary | ICD-10-CM | POA: Diagnosis present

## 2017-01-05 DIAGNOSIS — R110 Nausea: Secondary | ICD-10-CM | POA: Diagnosis not present

## 2017-01-05 DIAGNOSIS — I10 Essential (primary) hypertension: Secondary | ICD-10-CM | POA: Diagnosis present

## 2017-01-05 DIAGNOSIS — Z7952 Long term (current) use of systemic steroids: Secondary | ICD-10-CM

## 2017-01-05 DIAGNOSIS — R29700 NIHSS score 0: Secondary | ICD-10-CM | POA: Diagnosis not present

## 2017-01-05 DIAGNOSIS — Z8673 Personal history of transient ischemic attack (TIA), and cerebral infarction without residual deficits: Secondary | ICD-10-CM | POA: Diagnosis present

## 2017-01-05 DIAGNOSIS — Z86010 Personal history of colonic polyps: Secondary | ICD-10-CM | POA: Diagnosis not present

## 2017-01-05 DIAGNOSIS — Z7982 Long term (current) use of aspirin: Secondary | ICD-10-CM | POA: Diagnosis not present

## 2017-01-05 DIAGNOSIS — I6522 Occlusion and stenosis of left carotid artery: Secondary | ICD-10-CM | POA: Diagnosis not present

## 2017-01-05 DIAGNOSIS — E871 Hypo-osmolality and hyponatremia: Secondary | ICD-10-CM | POA: Diagnosis not present

## 2017-01-05 DIAGNOSIS — I639 Cerebral infarction, unspecified: Secondary | ICD-10-CM | POA: Diagnosis not present

## 2017-01-05 DIAGNOSIS — K219 Gastro-esophageal reflux disease without esophagitis: Secondary | ICD-10-CM | POA: Diagnosis not present

## 2017-01-05 DIAGNOSIS — Z8249 Family history of ischemic heart disease and other diseases of the circulatory system: Secondary | ICD-10-CM | POA: Diagnosis not present

## 2017-01-05 DIAGNOSIS — E119 Type 2 diabetes mellitus without complications: Secondary | ICD-10-CM

## 2017-01-05 DIAGNOSIS — R51 Headache: Secondary | ICD-10-CM | POA: Diagnosis not present

## 2017-01-05 LAB — GLUCOSE, CAPILLARY
GLUCOSE-CAPILLARY: 150 mg/dL — AB (ref 65–99)
Glucose-Capillary: 89 mg/dL (ref 65–99)

## 2017-01-05 LAB — CBC WITH DIFFERENTIAL/PLATELET
Basophils Absolute: 0 10*3/uL (ref 0–0.1)
Basophils Relative: 0 %
EOS PCT: 3 %
Eosinophils Absolute: 0.3 10*3/uL (ref 0–0.7)
HCT: 38 % (ref 35.0–47.0)
Hemoglobin: 13.3 g/dL (ref 12.0–16.0)
LYMPHS ABS: 2.5 10*3/uL (ref 1.0–3.6)
LYMPHS PCT: 31 %
MCH: 33.3 pg (ref 26.0–34.0)
MCHC: 35 g/dL (ref 32.0–36.0)
MCV: 95.1 fL (ref 80.0–100.0)
Monocytes Absolute: 0.9 10*3/uL (ref 0.2–0.9)
Monocytes Relative: 11 %
Neutro Abs: 4.6 10*3/uL (ref 1.4–6.5)
Neutrophils Relative %: 55 %
PLATELETS: 238 10*3/uL (ref 150–440)
RBC: 3.99 MIL/uL (ref 3.80–5.20)
RDW: 13.1 % (ref 11.5–14.5)
WBC: 8.3 10*3/uL (ref 3.6–11.0)

## 2017-01-05 LAB — BASIC METABOLIC PANEL
ANION GAP: 6 (ref 5–15)
BUN: 27 mg/dL — ABNORMAL HIGH (ref 6–20)
CALCIUM: 8.2 mg/dL — AB (ref 8.9–10.3)
CO2: 30 mmol/L (ref 22–32)
CREATININE: 1 mg/dL (ref 0.44–1.00)
Chloride: 96 mmol/L — ABNORMAL LOW (ref 101–111)
GFR, EST NON AFRICAN AMERICAN: 54 mL/min — AB (ref >60)
Glucose, Bld: 104 mg/dL — ABNORMAL HIGH (ref 65–99)
Potassium: 2.8 mmol/L — ABNORMAL LOW (ref 3.5–5.1)
Sodium: 132 mmol/L — ABNORMAL LOW (ref 135–145)

## 2017-01-05 LAB — SEDIMENTATION RATE: Sed Rate: 5 mm/hr (ref 0–30)

## 2017-01-05 LAB — PROTIME-INR
INR: 1.02
PROTHROMBIN TIME: 13.4 s (ref 11.4–15.2)

## 2017-01-05 LAB — APTT: APTT: 24 s (ref 24–36)

## 2017-01-05 LAB — MAGNESIUM: Magnesium: 1.6 mg/dL — ABNORMAL LOW (ref 1.7–2.4)

## 2017-01-05 MED ORDER — METOCLOPRAMIDE HCL 5 MG/ML IJ SOLN
20.0000 mg | Freq: Once | INTRAVENOUS | Status: AC
  Administered 2017-01-05: 20 mg via INTRAVENOUS
  Filled 2017-01-05: qty 4

## 2017-01-05 MED ORDER — INSULIN ASPART 100 UNIT/ML ~~LOC~~ SOLN: 0.0000 [IU] | Freq: Three times a day (TID) | SUBCUTANEOUS | Status: DC

## 2017-01-05 MED ORDER — POTASSIUM CHLORIDE IN NACL 40-0.9 MEQ/L-% IV SOLN
INTRAVENOUS | Status: DC
  Administered 2017-01-05 – 2017-01-06 (×2): 100 mL/h via INTRAVENOUS
  Filled 2017-01-05 (×6): qty 1000

## 2017-01-05 MED ORDER — ACETAMINOPHEN 160 MG/5ML PO SOLN
650.0000 mg | ORAL | Status: DC | PRN
  Filled 2017-01-05: qty 20.3

## 2017-01-05 MED ORDER — MONTELUKAST SODIUM 10 MG PO TABS
10.0000 mg | ORAL_TABLET | Freq: Every day | ORAL | Status: DC
  Administered 2017-01-05: 21:00:00 10 mg via ORAL
  Filled 2017-01-05: qty 1

## 2017-01-05 MED ORDER — SODIUM CHLORIDE 0.9 % IV SOLN
INTRAVENOUS | Status: DC
  Administered 2017-01-05: 07:00:00 via INTRAVENOUS

## 2017-01-05 MED ORDER — KETOROLAC TROMETHAMINE 30 MG/ML IJ SOLN
INTRAMUSCULAR | Status: AC
  Filled 2017-01-05: qty 1

## 2017-01-05 MED ORDER — ENOXAPARIN SODIUM 40 MG/0.4ML ~~LOC~~ SOLN
40.0000 mg | SUBCUTANEOUS | Status: DC
  Administered 2017-01-05: 21:00:00 40 mg via SUBCUTANEOUS
  Filled 2017-01-05: qty 0.4

## 2017-01-05 MED ORDER — ACETAMINOPHEN 650 MG RE SUPP: 650.0000 mg | RECTAL | Status: DC | PRN

## 2017-01-05 MED ORDER — INSULIN ASPART 100 UNIT/ML ~~LOC~~ SOLN: 0.0000 [IU] | Freq: Every day | SUBCUTANEOUS | Status: DC

## 2017-01-05 MED ORDER — KETOROLAC TROMETHAMINE 30 MG/ML IJ SOLN
10.0000 mg | Freq: Once | INTRAMUSCULAR | Status: AC
  Administered 2017-01-05: 9.9 mg via INTRAVENOUS

## 2017-01-05 MED ORDER — STROKE: EARLY STAGES OF RECOVERY BOOK
Freq: Once | Status: AC
  Administered 2017-01-05: 17:00:00

## 2017-01-05 MED ORDER — ACETAMINOPHEN 325 MG PO TABS
650.0000 mg | ORAL_TABLET | ORAL | Status: DC | PRN
  Administered 2017-01-06: 650 mg via ORAL
  Filled 2017-01-05: qty 2

## 2017-01-05 MED ORDER — MORPHINE SULFATE (PF) 4 MG/ML IV SOLN
4.0000 mg | Freq: Once | INTRAVENOUS | Status: AC
  Administered 2017-01-05: 4 mg via INTRAVENOUS
  Filled 2017-01-05: qty 1

## 2017-01-05 MED ORDER — ASPIRIN 325 MG PO TABS
325.0000 mg | ORAL_TABLET | Freq: Every day | ORAL | Status: DC
  Administered 2017-01-06: 10:00:00 325 mg via ORAL
  Filled 2017-01-05: qty 1

## 2017-01-05 MED ORDER — ASPIRIN 81 MG PO CHEW
324.0000 mg | CHEWABLE_TABLET | Freq: Once | ORAL | Status: AC
  Administered 2017-01-05: 324 mg via ORAL
  Filled 2017-01-05: qty 4

## 2017-01-05 MED ORDER — ONDANSETRON HCL 4 MG/2ML IJ SOLN
4.0000 mg | Freq: Once | INTRAMUSCULAR | Status: AC
  Administered 2017-01-05: 4 mg via INTRAVENOUS
  Filled 2017-01-05: qty 2

## 2017-01-05 MED ORDER — MAGNESIUM OXIDE 400 (241.3 MG) MG PO TABS
200.0000 mg | ORAL_TABLET | Freq: Every day | ORAL | Status: DC
  Administered 2017-01-05: 21:00:00 200 mg via ORAL
  Filled 2017-01-05: qty 1

## 2017-01-05 MED ORDER — DIPHENHYDRAMINE HCL 50 MG/ML IJ SOLN
25.0000 mg | Freq: Once | INTRAMUSCULAR | Status: AC
Start: 2017-01-05 — End: 2017-01-05
  Administered 2017-01-05: 25 mg via INTRAVENOUS
  Filled 2017-01-05: qty 1

## 2017-01-05 MED ORDER — POTASSIUM CHLORIDE CRYS ER 20 MEQ PO TBCR
40.0000 meq | EXTENDED_RELEASE_TABLET | Freq: Once | ORAL | Status: AC
  Administered 2017-01-05: 40 meq via ORAL
  Filled 2017-01-05: qty 2

## 2017-01-05 MED ORDER — SENNOSIDES-DOCUSATE SODIUM 8.6-50 MG PO TABS: 1.0000 | ORAL_TABLET | Freq: Every evening | ORAL | Status: DC | PRN

## 2017-01-05 MED ORDER — ATORVASTATIN CALCIUM 20 MG PO TABS
40.0000 mg | ORAL_TABLET | Freq: Every day | ORAL | Status: DC
  Administered 2017-01-05: 17:00:00 40 mg via ORAL
  Filled 2017-01-05: qty 2

## 2017-01-05 NOTE — H&P (Addendum)
[facility] [place] NAME: Destiny Gilbert    MR#:  [id number]  DATE OF BIRTH:  07-16-1940  DATE OF ADMISSION:  01/05/2017  PRIMARY CARE PHYSICIAN: Birdie Sons, MD   REQUESTING/REFERRING PHYSICIAN: Lavonia Drafts, MD  CHIEF COMPLAINT:   Chief Complaint  Patient presents with  . Headache   Headache yesterday morning and this morning. HISTORY OF PRESENT ILLNESS:  Destiny Gilbert  is a 76 y.o. female with a known history of Hypertension, diabetes, pneumonia and PUD. The patient to present to the ED with the above chief complaints. She complains of right-sided headache yesterday morning, which resolved but come back this morning. She denies any dizziness, double vision, blurry vision, slurred speech, dysphagia or incontinence. She denies any weakness or numbness. 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 PAST MEDICAL HISTORY:   Past Medical History:  Diagnosis Date  . Diabetes mellitus without complication ([place])   . History of peptic ulcer    perforated  . Pneumonia 01/23/2015   [facility]     PAST SURGICAL HISTORY:   Past Surgical History:  Procedure Laterality Date  . ABDOMINAL HYSTERECTOMY  1994   BSO  . CATARACT EXTRACTION     left eye- 09/2014; righr eye- 08/13/2014  . COLONOSCOPY WITH PROPOFOL N/A 12/23/2016   Procedure: COLONOSCOPY WITH PROPOFOL;  Surgeon: Lollie Sails, MD;  Location: [facility] ENDOSCOPY;  Service: Endoscopy;  Laterality: N/A;    SOCIAL HISTORY:   Social History  Substance Use Topics  . Smoking status: Never Smoker  . Smokeless tobacco: Never Used  . Alcohol use 0.0 oz/week     Comment: has 3 ounces of wine each week    FAMILY HISTORY:   Family History  Problem Relation Age of Onset  . Lung cancer Mother   . Pancreatic cancer Father   . Hypertension Sister   . Hyperlipidemia Sister   . Breast cancer Neg Hx     DRUG ALLERGIES:   Allergies  Allergen Reactions  . Sulfa Antibiotics  Nausea And Vomiting    REVIEW OF SYSTEMS:   Review of Systems  Constitutional: Negative for chills, fever and malaise/fatigue.  HENT: Negative for hearing loss and sore throat.   Eyes: Negative for blurred vision and double vision.  Respiratory: Negative for cough, shortness of breath, wheezing and stridor.   Cardiovascular: Negative for chest pain, palpitations and leg swelling.  Gastrointestinal: Negative for abdominal pain, blood in stool, constipation, diarrhea, melena, nausea and vomiting.  Genitourinary: Negative for dysuria and hematuria.  Musculoskeletal: Negative for back pain.  Skin: Negative for itching and rash.  Neurological: Positive for headaches. Negative for dizziness, tingling, tremors, sensory change, speech change, focal weakness, seizures, loss of consciousness and weakness.  Endo/Heme/Allergies: Negative for polydipsia.  Psychiatric/Behavioral: Negative for depression. The patient is not nervous/anxious.     MEDICATIONS AT HOME:   Prior to Admission medications   Medication Sig Start Date End Date Taking? Authorizing Provider  acetaminophen (TYLENOL) 500 MG tablet Take 500 mg by mouth every 6 (six) hours as needed.   Yes [provider]  aspirin EC 81 MG tablet Take 81 mg by mouth daily.   Yes [provider]  lovastatin (MEVACOR) 20 MG tablet TAKE ONE TABLET BY MOUTH IN THE EVENING 02/25/16  Yes Birdie Sons, MD  MAGNESIUM PO Take 250 mg by mouth at bedtime.   Yes [provider]  metFORMIN (GLUCOPHAGE-XR) 500 MG 24 hr tablet  TAKE ONE TABLET BY MOUTH ONCE DAILY FOR  BLOOD  SUGAR 03/13/16  Yes Birdie Sons, MD  montelukast (SINGULAIR) 10 MG tablet TAKE ONE TABLET BY MOUTH AT BEDTIME 01/18/16  Yes Birdie Sons, MD  MULTIPLE VITAMIN PO Take 1 tablet by mouth daily. 04/12/08  Yes [provider]  Omega-3 Fatty Acids (FISH OIL) 1200 MG CAPS Take 1 capsule by mouth daily.    Yes [provider]    triamterene-hydrochlorothiazide (MAXZIDE-25) 37.5-25 MG tablet TAKE ONE TABLET BY MOUTH ONCE DAILY 02/18/16  Yes Birdie Sons, MD  Vitamin E 400 UNITS TABS Take 1 tablet by mouth daily. 04/12/08  Yes [provider]  ADVAIR DISKUS 250-50 MCG/DOSE AEPB INHALE ONE DOSE BY MOUTH TWICE DAILY Patient not taking: Reported on 12/19/2016 08/23/16   Birdie Sons, MD  albuterol (PROAIR HFA) 108 (90 BASE) MCG/ACT inhaler Inhale 2 puffs into the lungs every 6 (six) hours as needed. 10/01/12   [provider]  doxycycline (VIBRA-TABS) 100 MG tablet Take 1 tablet (100 mg total) by mouth 2 (two) times daily. Patient not taking: Reported on 01/05/2017 12/19/16   Birdie Sons, MD  glucose blood (ONE TOUCH ULTRA TEST) test strip Use to check blood sugar daily 12/28/15   Birdie Sons, MD  ibuprofen (ADVIL,MOTRIN) 600 MG tablet Take 600 mg by mouth every 6 (six) hours as needed.    [provider]  predniSONE (DELTASONE) 20 MG tablet Take 1 tablet (20 mg total) by mouth 2 (two) times daily with a meal. Patient not taking: Reported on 01/05/2017 12/19/16   Birdie Sons, MD      VITAL SIGNS:  Blood pressure (!) 104/49, pulse (!) 57, temperature 98.4 F (36.9 C), temperature source Oral, resp. rate 20, height 5\' 2"  (1.575 m), weight 162 lb (73.5 kg), SpO2 94 %.  PHYSICAL EXAMINATION:  Physical Exam  GENERAL:  76 y.o.-year-old patient lying in the bed with no acute distress.  EYES: Pupils equal, round, reactive to light and accommodation. No scleral icterus. Extraocular muscles intact.  HEENT: Head atraumatic, normocephalic. Oropharynx and nasopharynx clear.  NECK:  Supple, no jugular venous distention. No thyroid enlargement, no tenderness.  LUNGS: Normal breath sounds bilaterally, no wheezing, rales,rhonchi or crepitation. No use of accessory muscles of respiration.  CARDIOVASCULAR: S1, S2 normal. No murmurs, rubs, or gallops.  ABDOMEN: Soft, nontender, nondistended. Bowel  sounds present. No organomegaly or mass.  EXTREMITIES: No pedal edema, cyanosis, or clubbing.  NEUROLOGIC: Facial droop on the right side. Muscle strength 5/5 in all extremities. Sensation intact. Gait not checked.  PSYCHIATRIC: The patient is alert and oriented x 3.  SKIN: No obvious rash, lesion, or ulcer.   LABORATORY PANEL:   CBC  Recent Labs Lab 01/05/17 0706  WBC 8.3  HGB 13.3  HCT 38.0  PLT 238   ------------------------------------------------------------------------------------------------------------------  Chemistries   Recent Labs Lab 01/05/17 0706  NA 132*  K 2.8*  CL 96*  CO2 30  GLUCOSE 104*  BUN 27*  CREATININE 1.00  CALCIUM 8.2*   ------------------------------------------------------------------------------------------------------------------  Cardiac Enzymes No results for input(s): TROPONINI in the last 168 hours. ------------------------------------------------------------------------------------------------------------------  RADIOLOGY:  Ct Head Wo Contrast  Result Date: 01/05/2017 CLINICAL DATA:  76 year old diabetic female with headache behind right eye starting yesterday. Nausea. Initial encounter. EXAM: CT HEAD WITHOUT CONTRAST TECHNIQUE: Contiguous axial images were obtained from the base of the skull through the vertex without intravenous contrast. COMPARISON:  11/11/2006 brain MR. No comparison head CT.  FINDINGS: Brain: No intracranial hemorrhage or CT evidence of large acute infarct. Mild chronic microvascular changes. No hydrocephalus. No intracranial mass lesion noted on this unenhanced exam. 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 No secondary findings of pseudotumor cerebri. Incidentally noted are falx calcifications. Vascular: Very mild vascular calcifications. Skull: Negative. Sinuses/Orbits: Post lens replacement. No acute orbital abnormality noted. Polypoid opacification inferior aspect right maxillary sinus. Mild mucosal thickening left  maxillary sinus. Mild polypoid opacification/ mucosal thickening mid to posterior ethmoid sinus air cells bilaterally. Mild mucosal thickening sphenoid sinus air cells greater on the left. Frontal sinuses are clear. Other: Mastoid air cells and middle ear cavities are clear. IMPRESSION: No intracranial hemorrhage or CT evidence of large acute infarct. 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 No secondary findings of pseudotumor cerebri. Polypoid opacification inferior aspect right maxillary sinus. Mild mucosal thickening left maxillary sinus. Mild polypoid opacification/ mucosal thickening mid to posterior ethmoid sinus air cells bilaterally. Mild mucosal thickening sphenoid sinus air cells greater on the left. Electronically Signed   By: Genia Del [name]   On: 01/05/2017 07:25   Mr Jodene Nam Head Wo Contrast  Result Date: 01/05/2017 CLINICAL DATA:  Abrupt onset of severe frontal headache. EXAM: MRI HEAD WITHOUT CONTRAST MRA HEAD WITHOUT CONTRAST TECHNIQUE: Multiplanar, multiecho pulse sequences of the brain and surrounding structures were obtained without intravenous contrast. Angiographic images of the head were obtained using MRA technique without contrast. COMPARISON:  11/11/2006 brain MRI FINDINGS: MRI HEAD FINDINGS Brain: Subcentimeter acute infarct in the right occipital white matter. Small cortically based infarct in the anterior right frontal lobe measuring up to 17 mm in length. Minimal for age white matter gliosis, mainly periventricular and likely microvascular. Normal brain volume. No chronic blood products. No hemorrhage, hydrocephalus, or masslike findings. Vascular: Normal dural venous sinus flow voids. Arterial findings below. Skull and upper cervical spine: Generalized hypointense appearance of marrow, stable from prior. No focal lesions seen. Sinuses/Orbits: Mild mucosal thickening in the ethmoids. Mucous retention cysts in the right maxillary sinus. MRA HEAD FINDINGS Symmetric carotid arteries,  tortuous before the skullbase. There is poor signal about the bilateral MCA bifurcations that is attributed to artifact from end of slab imaging and vessel direction. No suspected superimposed flow limiting stenosis or occlusion on source images. Negative for aneurysm. Symmetric poor signal in the distal vertebral arteries is attributed to artifact. There is a convincing moderate left P2 segment stenosis. No branch occlusion suspected. Negative for aneurysm. A small right posterior communicating artery is present. IMPRESSION: 1. Small acute infarcts in the right frontal cortex and right occipital white matter. 2. Allowing for artifact, no acute or reversal finding on intracranial MRA. 3. Moderate left P2 segment stenosis. Electronically Signed   By: Monte Fantasia [name]   On: 01/05/2017 10:42   Mr Brain Wo Contrast  Result Date: 01/05/2017 CLINICAL DATA:  Abrupt onset of severe frontal headache. EXAM: MRI HEAD WITHOUT CONTRAST MRA HEAD WITHOUT CONTRAST TECHNIQUE: Multiplanar, multiecho pulse sequences of the brain and surrounding structures were obtained without intravenous contrast. Angiographic images of the head were obtained using MRA technique without contrast. COMPARISON:  11/11/2006 brain MRI FINDINGS: MRI HEAD FINDINGS Brain: Subcentimeter acute infarct in the right occipital white matter. Small cortically based infarct in the anterior right frontal lobe measuring up to 17 mm in length. Minimal for age white matter gliosis, mainly periventricular and likely microvascular. Normal brain volume. No chronic blood products. No hemorrhage, hydrocephalus, or masslike findings. Vascular: Normal dural venous sinus flow voids. Arterial findings below. Skull  and upper cervical spine: Generalized hypointense appearance of marrow, stable from prior. No focal lesions seen. Sinuses/Orbits: Mild mucosal thickening in the ethmoids. Mucous retention cysts in the right maxillary sinus. MRA HEAD FINDINGS Symmetric carotid  arteries, tortuous before the skullbase. There is poor signal about the bilateral MCA bifurcations that is attributed to artifact from end of slab imaging and vessel direction. No suspected superimposed flow limiting stenosis or occlusion on source images. Negative for aneurysm. Symmetric poor signal in the distal vertebral arteries is attributed to artifact. There is a convincing moderate left P2 segment stenosis. No branch occlusion suspected. Negative for aneurysm. A small right posterior communicating artery is present. IMPRESSION: 1. Small acute infarcts in the right frontal cortex and right occipital white matter. 2. Allowing for artifact, no acute or reversal finding on intracranial MRA. 3. Moderate left P2 segment stenosis. Electronically Signed   By: Monte Fantasia [name]   On: 01/05/2017 10:42      IMPRESSION AND PLAN:   Acute CVA. The patient will be admitted to medical floor. She was given aspirin 325 mg just now. Continue aspirin, start Lipitor 40 mg by mouth at bedtime. Follow up MRI/MRA of the brain, carotid duplex and echocardiogram. Check lipid panel, neurology consult.Swallowing study, PTOT evaluation.  Hypertension. Hold hypertension medication due to low blood pressure to get permissive blood pressure due to acute CVA. Diabetes. I'll hold metformin, start sliding scale.  Hypokalemia. Give potassium supplement, follow-up potassium and magnesium level.  Dehydration. Hold maxzide, give IV fluid support. Hyponatremia. normal saline IV. Follow-up BMP.  All the records are reviewed and case discussed with ED provider. Management plans discussed with the patient, her husband and they are in agreement.  CODE STATUS: Full code  TOTAL TIME TAKING CARE OF THIS PATIENT: 53 minutes.    Demetrios Loll M.D on 01/05/2017 at 11:48 AM  Between 7am to 6pm - Pager - [phone]  After 6pm go to [email or web address] - Proofreader  [facility]  Office   [phone]  CC: Primary care physician; Birdie Sons, MD   Note: This dictation was prepared with Dragon dictation along with smaller phrase technology. Any transcriptional errors that result from this process are unintentional.

## 2017-01-05 NOTE — Evaluation (Signed)
Clinical/Bedside Swallow Evaluation Patient Details  Name: Destiny Gilbert MRN: [id number] Date of Birth: 07-20-1940  Today's Date: 01/05/2017 Time: SLP Start Time (ACUTE ONLY): 1530 SLP Stop Time (ACUTE ONLY): 1630 SLP Time Calculation (min) (ACUTE ONLY): 60 min  Past Medical History:  Past Medical History:  Diagnosis Date  . Diabetes mellitus without complication ([facility])   . History of peptic ulcer    perforated  . Pneumonia 01/23/2015   [facility]    Past Surgical History:  Past Surgical History:  Procedure Laterality Date  . ABDOMINAL HYSTERECTOMY  1994   BSO  . CATARACT EXTRACTION     left eye- 09/2014; righr eye- 08/13/2014  . COLONOSCOPY WITH PROPOFOL N/A 12/23/2016   Procedure: COLONOSCOPY WITH PROPOFOL;  Surgeon: Christena Deem, MD;  Location: [facility] ENDOSCOPY;  Service: Endoscopy;  Laterality: N/A;   HPI:   Pt is a 76 y.o. female with a known history of Hypertension, diabetes, pneumonia and PUD. The patient to present to the ED with the above chief complaints of severe Headache and nausea. She complains of right-sided headache yesterday morning, which resolved but come back this morning. She denies any dizziness, double vision, blurry vision, slurred speech, dysphagia or incontinence. She denies any weakness or numbness. Per MRI at admission, pt has Small acute infarcts in the right frontal cortex and right occipital white matter. 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   Assessment / Plan / Recommendation Clinical Impression  Pt appears at reduced risk for aspiration from an oropharyngeal phase standpoint of swallowing when following general aspiration precautions. 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 Pt does have report of dysmotility when eating out at restaurants and "being distracted". Pt and husband denied this occurred w/ consistency but felt it happened more frequently when eating out at restaurants. Pt consumed trials of thin liquids via cup/straw, and trials of purees/soft solid foods w/ no immediate, overt s/s of aspiration noted; no decline in respiratory status or change in vocal quality post trials; no regurgitation occurred or report of discomfort. Pt fed self; attempted to reduce distractions going on in room. Educated pt and husband on general aspiration precautions including use of applesauce when swallowing pills if needed for easier swallowing/clearing (Esophageally); recommend strict Reflux precautions and education given, handouts. Encouraged f/u w/ GI for further education and management of pt's Esophageal dysmotility; Reflux. ST services will f/u for any further education while admitted if needed. NSG updated. In addition, recommend reducing distractions while eating meals. SLP Visit Diagnosis: Dysphagia, pharyngoesophageal phase (R13.14)    Aspiration Risk   (reduced from an oropharyngeal phase standpoint)    Diet Recommendation  Dysphagia level 3 (mech soft) w/ Thin liquids; strict REFLUX precautions; aspiration precautions - including reducing distractions when eating meals(talking, environmental noise/people)  Medication Administration: Whole meds with puree (for easier clearing of Esophagus, if needed)    Other  Recommendations Recommended Consults: Consider GI evaluation (ongoing management and tx; education) Oral Care Recommendations: Oral care BID;Patient independent with oral care   Follow up Recommendations None      Frequency and Duration  n/a         Prognosis Prognosis for Safe Diet Advancement: Good Barriers to Reach Goals: Severity of deficits (of Esophageal dysmotility/baseline) Barriers/Prognosis  Comment: prominence of the cricopharyngeus fold; 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; presbyesophagus; hiatal hernia      Swallow Study   General Date of Onset: 01/05/17 Type of Study: Bedside Swallow Evaluation Previous Swallow Assessment: GI Barium study 11/2016 Diet Prior to this Study: Regular;Thin liquids (per report but has difficulty w/ some foods(meats)) Temperature Spikes Noted: No (wbc 8.3) Respiratory Status: Room air History of Recent Intubation: No Behavior/Cognition: Alert;Cooperative;Pleasant mood Oral Cavity Assessment: Within Functional Limits Oral Care Completed by SLP: Recent completion by staff Oral Cavity - Dentition: Adequate natural dentition Vision: Functional for self-feeding Self-Feeding Abilities: Able to feed self Patient Positioning: Upright in bed Baseline Vocal Quality: Normal Volitional Cough: Strong Volitional Swallow: Able to elicit    Oral/Motor/Sensory Function Overall Oral Motor/Sensory Function: Within functional limits   Ice Chips Ice chips: Within functional limits Presentation: Spoon (fed; x1 trial)   Thin Liquid Thin Liquid: Within functional limits Presentation: Cup;Self Fed;Straw (several ounces taking her time)    Nectar Thick Nectar Thick Liquid: Not tested   Honey Thick Honey Thick Liquid: Not tested   Puree Puree: Within functional limits Presentation: Self Fed;Spoon (3 trials)   Solid   GO   Solid: Within functional limits (more mech soft/moistened consistency) Presentation: Self Fed;Spoon (3 trials)         Destiny Som, MS, CCC-SLP Eleen Litz 01/05/2017,6:32 PM

## 2017-01-05 NOTE — ED Notes (Signed)
Patient transported to MRI 

## 2017-01-05 NOTE — ED Triage Notes (Signed)
Patient ambulatory to triage with complaints of HA behind right eye that started yesterday at 0500.  Intermittent pounding pain.  Pt reports Nausea deniesV/D/HA/chills/sweating/fever. Pt reports taking 650 tylenol this am before coming to ED.  Speaking in complete coherent sentences. Moaning in pain and holding head

## 2017-01-05 NOTE — ED Provider Notes (Signed)
[facility] Emergency Department Provider Note  ____________________________________________   First MD Initiated Contact with Patient 01/05/17 [phone]     (approximate)  I have reviewed the triage vital signs and the nursing notes.   HISTORY  Chief Complaint Headache   HPI Destiny Gilbert is a 76 y.o. female with a history of diabetes and "vascular headaches" who is presenting to the emergency departmentwith sudden onset right-sided headache that started at 5 AM yesterday. She says the headache was sudden onset 10 out of 10 headache yesterday morning. She says that she did not come in at that time because the headache abated after several hours. However, the headache has returned to a 10 out of 10 pain. She says it is mostly over her right temple. She denies any vomiting photophobia. Does not report vision change. Reports mild nausea. Has not had a similar headache in the past. Takes a daily aspirin but no other prior for anticoagulant.   Past Medical History:  Diagnosis Date  . Diabetes mellitus without complication ([place])   . History of peptic ulcer    perforated  . Pneumonia 01/23/2015   [facility]     Patient Active Problem List   Diagnosis Date Noted  . Hyperplastic colon polyp 12/25/2016  . Occult blood in stools 10/14/2016  . Diabetes ([place]) 12/16/2009  . Asthma 12/21/2008  . Allergic rhinitis 04/16/2008  . Essential (primary) hypertension 05/03/2007  . Mixed hyperlipidemia 07/07/2006  . Peptic ulcer 07/08/2003  . Colon, diverticulosis 07/07/1998    Past Surgical History:  Procedure Laterality Date  . ABDOMINAL HYSTERECTOMY  1994   BSO  . CATARACT EXTRACTION     left eye- 09/2014; righr eye- 08/13/2014  . COLONOSCOPY WITH PROPOFOL N/A 12/23/2016   Procedure: COLONOSCOPY WITH PROPOFOL;  Surgeon: Lollie Sails, MD;  Location: [facility] ENDOSCOPY;  Service: Endoscopy;  Laterality: N/A;    Prior to Admission medications   Medication Sig Start Date  End Date Taking? Authorizing Provider  acetaminophen (TYLENOL) 500 MG tablet Take 500 mg by mouth every 6 (six) hours as needed.    [provider]  ADVAIR DISKUS 250-50 MCG/DOSE AEPB INHALE ONE DOSE BY MOUTH TWICE DAILY Patient not taking: Reported on 12/19/2016 08/23/16   Birdie Sons, MD  albuterol (PROAIR HFA) 108 (90 BASE) MCG/ACT inhaler Inhale 2 puffs into the lungs every 6 (six) hours as needed. 10/01/12   [provider]  aspirin EC 81 MG tablet Take 81 mg by mouth daily.    [provider]  doxycycline (VIBRA-TABS) 100 MG tablet Take 1 tablet (100 mg total) by mouth 2 (two) times daily. 12/19/16   Birdie Sons, MD  glucose blood (ONE TOUCH ULTRA TEST) test strip Use to check blood sugar daily 12/28/15   Birdie Sons, MD  ibuprofen (ADVIL,MOTRIN) 600 MG tablet Take 600 mg by mouth every 6 (six) hours as needed.    [provider]  lovastatin (MEVACOR) 20 MG tablet TAKE ONE TABLET BY MOUTH IN THE EVENING 02/25/16   Birdie Sons, MD  MAGNESIUM PO Take 250 mg by mouth at bedtime.    [provider]  metFORMIN (GLUCOPHAGE-XR) 500 MG 24 hr tablet TAKE ONE TABLET BY MOUTH ONCE DAILY FOR  BLOOD  SUGAR 03/13/16   Birdie Sons, MD  montelukast (SINGULAIR) 10 MG tablet TAKE ONE TABLET BY MOUTH AT BEDTIME Patient not taking: Reported on 01/02/2017 01/18/16   Birdie Sons, MD  MULTIPLE VITAMIN PO Take  1 tablet by mouth daily. 04/12/08   [provider]  Omega-3 Fatty Acids (FISH OIL) 1200 MG CAPS Take 2 capsules by mouth daily.    [provider]  predniSONE (DELTASONE) 20 MG tablet Take 1 tablet (20 mg total) by mouth 2 (two) times daily with a meal. 12/19/16   Birdie Sons, MD  triamterene-hydrochlorothiazide (MAXZIDE-25) 37.5-25 MG tablet TAKE ONE TABLET BY MOUTH ONCE DAILY 02/18/16   Birdie Sons, MD  Vitamin E 400 UNITS TABS Take 1 tablet by mouth daily. 04/12/08   [provider]    Allergies Sulfa  antibiotics  Family History  Problem Relation Age of Onset  . Lung cancer Mother   . Pancreatic cancer Father   . Hypertension Sister   . Hyperlipidemia Sister   . Breast cancer Neg Hx     Social History Social History  Substance Use Topics  . Smoking status: Never Smoker  . Smokeless tobacco: Never Used  . Alcohol use 0.0 oz/week     Comment: has 3 ounces of wine each week    Review of Systems  Constitutional: No fever/chills Eyes: No visual changes. ENT: No sore throat. Cardiovascular: Denies chest pain. Respiratory: Denies shortness of breath. Gastrointestinal: No abdominal pain.  No nausea, no vomiting.  No diarrhea.  No constipation. Genitourinary: Negative for dysuria. Musculoskeletal: Negative for back pain. Skin: Negative for rash. Neurological: Negative for focal weakness or numbness.   ____________________________________________   PHYSICAL EXAM:  VITAL SIGNS: ED Triage Vitals [01/05/17 0609]  Enc Vitals Group     BP (!) 151/64     Pulse Rate (!) 58     Resp 20     Temp 98.4 F (36.9 C)     Temp Source Oral     SpO2 98 %     Weight 162 lb (73.5 kg)     Height 5\' 2"  (1.575 m)     Head Circumference      Peak Flow      Pain Score 10     Pain Loc      Pain Edu?      Excl. in [place]?     Constitutional: Alert and oriented. Appears uncomfortable.  Holding blanket over her head/eyes.  Eyes: Conjunctivae are normal.  Head: Atraumatic.No tenderness or nodularity along the bilateral distribution of the temporal arteries. Nose: No congestion/rhinnorhea. Mouth/Throat: Mucous membranes are moist.  Neck: No stridor.   Cardiovascular: Normal rate, regular rhythm. Grossly normal heart sounds.   Respiratory: Normal respiratory effort.  No retractions. Lungs CTAB. Gastrointestinal: Soft and nontender. No distention.  Musculoskeletal: No lower extremity tenderness nor edema.  No joint effusions. Neurologic:  Normal speech and language. No gross focal  neurologic deficits are appreciated. Skin:  Skin is warm, dry and intact. No rash noted. Psychiatric: Mood and affect are normal. Speech and behavior are normal.  ____________________________________________   LABS (all labs ordered are listed, but only abnormal results are displayed)  Labs Reviewed  SEDIMENTATION RATE  CBC WITH DIFFERENTIAL/PLATELET  BASIC METABOLIC PANEL  PROTIME-INR  APTT   ____________________________________________  EKG   ____________________________________________  RADIOLOGY   ____________________________________________   PROCEDURES  Procedure(s) performed:   Procedures  Critical Care performed:   ____________________________________________   INITIAL IMPRESSION / ASSESSMENT AND PLAN / ED COURSE  Pertinent labs & imaging results that were available during my care of the patient were reviewed by me and considered in my medical decision making (see chart for details).  ----------------------------------------- 7:00 AM  on 01/05/2017 -----------------------------------------  Patient at this time pending lab results as well as CT of the brain. Differential at this time includes temporal arteritis versus subarachnoid hemorrhage versus migraine headache versus sinus thrombosis. Signed out to Dr. Corky Downs.      ____________________________________________   FINAL CLINICAL IMPRESSION(S) / ED DIAGNOSES  Headache.    NEW MEDICATIONS STARTED DURING THIS VISIT:  New Prescriptions   No medications on file     Note:  This document was prepared using Dragon voice recognition software and may include unintentional dictation errors.     Orbie Pyo, MD 01/05/17 0700

## 2017-01-05 NOTE — ED Provider Notes (Signed)
I was asked to follow up on labs and CT result by Dr. Dineen Kid. CT head demonstrates some sinus disease which could be related to her headache however the patient still feels significant frontal pain even after morphine. We will provide additional analgesia and obtain MRI/MRA given unusual presentation   ----------------------------------------- 11:06 AM on 01/05/2017 -----------------------------------------  MRI demonstrates to acute infarcts in the frontal cortex, aspirin given will admit to the hospitalist service   Lavonia Drafts, MD 01/05/17 1106

## 2017-01-06 ENCOUNTER — Telehealth: Payer: Self-pay

## 2017-01-06 ENCOUNTER — Inpatient Hospital Stay
Admit: 2017-01-06 | Discharge: 2017-01-06 | Disposition: A | Discharge status: Home / Self Care | Payer: PPO | Attending: Internal Medicine | Admitting: Internal Medicine

## 2017-01-06 DIAGNOSIS — I10 Essential (primary) hypertension: Secondary | ICD-10-CM | POA: Diagnosis not present

## 2017-01-06 DIAGNOSIS — I639 Cerebral infarction, unspecified: Principal | ICD-10-CM

## 2017-01-06 DIAGNOSIS — E860 Dehydration: Secondary | ICD-10-CM | POA: Diagnosis not present

## 2017-01-06 DIAGNOSIS — E876 Hypokalemia: Secondary | ICD-10-CM | POA: Diagnosis not present

## 2017-01-06 LAB — LIPID PANEL
CHOL/HDL RATIO: 3.3 ratio
CHOLESTEROL: 176 mg/dL (ref 0–200)
HDL: 53 mg/dL (ref >40)
LDL CALC: 79 mg/dL (ref 0–99)
TRIGLYCERIDES: 219 mg/dL — AB (ref <150)
VLDL: 44 mg/dL — AB (ref 0–40)

## 2017-01-06 LAB — GLUCOSE, CAPILLARY
GLUCOSE-CAPILLARY: 102 mg/dL — AB (ref 65–99)
GLUCOSE-CAPILLARY: 96 mg/dL (ref 65–99)

## 2017-01-06 LAB — ECHOCARDIOGRAM COMPLETE
Height: 62 in
WEIGHTICAEL: 2592 [oz_av]

## 2017-01-06 LAB — TROPONIN I
Troponin I: <0.03 ng/mL (ref <0.03)
Troponin I: <0.03 ng/mL (ref <0.03)
Troponin I: <0.03 ng/mL (ref <0.03)

## 2017-01-06 MED ORDER — ASPIRIN EC 325 MG PO TBEC: 325.0000 mg | DELAYED_RELEASE_TABLET | Freq: Every day | ORAL | 1 refills | Status: DC

## 2017-01-06 MED ORDER — NITROGLYCERIN 0.3 MG SL SUBL: 0.3000 mg | SUBLINGUAL_TABLET | SUBLINGUAL | 0 refills | Status: DC | PRN

## 2017-01-06 MED ORDER — ATORVASTATIN CALCIUM 40 MG PO TABS: 40.0000 mg | ORAL_TABLET | Freq: Every day | ORAL | 1 refills | Status: DC

## 2017-01-06 MED ORDER — NITROGLYCERIN 0.4 MG SL SUBL
0.4000 mg | SUBLINGUAL_TABLET | SUBLINGUAL | Status: AC | PRN
  Administered 2017-01-06 (×3): 0.4 mg via SUBLINGUAL
  Filled 2017-01-06 (×3): qty 1

## 2017-01-06 NOTE — Discharge Summary (Signed)
[facility] [place] NAME: Destiny Gilbert    MR#:  [id number]  DATE OF BIRTH:  24-Feb-1941  DATE OF ADMISSION:  01/05/2017 ADMITTING PHYSICIAN: Demetrios Loll, MD  DATE OF DISCHARGE: 01/06/2017  PRIMARY CARE PHYSICIAN: Birdie Sons, MD    ADMISSION DIAGNOSIS:  CVA (cerebral vascular accident) [facility]) [I63.9] Cerebrovascular accident (CVA), unspecified mechanism ([place]) [I63.9]  DISCHARGE DIAGNOSIS:  Active Problems:   Acute CVA (cerebrovascular accident) ([place])   SECONDARY DIAGNOSIS:   Past Medical History:  Diagnosis Date  . Diabetes mellitus without complication ([place])   . History of peptic ulcer    perforated  . Pneumonia 01/23/2015   [facility]     HOSPITAL COURSE:   76 year old female with past medical history of diabetes, hypertension, history of peptic ulcer disease, osteoarthritis, asthma who presented to the hospital due to a headache and CT scan findings suggestive of an acute stroke.  1. Acute CVA-this was the cause of patient's right-sided frontal headache. Patient had MRI of the brain which showed a right frontal and occipital CVA. -Patient's MRA of the brain showed a posterior P2 segment stenosis. Patient was seen by neurology who recommended increasing the patient's aspirin from 81 mg at 325 mg. Patient's statin dose was also increased. She had no focal weakness, numbness or any other neurologic symptoms. Her headache has not resolved. -She was seen by physical therapy and occupational therapy and also speech and did not require any services. She is being discharged home on aspirin, statin.  2. Chest pain/pressure-patient developed some chest pain and pressure 1 the hospital which was relieved with nitroglycerin. Patient's cardiac markers 2 were negative. Currently patient is chest pain-free and hemodynamically stable. -She would benefit from getting outpatient workup including a stress test. This was discussed with the patient and the  patient's husband who will get an appointment with a cardiologist as an outpatient. Patient was advised that if her chest pain recurs or comes back she should come back to the hospital. She was given some nitroglycerin as needed for her chest pain. -We will continue aspirin, statin  3. 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  4. GERD-patient will continue omeprazole.  5. Essential HTN - pt. Will cont. His Triamterene/HCTZ.   DISCHARGE CONDITIONS:   Stable.   CONSULTS OBTAINED:  Treatment Team:  Leotis Pain, MD  DRUG ALLERGIES:   Allergies  Allergen Reactions  . Sulfa Antibiotics Nausea And Vomiting    DISCHARGE MEDICATIONS:   Allergies as of 01/06/2017      Reactions   Sulfa Antibiotics Nausea And Vomiting      Medication List    STOP taking these medications   doxycycline 100 MG tablet Commonly known as:  VIBRA-TABS   lovastatin 20 MG tablet Commonly known as:  MEVACOR   predniSONE 20 MG tablet Commonly known as:  DELTASONE     TAKE these medications   acetaminophen 500 MG tablet Commonly known as:  TYLENOL Take 500 mg by mouth every 6 (six) hours as needed.   ADVAIR DISKUS 250-50 MCG/DOSE Aepb Generic drug:  Fluticasone-Salmeterol INHALE ONE DOSE BY MOUTH TWICE DAILY   aspirin EC 325 MG tablet Take 1 tablet (325 mg total) by mouth daily. What changed:  medication strength  how much to take   atorvastatin 40 MG tablet Commonly known as:  LIPITOR Take 1 tablet (40 mg total) by mouth daily at 6 PM.   Fish Oil 1200 MG Caps Take 1  capsule by mouth daily.   glucose blood test strip Commonly known as:  ONE TOUCH ULTRA TEST Use to check blood sugar daily   ibuprofen 600 MG tablet Commonly known as:  ADVIL,MOTRIN Take 600 mg by mouth every 6 (six) hours as needed.   MAGNESIUM PO Take 250 mg by mouth at bedtime.   metFORMIN 500 MG 24 hr tablet Commonly known as:  GLUCOPHAGE-XR TAKE ONE TABLET BY MOUTH ONCE  DAILY FOR  BLOOD  SUGAR   montelukast 10 MG tablet Commonly known as:  SINGULAIR TAKE ONE TABLET BY MOUTH AT BEDTIME   MULTIPLE VITAMIN PO Take 1 tablet by mouth daily.   nitroGLYCERIN 0.3 MG SL tablet Commonly known as:  NITROSTAT Place 1 tablet (0.3 mg total) under the tongue every 5 (five) minutes as needed for chest pain.   PROAIR HFA 108 (90 Base) MCG/ACT inhaler Generic drug:  albuterol Inhale 2 puffs into the lungs every 6 (six) hours as needed.   triamterene-hydrochlorothiazide 37.5-25 MG tablet Commonly known as:  MAXZIDE-25 TAKE ONE TABLET BY MOUTH ONCE DAILY   Vitamin E 400 units Tabs Take 1 tablet by mouth daily.         DISCHARGE INSTRUCTIONS:   DIET:  Cardiac diet and Diabetic diet  DISCHARGE CONDITION:  Stable  ACTIVITY:  Activity as tolerated  OXYGEN:  Home Oxygen: No.   Oxygen Delivery: room air  DISCHARGE LOCATION:  home   If you experience worsening of your admission symptoms, develop shortness of breath, life threatening emergency, suicidal or homicidal thoughts you must seek medical attention immediately by calling 911 or calling your MD immediately  if symptoms less severe.  You Must read complete instructions/literature along with all the possible adverse reactions/side effects for all the Medicines you take and that have been prescribed to you. Take any new Medicines after you have completely understood and accpet all the possible adverse reactions/side effects.   Please note  You were cared for by a hospitalist during your hospital stay. If you have any questions about your discharge medications or the care you received while you were in the hospital after you are discharged, you can call the unit and asked to speak with the hospitalist on call if the hospitalist that took care of you is not available. Once you are discharged, your primary care physician will handle any further medical issues. Please note that NO REFILLS for any discharge  medications will be authorized once you are discharged, as it is imperative that you return to your primary care physician (or establish a relationship with a primary care physician if you do not have one) for your aftercare needs so that they can reassess your need for medications and monitor your lab values.     Today   No Headache, no numbness tingling or focal weakness. Chest pain resolved.  VITAL SIGNS:  Blood pressure (!) 125/46, pulse 72, temperature 98.3 F (36.8 C), temperature source Oral, resp. rate 18, height 5\' 2"  (1.575 m), weight 73.5 kg (162 lb), SpO2 96 %.  I/O:   Intake/Output Summary (Last 24 hours) at 01/06/17 1416 Last data filed at 01/06/17 0430  Gross per 24 hour  Intake          1037.33 ml  Output                0 ml  Net          1037.33 ml    PHYSICAL EXAMINATION:  GENERAL:  76 y.o.-year-old  patient lying in the bed with no acute distress.  EYES: Pupils equal, round, reactive to light and accommodation. No scleral icterus. Extraocular muscles intact.  HEENT: Head atraumatic, normocephalic. Oropharynx and nasopharynx clear.  NECK:  Supple, no jugular venous distention. No thyroid enlargement, no tenderness.  LUNGS: Normal breath sounds bilaterally, no wheezing, rales,rhonchi. No use of accessory muscles of respiration.  CARDIOVASCULAR: S1, S2 normal. No murmurs, rubs, or gallops.  ABDOMEN: Soft, non-tender, non-distended. Bowel sounds present. No organomegaly or mass.  EXTREMITIES: No pedal edema, cyanosis, or clubbing.  NEUROLOGIC: Cranial nerves II through XII are intact. No focal motor or sensory defecits b/l.  PSYCHIATRIC: The patient is alert and oriented x 3. Good affect.  SKIN: No obvious rash, lesion, or ulcer.   DATA REVIEW:   CBC  Recent Labs Lab 01/05/17 0706  WBC 8.3  HGB 13.3  HCT 38.0  PLT 238    Chemistries   Recent Labs Lab 01/05/17 0706 01/05/17 0756  NA 132*  --   K 2.8*  --   CL 96*  --   CO2 30  --   GLUCOSE 104*   --   BUN 27*  --   CREATININE 1.00  --   CALCIUM 8.2*  --   MG  --  1.6*    Cardiac Enzymes  Recent Labs Lab 01/06/17 1043  TROPONINI <0.03     RADIOLOGY:  Ct Head Wo Contrast  Result Date: 01/05/2017 CLINICAL DATA:  76 year old diabetic female with headache behind right eye starting yesterday. Nausea. Initial encounter. EXAM: CT HEAD WITHOUT CONTRAST TECHNIQUE: Contiguous axial images were obtained from the base of the skull through the vertex without intravenous contrast. COMPARISON:  11/11/2006 brain MR. No comparison head CT. FINDINGS: Brain: No intracranial hemorrhage or CT evidence of large acute infarct. Mild chronic microvascular changes. No hydrocephalus. No intracranial mass lesion noted on this unenhanced exam. 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 No secondary findings of pseudotumor cerebri. Incidentally noted are falx calcifications. Vascular: Very mild vascular calcifications. Skull: Negative. Sinuses/Orbits: Post lens replacement. No acute orbital abnormality noted. Polypoid opacification inferior aspect right maxillary sinus. Mild mucosal thickening left maxillary sinus. Mild polypoid opacification/ mucosal thickening mid to posterior ethmoid sinus air cells bilaterally. Mild mucosal thickening sphenoid sinus air cells greater on the left. Frontal sinuses are clear. Other: Mastoid air cells and middle ear cavities are clear. IMPRESSION: No intracranial hemorrhage or CT evidence of large acute infarct. 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 No secondary findings of pseudotumor cerebri. Polypoid opacification inferior aspect right maxillary sinus. Mild mucosal thickening left maxillary sinus. Mild polypoid opacification/ mucosal thickening mid to posterior ethmoid sinus air cells bilaterally. Mild mucosal thickening sphenoid sinus air cells greater on the left. Electronically Signed   By: Genia Del [name]   On: 01/05/2017 07:25   Mr Jodene Nam Head Wo Contrast  Result Date:  01/05/2017 CLINICAL DATA:  Abrupt onset of severe frontal headache. EXAM: MRI HEAD WITHOUT CONTRAST MRA HEAD WITHOUT CONTRAST TECHNIQUE: Multiplanar, multiecho pulse sequences of the brain and surrounding structures were obtained without intravenous contrast. Angiographic images of the head were obtained using MRA technique without contrast. COMPARISON:  11/11/2006 brain MRI FINDINGS: MRI HEAD FINDINGS Brain: Subcentimeter acute infarct in the right occipital white matter. Small cortically based infarct in the anterior right frontal lobe measuring up to 17 mm in length. Minimal for age white matter gliosis, mainly periventricular and likely microvascular. Normal brain volume. No chronic blood products. No hemorrhage, hydrocephalus, or masslike findings. Vascular:  Normal dural venous sinus flow voids. Arterial findings below. Skull and upper cervical spine: Generalized hypointense appearance of marrow, stable from prior. No focal lesions seen. Sinuses/Orbits: Mild mucosal thickening in the ethmoids. Mucous retention cysts in the right maxillary sinus. MRA HEAD FINDINGS Symmetric carotid arteries, tortuous before the skullbase. There is poor signal about the bilateral MCA bifurcations that is attributed to artifact from end of slab imaging and vessel direction. No suspected superimposed flow limiting stenosis or occlusion on source images. Negative for aneurysm. Symmetric poor signal in the distal vertebral arteries is attributed to artifact. There is a convincing moderate left P2 segment stenosis. No branch occlusion suspected. Negative for aneurysm. A small right posterior communicating artery is present. IMPRESSION: 1. Small acute infarcts in the right frontal cortex and right occipital white matter. 2. Allowing for artifact, no acute or reversal finding on intracranial MRA. 3. Moderate left P2 segment stenosis. Electronically Signed   By: Monte Fantasia [name]   On: 01/05/2017 10:42   Mr Brain Wo  Contrast  Result Date: 01/05/2017 CLINICAL DATA:  Abrupt onset of severe frontal headache. EXAM: MRI HEAD WITHOUT CONTRAST MRA HEAD WITHOUT CONTRAST TECHNIQUE: Multiplanar, multiecho pulse sequences of the brain and surrounding structures were obtained without intravenous contrast. Angiographic images of the head were obtained using MRA technique without contrast. COMPARISON:  11/11/2006 brain MRI FINDINGS: MRI HEAD FINDINGS Brain: Subcentimeter acute infarct in the right occipital white matter. Small cortically based infarct in the anterior right frontal lobe measuring up to 17 mm in length. Minimal for age white matter gliosis, mainly periventricular and likely microvascular. Normal brain volume. No chronic blood products. No hemorrhage, hydrocephalus, or masslike findings. Vascular: Normal dural venous sinus flow voids. Arterial findings below. Skull and upper cervical spine: Generalized hypointense appearance of marrow, stable from prior. No focal lesions seen. Sinuses/Orbits: Mild mucosal thickening in the ethmoids. Mucous retention cysts in the right maxillary sinus. MRA HEAD FINDINGS Symmetric carotid arteries, tortuous before the skullbase. There is poor signal about the bilateral MCA bifurcations that is attributed to artifact from end of slab imaging and vessel direction. No suspected superimposed flow limiting stenosis or occlusion on source images. Negative for aneurysm. Symmetric poor signal in the distal vertebral arteries is attributed to artifact. There is a convincing moderate left P2 segment stenosis. No branch occlusion suspected. Negative for aneurysm. A small right posterior communicating artery is present. IMPRESSION: 1. Small acute infarcts in the right frontal cortex and right occipital white matter. 2. Allowing for artifact, no acute or reversal finding on intracranial MRA. 3. Moderate left P2 segment stenosis. Electronically Signed   By: Monte Fantasia [name]   On: 01/05/2017 10:42   US  Carotid Bilateral (at [facility] Only)  Result Date: 01/05/2017 CLINICAL DATA:  76 year old female with stroke-like symptoms EXAM: BILATERAL CAROTID DUPLEX ULTRASOUND TECHNIQUE: Pearline Cables scale imaging, color Doppler and duplex ultrasound were performed of bilateral carotid and vertebral arteries in the neck. COMPARISON:  Prior duplex carotid ultrasound 11/25/2006 FINDINGS: Criteria: Quantification of carotid stenosis is based on velocity parameters that correlate the residual internal carotid diameter with NASCET-based stenosis levels, using the diameter of the distal internal carotid lumen as the denominator for stenosis measurement. The following velocity measurements were obtained: RIGHT ICA:  83/19 cm/sec CCA:  56/38 cm/sec SYSTOLIC ICA/CCA RATIO:  0.9 DIASTOLIC ICA/CCA RATIO:  1.8 ECA:  145 cm/sec LEFT ICA:  86/13 cm/sec CCA:  937/34 cm/sec SYSTOLIC ICA/CCA RATIO:  0.8 DIASTOLIC ICA/CCA RATIO:  1.0 ECA:  122 cm/sec RIGHT CAROTID ARTERY: Tortuous anatomy. No significant atherosclerotic plaque or evidence of stenosis. RIGHT VERTEBRAL ARTERY:  Patent with normal antegrade flow. LEFT CAROTID ARTERY: Mild focal heterogeneous atherosclerotic plaque in the proximal internal carotid artery. By peak systolic velocity criteria the estimated stenosis remains less than 50%. LEFT VERTEBRAL ARTERY:  Patent with normal antegrade flow. IMPRESSION: 1. 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 2. No significant atherosclerotic plaque or evidence of stenosis in the right internal carotid artery. 3. Vertebral arteries are patent with normal antegrade flow. Signed, Criselda Peaches, MD [facility] Electronically Signed   By: Jacqulynn Cadet [name]   On: 01/05/2017 16:55      Management plans discussed with the patient, family and they are in agreement.  CODE STATUS:     Code Status Orders         Start     Ordered   01/05/17 1402  Full code  Continuous     01/05/17 1401    Code Status History    Date Active Date Inactive Code Status Order ID Comments User Context   This patient has a current code status but no historical code status.      TOTAL TIME TAKING CARE OF THIS PATIENT: 40 minutes.    Henreitta Leber M.D on 01/06/2017 at 2:16 PM  Between 7am to 6pm - Pager - [phone]  After 6pm go to [email or web address] - Proofreader  [facility]  Office  [phone]  CC: Primary care physician; Birdie Sons, MD

## 2017-01-06 NOTE — Progress Notes (Signed)
Dr. Marcille Blanco paged re: EKG results, included info re: difference from prior EKG.

## 2017-01-06 NOTE — Progress Notes (Signed)
OT Cancellation Note  Patient Details Name: GENNI BUSKE MRN: [id number] DOB: [birth year]   Cancelled Treatment:     OT  Order received, screened patient for OT, she is getting ready to be discharged and feels she is back to normal with no complaints and no OT needs at this time.  If any problems resurface, please reconsult OT for further evaluation.  Will discharge/complete OT order at this time. Amy T Lovett, OTR/L, CLT   Lovett,Amy 01/06/2017, 3:16 PM

## 2017-01-06 NOTE — Plan of Care (Signed)
Problem: Education: Goal: Knowledge of [facility] Education information/materials will improve Outcome: Progressing Tmax 37.8, improved to 37.2 s/p PRN PO Tylenol 650mg .  Dr. Marcille Blanco paged, no additional orders.  No other needs overnight.  VSS otherwise.  Denies pain.  NIH 0, neuro checks stable, benign.  Bed in low position, call bell within reach.  WCTM.

## 2017-01-06 NOTE — Progress Notes (Signed)
Dr. Marcille Blanco paged re: EKG results: sinus brady, possible anterior infarct, age undetermined, new from prior EKG 7/2.

## 2017-01-06 NOTE — Progress Notes (Signed)
  Speech Language Pathology Treatment: Dysphagia  Patient Details Name: Destiny Gilbert MRN: [id number] DOB: 1940/08/24 Today's Date: 01/06/2017 Time: 9326-7124 SLP Time Calculation (min) (ACUTE ONLY): 25 min  Assessment / Plan / Recommendation Clinical Impression  Pt seen for f/u w/ education on Esophageal dysmotility; swallowing issues that she feels bothers her "sometimes". 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 Pt does have report of dysmotility when eating out at restaurants and "being distracted". Pt and husband denied this occurred w/ consistency but felt it happened more frequently when eating out at restaurants. Pt and husband were given brief education on Esophageal phase dysphagia; strategies to help compensate; diet consistency recommendations; food preparation and options. Also recommended general aspiration precautions. This precautions and strategies were discussed and handouts given to pt/husband. Recommended f/u w/ GI for further management and education re: Esophageal phase dysphagia. NSG updated.    HPI HPI: Pt is a 76 y.o. female with a known history of Hypertension, diabetes, pneumonia and PUD. The patient to present to the ED with the above chief complaints. She complains of right-sided headache yesterday morning, which resolved but come back this morning. She denies any dizziness, double vision, blurry vision, slurred speech, dysphagia or incontinence. She denies any weakness or numbness. 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 Pt does have report of dysmotility when eating out at restaurants and "being distracted". Pt and husband denied this occurred w/ consistency but felt it happened more frequently when eating out at restaurants. BSE completed which did not indicate overt s/s of aspiration or oropharyngeal phase dysphagia. Pt could benefit from f/u education re: Esophageal motility and diet consistency recommendations/strategies as well as f/u w/ GI for management.      SLP Plan  All goals met       Recommendations  Diet recommendations: Dysphagia 3 (mechanical soft);Regular;Thin liquid (moistened foods) Liquids provided via: Cup;Straw Medication Administration: Whole meds with liquid (if easier for swallowing and clearing Esophageally) Supervision: Patient able to self feed Compensations: Minimize environmental distractions;Slow rate;Small sips/bites;Lingual sweep for clearance of pocketing;Multiple dry swallows after each bite/sip;Follow solids with liquid Postural Changes and/or Swallow Maneuvers: Seated upright 90 degrees;Upright 30-60 min after meal (Reflux precautions)                General recommendations:  (GI consult for management) Oral Care Recommendations: Oral care BID;Patient independent with oral care Follow up Recommendations: None SLP Visit Diagnosis: Dysphagia, pharyngoesophageal phase (R13.14);Dysphagia, unspecified (R13.10) (Esophageal phase) Plan: All goals met       GO                 [name], MS, CCC-SLP Watson,Katherine 01/06/2017, 3:30 PM

## 2017-01-06 NOTE — Progress Notes (Signed)
*  PRELIMINARY RESULTS* Echocardiogram 2D Echocardiogram has been performed.  Destiny Gilbert 01/06/2017, 10:04 AM

## 2017-01-06 NOTE — Evaluation (Signed)
Physical Therapy Evaluation Patient Details Name: Destiny Gilbert MRN: [id number] DOB: Dec 03, 1940 Today's Date: 01/06/2017   History of Present Illness  Pt is a 76 y.o. female presenting to hospital with HA behind R eye.  MRI showing small acute infarcts in R frontal cortex and R occipital white matter; also L P2 stenosis.  EKG results show sinus brady, possible anterior infarct, age indetermined, new from prior EKG 7/2.  PMH includes colonoscopy 12/23/16, DM, PUD.  Clinical Impression  Pt demonstrates steady independent functional mobility without assistive device.  No loss of balance noted with ambulation and dynamic balance activities.  No c/o pain during session and no visual concerns noted with functional mobility.  LE strength, sensation, coordination, proprioception, and tone intact.  No acute PT needs identified; will discharge pt in house.  Please re-consult PT if pt's status changes and acute PT needs are identified.     Follow Up Recommendations No PT follow up    Equipment Recommendations  None recommended by PT    Recommendations for Other Services       Precautions / Restrictions Precautions Precautions: None Restrictions Weight Bearing Restrictions: No      Mobility  Bed Mobility Overal bed mobility: Independent             General bed mobility comments: Supine to/from sit without any difficulty  Transfers Overall transfer level: Independent Equipment used: None             General transfer comment: steady without loss of balance  Ambulation/Gait Ambulation/Gait assistance: Independent Ambulation Distance (Feet): 220 Feet Assistive device: None Gait Pattern/deviations: WFL(Within Functional Limits)   Gait velocity interpretation: at or above normal speed for age/gender General Gait Details: steady without loss of balance  Stairs Stairs: Yes Stairs assistance: Supervision Stair Management: One rail Left;Alternating pattern;Forwards Number of  Stairs: 3 General stair comments: steady safe stairs navigation  Wheelchair Mobility    Modified Rankin (Stroke Patients Only)       Balance Overall balance assessment: Independent (No loss of balance with ambulation and head turns R/L/up/down, increasing/decreasing speed, and turning and stopping)                                           Pertinent Vitals/Pain Pain Assessment: No/denies pain  Vitals (HR and O2 on room air) stable and WFL throughout treatment session.    Home Living Family/patient expects to be discharged to:: Private residence Living Arrangements: Spouse/significant other Available Help at Discharge: Family Type of Home: House Home Access: Stairs to enter   [employer] of Steps: 3 steps to enter with L hand hold Home Layout: One level Home Equipment: None      Prior Function Level of Independence: Independent         Comments: Pt denies any falls in past 6 months.     Hand Dominance        Extremity/Trunk Assessment   Upper Extremity Assessment Upper Extremity Assessment: Overall WFL for tasks assessed    Lower Extremity Assessment Lower Extremity Assessment:  (B hip flexion, knee flexion/extension, and DF 5/5 strength; intact B LE proprioception, tone, and coordination (heel to shin))    Cervical / Trunk Assessment Cervical / Trunk Assessment: Normal  Communication   Communication: No difficulties  Cognition Arousal/Alertness: Awake/alert Behavior During Therapy: WFL for tasks assessed/performed Overall Cognitive Status: Within Functional Limits for tasks assessed  General Comments General comments (skin integrity, edema, etc.): Pt resting in bed with husband present upon PT arrival.  Nursing cleared pt for participation in physical therapy.  Pt agreeable to PT session.    Exercises     Assessment/Plan    PT Assessment Patent does not need any  further PT services  PT Problem List         PT Treatment Interventions      PT Goals (Current goals can be found in the Care Plan section)  Acute Rehab PT Goals Patient Stated Goal: to go home PT Goal Formulation: With patient Time For Goal Achievement: 01/20/17 Potential to Achieve Goals: Good    Frequency     Barriers to discharge        Co-evaluation               AM-PAC PT "6 Clicks" Daily Activity  Outcome Measure Difficulty turning over in bed (including adjusting bedclothes, sheets and blankets)?: None Difficulty moving from lying on back to sitting on the side of the bed? : None Difficulty sitting down on and standing up from a chair with arms (e.g., wheelchair, bedside commode, etc,.)?: None Help needed moving to and from a bed to chair (including a wheelchair)?: None Help needed walking in hospital room?: None Help needed climbing 3-5 steps with a railing? : A Little 6 Click Score: 23    End of Session Equipment Utilized During Treatment: Gait belt Activity Tolerance: Patient tolerated treatment well Patient left: in bed;with family/visitor present Nurse Communication: Mobility status;Precautions PT Visit Diagnosis: Difficulty in walking, not elsewhere classified (R26.2)    Time: 1150-1210 PT Time Calculation (min) (ACUTE ONLY): 20 min   Charges:   PT Evaluation $PT Eval Low Complexity: 1 Procedure     PT G CodesLeitha Bleak, PT 01/06/17, 1:01 PM [phone]

## 2017-01-06 NOTE — Consult Note (Signed)
Reason for Consult:Headache  Referring Physician: Dr. Verdell Carmine   CC: headache   HPI: Destiny Gilbert is an 76 y.o. female n history of Hypertension, diabetes, pneumonia  The patient to present to the ED with 1 day hx of of headache behind her Eye. She denies any dizziness, double vision, blurry vision, slurred speech, dysphagia or incontinence.  Currently back to baseline.  Last time serious headache was about 30 yrs ago.  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    Past Medical History:  Diagnosis Date  . Diabetes mellitus without complication ([place])   . History of peptic ulcer    perforated  . Pneumonia 01/23/2015   [facility]     Past Surgical History:  Procedure Laterality Date  . ABDOMINAL HYSTERECTOMY  1994   BSO  . CATARACT EXTRACTION     left eye- 09/2014; righr eye- 08/13/2014  . COLONOSCOPY WITH PROPOFOL N/A 12/23/2016   Procedure: COLONOSCOPY WITH PROPOFOL;  Surgeon: Lollie Sails, MD;  Location: [facility] ENDOSCOPY;  Service: Endoscopy;  Laterality: N/A;    Family History  Problem Relation Age of Onset  . Lung cancer Mother   . Pancreatic cancer Father   . Hypertension Sister   . Hyperlipidemia Sister   . Breast cancer Neg Hx     Social History:  reports that she has never smoked. She has never used smokeless tobacco. She reports that she drinks alcohol. She reports that she does not use drugs.  Allergies  Allergen Reactions  . Sulfa Antibiotics Nausea And Vomiting    Medications: I have reviewed the patient's current medications.  ROS: History obtained from the patient  General ROS: negative for - chills, fatigue, fever, night sweats, weight gain or weight loss Psychological ROS: negative for - behavioral disorder, hallucinations, memory difficulties, mood swings or suicidal ideation Ophthalmic ROS: negative for - blurry vision, double vision, eye pain or loss of vision ENT ROS: negative for - epistaxis, nasal discharge, oral lesions,  sore throat, tinnitus or vertigo Allergy and Immunology ROS: negative for - hives or itchy/watery eyes Hematological and Lymphatic ROS: negative for - bleeding problems, bruising or swollen lymph nodes Endocrine ROS: negative for - galactorrhea, hair pattern changes, polydipsia/polyuria or temperature intolerance Respiratory ROS: negative for - cough, hemoptysis, shortness of breath or wheezing Cardiovascular ROS: negative for - chest pain, dyspnea on exertion, edema or irregular heartbeat Gastrointestinal ROS: negative for - abdominal pain, diarrhea, hematemesis, nausea/vomiting or stool incontinence Genito-Urinary ROS: negative for - dysuria, hematuria, incontinence or urinary frequency/urgency Musculoskeletal ROS: negative for - joint swelling or muscular weakness Neurological ROS: as noted in HPI Dermatological ROS: negative for rash and skin lesion changes  Physical Examination: Blood pressure (!) 121/54, pulse 66, temperature 98.4 F (36.9 C), temperature source Oral, resp. rate 18, height 5\' 2"  (1.575 m), weight 73.5 kg (162 lb), SpO2 96 %.    Neurological Examination   Mental Status: Alert, oriented, thought content appropriate.  Speech fluent without evidence of aphasia.  Able to follow 3 step commands without difficulty. Cranial Nerves: II: Discs flat bilaterally; Visual fields grossly normal, pupils equal, round, reactive to light and accommodation III,IV, VI: ptosis not present, extra-ocular motions intact bilaterally V,VII: smile symmetric, facial light touch sensation normal bilaterally VIII: hearing normal bilaterally IX,X: gag reflex present XI: bilateral shoulder shrug XII: midline tongue extension Motor: Right : Upper extremity   5/5    Left:     Upper extremity   5/5  Lower extremity   5/5     Lower extremity   5/5 Tone and bulk:normal tone throughout; no atrophy noted Sensory: Pinprick and light touch intact throughout, bilaterally Deep Tendon Reflexes: 1+ and  symmetric throughout Plantars: Right: downgoing   Left: downgoing Cerebellar: normal finger-to-nose, normal rapid alternating movements and normal heel-to-shin test Gait: not tested       Laboratory Studies:   Basic Metabolic Panel:  Recent Labs Lab 01/05/17 0706 01/05/17 0756  NA 132*  --   K 2.8*  --   CL 96*  --   CO2 30  --   GLUCOSE 104*  --   BUN 27*  --   CREATININE 1.00  --   CALCIUM 8.2*  --   MG  --  1.6*    Liver Function Tests: No results for input(s): AST, ALT, ALKPHOS, BILITOT, PROT, ALBUMIN in the last 168 hours. No results for input(s): LIPASE, AMYLASE in the last 168 hours. No results for input(s): AMMONIA in the last 168 hours.  CBC:  Recent Labs Lab 01/05/17 0706  WBC 8.3  NEUTROABS 4.6  HGB 13.3  HCT 38.0  MCV 95.1  PLT 238    Cardiac Enzymes:  Recent Labs Lab 01/06/17 0647  TROPONINI <0.03    BNP: Invalid input(s): POCBNP  CBG:  Recent Labs Lab 01/05/17 1649 01/05/17 2025 01/06/17 0753  GLUCAP 89 150* 102*    Microbiology: Results for orders placed or performed in visit on 03/25/16  Urine culture     Status: Abnormal   Collection Time: 03/25/16 12:00 AM  Result Value Ref Range Status   Urine Culture, Routine Final report (A)  Final   Organism ID, Bacteria Escherichia coli (A)  Final    Comment: Greater than 100,000 colony forming units per mL Cefazolin <=4 ug/mL Cefazolin with an MIC <=16 predicts susceptibility to the oral agents cefaclor, cefdinir, cefpodoxime, cefprozil, cefuroxime, cephalexin, and loracarbef when used for therapy of uncomplicated urinary tract infections due to E. coli, Klebsiella pneumoniae, and Proteus mirabilis.    Antimicrobial Susceptibility Comment  Final    Comment:       ** S = Susceptible; I = Intermediate; R = Resistant **                    P = Positive; N = Negative             MICS are expressed in micrograms per mL    Antibiotic                 RSLT#1    RSLT#2    RSLT#3     RSLT#4 Amoxicillin/Clavulanic Acid    I Ampicillin                     R Cefepime                       S Ceftriaxone                    S Cefuroxime                     S Cephalothin                    I Ciprofloxacin                  S Ertapenem  S Gentamicin                     S Imipenem                       S Levofloxacin                   S Nitrofurantoin                 S Piperacillin                   R Tetracycline                   S Tobramycin                     S Trimethoprim/Sulfa             S     Coagulation Studies:  Recent Labs  01/05/17 0706  LABPROT 13.4  INR 1.02    Urinalysis: No results for input(s): COLORURINE, LABSPEC, PHURINE, GLUCOSEU, HGBUR, BILIRUBINUR, KETONESUR, PROTEINUR, UROBILINOGEN, NITRITE, LEUKOCYTESUR in the last 168 hours.  Invalid input(s): APPERANCEUR  Lipid Panel:     Component Value Date/Time   CHOL 176 01/06/2017 0448   CHOL 180 11/15/2015 0815   TRIG 219 (H) 01/06/2017 0448   HDL 53 01/06/2017 0448   HDL 51 11/15/2015 0815   CHOLHDL 3.3 01/06/2017 0448   VLDL 44 (H) 01/06/2017 0448   LDLCALC 79 01/06/2017 0448   LDLCALC 94 11/15/2015 0815    HgbA1C:  Lab Results  Component Value Date   HGBA1C 6.2 09/11/2016    Urine Drug Screen:  No results found for: LABOPIA, COCAINSCRNUR, LABBENZ, AMPHETMU, THCU, LABBARB  Alcohol Level: No results for input(s): ETH in the last 168 hours.  Imaging: Ct Head Wo Contrast  Result Date: 01/05/2017 CLINICAL DATA:  76 year old diabetic female with headache behind right eye starting yesterday. Nausea. Initial encounter. EXAM: CT HEAD WITHOUT CONTRAST TECHNIQUE: Contiguous axial images were obtained from the base of the skull through the vertex without intravenous contrast. COMPARISON:  11/11/2006 brain MR. No comparison head CT. FINDINGS: Brain: No intracranial hemorrhage or CT evidence of large acute infarct. Mild chronic microvascular changes. No hydrocephalus. No  intracranial mass lesion noted on this unenhanced exam. 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 No secondary findings of pseudotumor cerebri. Incidentally noted are falx calcifications. Vascular: Very mild vascular calcifications. Skull: Negative. Sinuses/Orbits: Post lens replacement. No acute orbital abnormality noted. Polypoid opacification inferior aspect right maxillary sinus. Mild mucosal thickening left maxillary sinus. Mild polypoid opacification/ mucosal thickening mid to posterior ethmoid sinus air cells bilaterally. Mild mucosal thickening sphenoid sinus air cells greater on the left. Frontal sinuses are clear. Other: Mastoid air cells and middle ear cavities are clear. IMPRESSION: No intracranial hemorrhage or CT evidence of large acute infarct. 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 No secondary findings of pseudotumor cerebri. Polypoid opacification inferior aspect right maxillary sinus. Mild mucosal thickening left maxillary sinus. Mild polypoid opacification/ mucosal thickening mid to posterior ethmoid sinus air cells bilaterally. Mild mucosal thickening sphenoid sinus air cells greater on the left. Electronically Signed   By: Genia Del [name]   On: 01/05/2017 07:25   Mr Jodene Nam Head Wo Contrast  Result Date: 01/05/2017 CLINICAL DATA:  Abrupt onset of severe frontal headache. EXAM: MRI HEAD WITHOUT CONTRAST MRA HEAD WITHOUT CONTRAST TECHNIQUE: Multiplanar, multiecho pulse sequences of the brain and surrounding  structures were obtained without intravenous contrast. Angiographic images of the head were obtained using MRA technique without contrast. COMPARISON:  11/11/2006 brain MRI FINDINGS: MRI HEAD FINDINGS Brain: Subcentimeter acute infarct in the right occipital white matter. Small cortically based infarct in the anterior right frontal lobe measuring up to 17 mm in length. Minimal for age white matter gliosis, mainly periventricular and likely microvascular. Normal brain volume. No chronic blood  products. No hemorrhage, hydrocephalus, or masslike findings. Vascular: Normal dural venous sinus flow voids. Arterial findings below. Skull and upper cervical spine: Generalized hypointense appearance of marrow, stable from prior. No focal lesions seen. Sinuses/Orbits: Mild mucosal thickening in the ethmoids. Mucous retention cysts in the right maxillary sinus. MRA HEAD FINDINGS Symmetric carotid arteries, tortuous before the skullbase. There is poor signal about the bilateral MCA bifurcations that is attributed to artifact from end of slab imaging and vessel direction. No suspected superimposed flow limiting stenosis or occlusion on source images. Negative for aneurysm. Symmetric poor signal in the distal vertebral arteries is attributed to artifact. There is a convincing moderate left P2 segment stenosis. No branch occlusion suspected. Negative for aneurysm. A small right posterior communicating artery is present. IMPRESSION: 1. Small acute infarcts in the right frontal cortex and right occipital white matter. 2. Allowing for artifact, no acute or reversal finding on intracranial MRA. 3. Moderate left P2 segment stenosis. Electronically Signed   By: Monte Fantasia [name]   On: 01/05/2017 10:42   Mr Brain Wo Contrast  Result Date: 01/05/2017 CLINICAL DATA:  Abrupt onset of severe frontal headache. EXAM: MRI HEAD WITHOUT CONTRAST MRA HEAD WITHOUT CONTRAST TECHNIQUE: Multiplanar, multiecho pulse sequences of the brain and surrounding structures were obtained without intravenous contrast. Angiographic images of the head were obtained using MRA technique without contrast. COMPARISON:  11/11/2006 brain MRI FINDINGS: MRI HEAD FINDINGS Brain: Subcentimeter acute infarct in the right occipital white matter. Small cortically based infarct in the anterior right frontal lobe measuring up to 17 mm in length. Minimal for age white matter gliosis, mainly periventricular and likely microvascular. Normal brain volume. No  chronic blood products. No hemorrhage, hydrocephalus, or masslike findings. Vascular: Normal dural venous sinus flow voids. Arterial findings below. Skull and upper cervical spine: Generalized hypointense appearance of marrow, stable from prior. No focal lesions seen. Sinuses/Orbits: Mild mucosal thickening in the ethmoids. Mucous retention cysts in the right maxillary sinus. MRA HEAD FINDINGS Symmetric carotid arteries, tortuous before the skullbase. There is poor signal about the bilateral MCA bifurcations that is attributed to artifact from end of slab imaging and vessel direction. No suspected superimposed flow limiting stenosis or occlusion on source images. Negative for aneurysm. Symmetric poor signal in the distal vertebral arteries is attributed to artifact. There is a convincing moderate left P2 segment stenosis. No branch occlusion suspected. Negative for aneurysm. A small right posterior communicating artery is present. IMPRESSION: 1. Small acute infarcts in the right frontal cortex and right occipital white matter. 2. Allowing for artifact, no acute or reversal finding on intracranial MRA. 3. Moderate left P2 segment stenosis. Electronically Signed   By: Monte Fantasia [name]   On: 01/05/2017 10:42   US Carotid Bilateral (at [facility] Only)  Result Date: 01/05/2017 CLINICAL DATA:  76 year old female with stroke-like symptoms EXAM: BILATERAL CAROTID DUPLEX ULTRASOUND TECHNIQUE: Pearline Cables scale imaging, color Doppler and duplex ultrasound were performed of bilateral carotid and vertebral arteries in the neck. COMPARISON:  Prior duplex carotid ultrasound 11/25/2006 FINDINGS: Criteria: Quantification of carotid stenosis is based  on velocity parameters that correlate the residual internal carotid diameter with NASCET-based stenosis levels, using the diameter of the distal internal carotid lumen as the denominator for stenosis measurement. The following velocity measurements were obtained: RIGHT ICA:  83/19  cm/sec CCA:  35/32 cm/sec SYSTOLIC ICA/CCA RATIO:  0.9 DIASTOLIC ICA/CCA RATIO:  1.8 ECA:  145 cm/sec LEFT ICA:  86/13 cm/sec CCA:  992/42 cm/sec SYSTOLIC ICA/CCA RATIO:  0.8 DIASTOLIC ICA/CCA RATIO:  1.0 ECA:  122 cm/sec RIGHT CAROTID ARTERY: Tortuous anatomy. No significant atherosclerotic plaque or evidence of stenosis. RIGHT VERTEBRAL ARTERY:  Patent with normal antegrade flow. LEFT CAROTID ARTERY: Mild focal heterogeneous atherosclerotic plaque in the proximal internal carotid artery. By peak systolic velocity criteria the estimated stenosis remains less than 50%. LEFT VERTEBRAL ARTERY:  Patent with normal antegrade flow. IMPRESSION: 1. 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 2. No significant atherosclerotic plaque or evidence of stenosis in the right internal carotid artery. 3. Vertebral arteries are patent with normal antegrade flow. Signed, Criselda Peaches, MD [facility] Electronically Signed   By: Jacqulynn Cadet [name]   On: 01/05/2017 16:55     Assessment/Plan:  76 y.o. female n history of Hypertension, diabetes, pneumonia  The patient to present to the ED with 1 day hx of of headache behind her Eye. She denies any dizziness, double vision, blurry vision, slurred speech, dysphagia or incontinence.  Currently back to baseline.  Last time serious headache was about 30 yrs ago.  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    Pt is back to baseline Strokes likely in setting of small vessel disease. I don't think its cardioembolic Pt was on ASA 81 daily.  Increased to ASA 325 daily D/c planning if cleared by pt/ot 01/06/2017, 10:44 AM

## 2017-01-06 NOTE — Progress Notes (Addendum)
Dr. Marcille Blanco paged @ [phone], [phone] d/t pt report of chest "pressure", nausea.  VS stable, tele reading sinus rhythm.  12-lead EKG ordered.

## 2017-01-06 NOTE — Telephone Encounter (Signed)
Patient is been discharged to day from [facility] for CVA, appointment to follow up scheduled for 01/15/17 with Dr Vincente Liberty

## 2017-01-07 LAB — HEMOGLOBIN A1C
Hgb A1c MFr Bld: 6.3 % — ABNORMAL HIGH (ref 4.8–5.6)
Mean Plasma Glucose: 134 mg/dL

## 2017-01-09 DIAGNOSIS — R079 Chest pain, unspecified: Secondary | ICD-10-CM | POA: Diagnosis not present

## 2017-01-09 DIAGNOSIS — E7800 Pure hypercholesterolemia, unspecified: Secondary | ICD-10-CM | POA: Diagnosis not present

## 2017-01-09 DIAGNOSIS — I63532 Cerebral infarction due to unspecified occlusion or stenosis of left posterior cerebral artery: Secondary | ICD-10-CM | POA: Diagnosis not present

## 2017-01-13 ENCOUNTER — Encounter: Payer: Self-pay | Admitting: Family Medicine

## 2017-01-15 ENCOUNTER — Ambulatory Visit (INDEPENDENT_AMBULATORY_CARE_PROVIDER_SITE_OTHER): Payer: PPO | Admitting: Family Medicine

## 2017-01-15 ENCOUNTER — Encounter: Payer: Self-pay | Admitting: Family Medicine

## 2017-01-15 VITALS — BP 130/70 | HR 71 | Temp 98.2°F | Resp 16 | Wt 161.0 lb

## 2017-01-15 DIAGNOSIS — I679 Cerebrovascular disease, unspecified: Secondary | ICD-10-CM | POA: Insufficient documentation

## 2017-01-15 DIAGNOSIS — B370 Candidal stomatitis: Secondary | ICD-10-CM

## 2017-01-15 DIAGNOSIS — I639 Cerebral infarction, unspecified: Secondary | ICD-10-CM | POA: Diagnosis not present

## 2017-01-15 MED ORDER — NYSTATIN 100000 UNIT/ML MT SUSP: 5.0000 mL | Freq: Four times a day (QID) | OROMUCOSAL | 0 refills | Status: DC

## 2017-01-15 MED ORDER — ASPIRIN-DIPYRIDAMOLE ER 25-200 MG PO CP12: 1.0000 | ORAL_CAPSULE | Freq: Two times a day (BID) | ORAL | 5 refills | Status: DC

## 2017-01-15 NOTE — Progress Notes (Signed)
Patient: Destiny Gilbert Female    DOB: 08-11-1940   76 y.o.   MRN: [id number] Visit Date: 01/15/2017  Today's Provider: Lelon Huh, MD   Chief Complaint  Patient presents with  . Hospitalization Follow-up   Subjective:    HPI  Follow up Hospitalization  Patient was admitted to [facility] on 01/05/2017 and discharged on 01/06/2017. She initially presented to ER on day of admission with acute onset of headache above right eye. She was treated for acute ischemic CVA. Treatment for this included increasing Aspirin from 81mg  to 325mg . Lovastatin was changed to atorvastatin. An outpatient stress test with Cardiology  was recommended after discharge. Since discharge, patient has been seen by Cardiologist, Dr. Saralyn Pilar on 01/15/2017.Marland KitchenMyoview was recommended Patient states her next follow up with Cardiology is in 6 weeks when she anticipates having stress test.  She reports good compliance with treatment. She reports this condition is Improved.  A1c was checked and was 6.3  ------------------------------------------------------------------------------------       Allergies  Allergen Reactions  . Sulfa Antibiotics Nausea And Vomiting     Current Outpatient Prescriptions:  .  acetaminophen (TYLENOL) 500 MG tablet, Take 500 mg by mouth every 6 (six) hours as needed., Disp: , Rfl:  .  albuterol (PROAIR HFA) 108 (90 BASE) MCG/ACT inhaler, Inhale 2 puffs into the lungs every 6 (six) hours as needed., Disp: , Rfl:  .  aspirin EC 325 MG tablet, Take 1 tablet (325 mg total) by mouth daily., Disp: 30 tablet, Rfl: 1 .  atorvastatin (LIPITOR) 40 MG tablet, Take 1 tablet (40 mg total) by mouth daily at 6 PM., Disp: 30 tablet, Rfl: 1 .  glucose blood (ONE TOUCH ULTRA TEST) test strip, Use to check blood sugar daily, Disp: 100 each, Rfl: 12 .  ibuprofen (ADVIL,MOTRIN) 600 MG tablet, Take 600 mg by mouth every 6 (six) hours as needed., Disp: , Rfl:  .  MAGNESIUM PO, Take 250 mg by mouth at  bedtime., Disp: , Rfl:  .  metFORMIN (GLUCOPHAGE-XR) 500 MG 24 hr tablet, TAKE ONE TABLET BY MOUTH ONCE DAILY FOR  BLOOD  SUGAR, Disp: 30 tablet, Rfl: 12 .  MULTIPLE VITAMIN PO, Take 1 tablet by mouth daily., Disp: , Rfl:  .  nitroGLYCERIN (NITROSTAT) 0.3 MG SL tablet, Place 1 tablet (0.3 mg total) under the tongue every 5 (five) minutes as needed for chest pain., Disp: 90 tablet, Rfl: 0 .  Omega-3 Fatty Acids (FISH OIL) 1200 MG CAPS, Take 1 capsule by mouth daily. , Disp: , Rfl:  .  triamterene-hydrochlorothiazide (MAXZIDE-25) 37.5-25 MG tablet, TAKE ONE TABLET BY MOUTH ONCE DAILY, Disp: 90 tablet, Rfl: 4 .  Vitamin E 400 UNITS TABS, Take 1 tablet by mouth daily., Disp: , Rfl:  .  ADVAIR DISKUS 250-50 MCG/DOSE AEPB, INHALE ONE DOSE BY MOUTH TWICE DAILY (Patient not taking: Reported on 01/15/2017), Disp: 60 each, Rfl: 12 .  montelukast (SINGULAIR) 10 MG tablet, TAKE ONE TABLET BY MOUTH AT BEDTIME (Patient not taking: Reported on 01/15/2017), Disp: 90 tablet, Rfl: 4  Review of Systems  Constitutional: Negative for appetite change, chills, fatigue and fever.  Respiratory: Negative for chest tightness and shortness of breath.   Cardiovascular: Negative for chest pain and palpitations.  Gastrointestinal: Negative for abdominal pain, nausea and vomiting.  Neurological: Negative for dizziness and weakness.    Social History  Substance Use Topics  . Smoking status: Never Smoker  . Smokeless tobacco: Never Used  . Alcohol  use 0.0 oz/week     Comment: has 3 ounces of wine each week   Objective:   BP 130/70 (BP Location: Left Arm, Patient Position: Sitting, Cuff Size: Large)   Pulse 71   Temp 98.2 F (36.8 C) (Oral)   Resp 16   Wt 161 lb (73 kg)   SpO2 97% Comment: room air  BMI 29.45 kg/m  There were no vitals filed for this visit.   Physical Exam   General Appearance:    Alert, cooperative, no distress  Eyes:    PERRL, conjunctiva/corneas clear, EOM's intact       Lungs:     Clear to  auscultation bilaterally, respirations unlabored  Heart:    Regular rate and rhythm  Neurologic:   Awake, alert, oriented x 3. No apparent focal neurological           defect.           Assessment & Plan:     1. Cerebrovascular disease Recent CVA occurring while on low dose aspirin. Discusses risk and benefit of changing to to aggrenox versus regular strength aspirin. Will try Aggrenox. Is tolerating change to high intensity statin.  - dipyridamole-aspirin (AGGRENOX) 200-25 MG 12hr capsule; Take 1 capsule by mouth 2 (two) times daily.  Dispense: 60 capsule; Refill: 5  2. Thrush She reports that this has been a problem for her since being on steroid inhaler.  - nystatin (MYCOSTATIN) 100000 UNIT/ML suspension; Take 5 mLs (500,000 Units total) by mouth 4 (four) times daily.  Dispense: 60 mL; Refill: 0  3. Acute CVA (cerebrovascular accident) ([place]) No residual neurologic deficits.           Lelon Huh, MD  [facility]

## 2017-01-16 NOTE — Progress Notes (Signed)
   01/06/17 1249  Acute Rehab PT Goals  Patient Stated Goal to go home  PT Goal Formulation With patient  Time For Goal Achievement 01/20/17  Potential to Achieve Goals Good  PT Time Calculation  PT Start Time (ACUTE ONLY) 1150  PT Stop Time (ACUTE ONLY) 1210  PT Time Calculation (min) (ACUTE ONLY) 20 min  PT G-Codes **NOT FOR INPATIENT CLASS**  Functional Assessment Tool Used AM-PAC 6 Clicks Basic Mobility  Functional Limitation Mobility: Walking and moving around  Mobility: Walking and Moving Around Current Status (F0104) CI  Mobility: Walking and Moving Around Goal Status (U4591) CI  Mobility: Walking and Moving Around Discharge Status (L6859) CI  PT General Charges  $$ ACUTE PT VISIT 1 Procedure  PT Evaluation  $PT Eval Low Complexity 1 Procedure   Late entry G-codes entered after review of initial documentation.  Leitha Bleak, PT 01/16/17, 4:56 PM [phone]

## 2017-01-16 NOTE — Progress Notes (Signed)
Late entry for missed G-code. Based on review of the evaluation and goals by this therapist, Edi Gorniak, MS, CCC-SLP   Nichoals Heyde, MS, CCC-SLP   

## 2017-01-17 DIAGNOSIS — Z882 Allergy status to sulfonamides status: Secondary | ICD-10-CM | POA: Diagnosis not present

## 2017-01-17 DIAGNOSIS — Z8673 Personal history of transient ischemic attack (TIA), and cerebral infarction without residual deficits: Secondary | ICD-10-CM | POA: Diagnosis not present

## 2017-01-17 DIAGNOSIS — R51 Headache: Secondary | ICD-10-CM | POA: Diagnosis not present

## 2017-01-17 DIAGNOSIS — G441 Vascular headache, not elsewhere classified: Secondary | ICD-10-CM | POA: Diagnosis not present

## 2017-01-17 DIAGNOSIS — I639 Cerebral infarction, unspecified: Secondary | ICD-10-CM | POA: Diagnosis not present

## 2017-01-27 ENCOUNTER — Ambulatory Visit (INDEPENDENT_AMBULATORY_CARE_PROVIDER_SITE_OTHER): Payer: PPO | Admitting: Family Medicine

## 2017-01-27 ENCOUNTER — Encounter: Payer: Self-pay | Admitting: Family Medicine

## 2017-01-27 VITALS — BP 128/82 | HR 80 | Temp 98.7°F | Resp 16 | Wt 161.0 lb

## 2017-01-27 DIAGNOSIS — T50905A Adverse effect of unspecified drugs, medicaments and biological substances, initial encounter: Secondary | ICD-10-CM

## 2017-01-27 DIAGNOSIS — E782 Mixed hyperlipidemia: Secondary | ICD-10-CM | POA: Diagnosis not present

## 2017-01-27 DIAGNOSIS — I679 Cerebrovascular disease, unspecified: Secondary | ICD-10-CM | POA: Diagnosis not present

## 2017-01-27 DIAGNOSIS — T887XXA Unspecified adverse effect of drug or medicament, initial encounter: Secondary | ICD-10-CM

## 2017-01-27 MED ORDER — ASPIRIN EC 81 MG PO TBEC: 81.0000 mg | DELAYED_RELEASE_TABLET | Freq: Two times a day (BID) | ORAL | 1 refills | Status: DC

## 2017-01-27 NOTE — Progress Notes (Signed)
Patient: Destiny Gilbert Female    DOB: 22-Jul-1940   76 y.o.   MRN: [id number] Visit Date: 01/27/2017  Today's Provider: Lelon Huh, MD   Chief Complaint  Patient presents with  . Follow-up   Subjective:    HPI Follow up of Cerebrovascular Disease:  Patient was last seen for this problem 12 days ago. Management during that visit includes starting Aggrenox. Patient states after taking one dose of the Aggrenox she began feeling nauseous, pale and had a headache. She took a 2nd dose while travelling in [place] and began feeling even worse. She called EMS due to headache, (which was only symptoms of previous CVA). She was evaluated at ER and had CT with no changes. Patient has stopped taking Aggrenox due to intolerance. She tried taking 325mg  ASA but started having upset stomach, so has resumed taking ECAspirin 81 mg daily. She has had no more headaches since ER visit.     Allergies  Allergen Reactions  . Sulfa Antibiotics Nausea And Vomiting     Current Outpatient Prescriptions:  .  acetaminophen (TYLENOL) 500 MG tablet, Take 500 mg by mouth every 6 (six) hours as needed., Disp: , Rfl:  .  ADVAIR DISKUS 250-50 MCG/DOSE AEPB, INHALE ONE DOSE BY MOUTH TWICE DAILY, Disp: 60 each, Rfl: 12 .  albuterol (PROAIR HFA) 108 (90 BASE) MCG/ACT inhaler, Inhale 2 puffs into the lungs every 6 (six) hours as needed., Disp: , Rfl:  .  aspirin EC 81 MG tablet, Take 81 mg by mouth daily., Disp: , Rfl:  .  atorvastatin (LIPITOR) 40 MG tablet, Take 1 tablet (40 mg total) by mouth daily at 6 PM., Disp: 30 tablet, Rfl: 1 .  glucose blood (ONE TOUCH ULTRA TEST) test strip, Use to check blood sugar daily, Disp: 100 each, Rfl: 12 .  ibuprofen (ADVIL,MOTRIN) 600 MG tablet, Take 600 mg by mouth every 6 (six) hours as needed., Disp: , Rfl:  .  MAGNESIUM PO, Take 250 mg by mouth at bedtime., Disp: , Rfl:  .  metFORMIN (GLUCOPHAGE-XR) 500 MG 24 hr tablet, TAKE ONE TABLET BY MOUTH ONCE DAILY FOR  BLOOD   SUGAR, Disp: 30 tablet, Rfl: 12 .  montelukast (SINGULAIR) 10 MG tablet, Take 10 mg by mouth at bedtime., Disp: , Rfl:  .  MULTIPLE VITAMIN PO, Take 1 tablet by mouth daily., Disp: , Rfl:  .  nitroGLYCERIN (NITROSTAT) 0.3 MG SL tablet, Place 1 tablet (0.3 mg total) under the tongue every 5 (five) minutes as needed for chest pain., Disp: 90 tablet, Rfl: 0 .  nystatin (MYCOSTATIN) 100000 UNIT/ML suspension, Take 5 mLs (500,000 Units total) by mouth 4 (four) times daily., Disp: 60 mL, Rfl: 0 .  Omega-3 Fatty Acids (FISH OIL) 1200 MG CAPS, Take 1 capsule by mouth daily. , Disp: , Rfl:  .  triamterene-hydrochlorothiazide (MAXZIDE-25) 37.5-25 MG tablet, TAKE ONE TABLET BY MOUTH ONCE DAILY, Disp: 90 tablet, Rfl: 4 .  Vitamin E 400 UNITS TABS, Take 1 tablet by mouth daily., Disp: , Rfl:  .  dipyridamole-aspirin (AGGRENOX) 200-25 MG 12hr capsule, Take 1 capsule by mouth 2 (two) times daily. (Patient not taking: Reported on 01/27/2017), Disp: 60 capsule, Rfl: 5  Review of Systems  Constitutional: Negative for appetite change, chills, fatigue and fever.  Respiratory: Negative for chest tightness and shortness of breath.   Cardiovascular: Negative for chest pain and palpitations.  Gastrointestinal: Negative for abdominal pain, nausea and vomiting.  Neurological: Positive for  headaches (resolved). Negative for dizziness, weakness and numbness.    Social History  Substance Use Topics  . Smoking status: Never Smoker  . Smokeless tobacco: Never Used  . Alcohol use 0.0 oz/week     Comment: has 3 ounces of wine each week   Objective:   BP 128/82 (BP Location: Left Arm, Patient Position: Sitting, Cuff Size: Large)   Pulse 80   Temp 98.7 F (37.1 C) (Oral)   Resp 16   Wt 161 lb (73 kg)   SpO2 95% Comment: room air  BMI 29.45 kg/m  Vitals:   01/27/17 1608  BP: 128/82  Pulse: 80  Resp: 16  Temp: 98.7 F (37.1 C)  TempSrc: Oral  SpO2: 95%  Weight: 161 lb (73 kg)     Physical Exam  General  appearance: alert, well developed, well nourished, cooperative and in no distress Head: Normocephalic, without obvious abnormality, atraumatic Respiratory: Respirations even and unlabored, normal respiratory rate Extremities: No gross deformities Skin: Skin color, texture, turgor normal. No rashes seen  Psych: Appropriate mood and affect. Neurologic: Mental status: Alert, oriented to person, place, and time, thought content appropriate.     Assessment & Plan:     1. Adverse effect of drug, initial encounter Intolerant to Aggrenox recently prescribed for cerebro vascular disease.   2. Cerebrovascular disease She is tolerating higher intensity statin. Will increase ECASA to 81mg  BID to further reduce risk of another CVA and minimize GI side effects.   3. Mixed hyperlipidemia She is tolerating atorvastatin well with no adverse effects.   - Lipid panel       Lelon Huh, MD  [facility]

## 2017-01-27 NOTE — Patient Instructions (Signed)
   Please go [facility] when you are fasting within the next week to check your cholesterol.

## 2017-02-03 DIAGNOSIS — E782 Mixed hyperlipidemia: Secondary | ICD-10-CM | POA: Diagnosis not present

## 2017-02-04 LAB — LIPID PANEL
CHOLESTEROL TOTAL: 166 mg/dL (ref 100–199)
Chol/HDL Ratio: 2.4 ratio (ref 0.0–4.4)
HDL: 68 mg/dL (ref >39)
LDL Calculated: 77 mg/dL (ref 0–99)
Triglycerides: 103 mg/dL (ref 0–149)
VLDL CHOLESTEROL CAL: 21 mg/dL (ref 5–40)

## 2017-02-12 ENCOUNTER — Ambulatory Visit: Payer: PPO | Admitting: Family Medicine

## 2017-02-23 ENCOUNTER — Other Ambulatory Visit: Payer: Self-pay | Admitting: Family Medicine

## 2017-02-23 DIAGNOSIS — I10 Essential (primary) hypertension: Secondary | ICD-10-CM | POA: Diagnosis not present

## 2017-02-23 DIAGNOSIS — Z8673 Personal history of transient ischemic attack (TIA), and cerebral infarction without residual deficits: Secondary | ICD-10-CM | POA: Diagnosis not present

## 2017-02-23 DIAGNOSIS — E7800 Pure hypercholesterolemia, unspecified: Secondary | ICD-10-CM | POA: Diagnosis not present

## 2017-02-23 DIAGNOSIS — I63532 Cerebral infarction due to unspecified occlusion or stenosis of left posterior cerebral artery: Secondary | ICD-10-CM | POA: Diagnosis not present

## 2017-02-25 DIAGNOSIS — I639 Cerebral infarction, unspecified: Secondary | ICD-10-CM | POA: Diagnosis not present

## 2017-03-02 DIAGNOSIS — Z8673 Personal history of transient ischemic attack (TIA), and cerebral infarction without residual deficits: Secondary | ICD-10-CM | POA: Diagnosis not present

## 2017-03-04 ENCOUNTER — Other Ambulatory Visit: Payer: Self-pay | Admitting: Family Medicine

## 2017-03-04 MED ORDER — ATORVASTATIN CALCIUM 40 MG PO TABS: 40.0000 mg | ORAL_TABLET | Freq: Every day | ORAL | 11 refills | Status: DC

## 2017-03-04 NOTE — Telephone Encounter (Signed)
Pt needs new refill on her  Atorvastatin 40 mg  [facility] [address]  [employer]

## 2017-03-05 DIAGNOSIS — I491 Atrial premature depolarization: Secondary | ICD-10-CM | POA: Diagnosis not present

## 2017-03-06 ENCOUNTER — Other Ambulatory Visit: Payer: Self-pay | Admitting: *Deleted

## 2017-03-06 MED ORDER — ATORVASTATIN CALCIUM 40 MG PO TABS: 40.0000 mg | ORAL_TABLET | Freq: Every day | ORAL | 11 refills | Status: DC

## 2017-03-06 NOTE — Telephone Encounter (Signed)
Resent rx for atorvastatin to [facility]. Original was faxed with no signature.

## 2017-03-16 ENCOUNTER — Other Ambulatory Visit: Payer: Self-pay | Admitting: Family Medicine

## 2017-03-16 DIAGNOSIS — E119 Type 2 diabetes mellitus without complications: Secondary | ICD-10-CM

## 2017-03-27 ENCOUNTER — Encounter: Payer: Self-pay | Admitting: Family Medicine

## 2017-03-27 ENCOUNTER — Ambulatory Visit (INDEPENDENT_AMBULATORY_CARE_PROVIDER_SITE_OTHER): Payer: PPO | Admitting: Family Medicine

## 2017-03-27 VITALS — BP 108/62 | HR 80 | Temp 98.2°F | Resp 16 | Wt 162.0 lb

## 2017-03-27 DIAGNOSIS — Z23 Encounter for immunization: Secondary | ICD-10-CM

## 2017-03-27 DIAGNOSIS — N3001 Acute cystitis with hematuria: Secondary | ICD-10-CM | POA: Diagnosis not present

## 2017-03-27 LAB — POCT URINALYSIS DIPSTICK
BILIRUBIN UA: NEGATIVE
Glucose, UA: NEGATIVE
Ketones, UA: NEGATIVE
NITRITE UA: POSITIVE
PROTEIN UA: NEGATIVE
Spec Grav, UA: <=1.005 — AB (ref 1.010–1.025)
UROBILINOGEN UA: 0.2 U/dL
pH, UA: 7 (ref 5.0–8.0)

## 2017-03-27 MED ORDER — CIPROFLOXACIN HCL 500 MG PO TABS: 500.0000 mg | ORAL_TABLET | Freq: Two times a day (BID) | ORAL | 0 refills | Status: AC

## 2017-03-27 NOTE — Progress Notes (Signed)
Patient: Destiny Gilbert Female    DOB: [birth year]   76 y.o.   MRN: [id number] Visit Date: 03/27/2017  Today's Provider: Lelon Huh, MD   Chief Complaint  Patient presents with  . Urinary Tract Infection   Subjective:    HPI Patient comes in today c/o urinary frequency and burning on urination since yesterday. She reports that she has taken AZO and it helped a little. She denies any low back pain or abdominal pain. Denies fever.     Allergies  Allergen Reactions  . Dipyridamole Nausea Only    Headache  . Sulfa Antibiotics Nausea And Vomiting     Current Outpatient Prescriptions:  .  acetaminophen (TYLENOL) 500 MG tablet, Take 500 mg by mouth every 6 (six) hours as needed., Disp: , Rfl:  .  ADVAIR DISKUS 250-50 MCG/DOSE AEPB, INHALE ONE DOSE BY MOUTH TWICE DAILY, Disp: 60 each, Rfl: 12 .  albuterol (PROAIR HFA) 108 (90 BASE) MCG/ACT inhaler, Inhale 2 puffs into the lungs every 6 (six) hours as needed., Disp: , Rfl:  .  aspirin EC 81 MG tablet, Take 81 mg by mouth daily., Disp: , Rfl:  .  atorvastatin (LIPITOR) 40 MG tablet, Take 1 tablet (40 mg total) by mouth daily at 6 PM., Disp: 30 tablet, Rfl: 11 .  glucose blood (ONE TOUCH ULTRA TEST) test strip, Use to check blood sugar daily, Disp: 100 each, Rfl: 12 .  ibuprofen (ADVIL,MOTRIN) 600 MG tablet, Take 600 mg by mouth every 6 (six) hours as needed., Disp: , Rfl:  .  MAGNESIUM PO, Take 250 mg by mouth at bedtime., Disp: , Rfl:  .  metFORMIN (GLUCOPHAGE-XR) 500 MG 24 hr tablet, TAKE ONE TABLET BY MOUTH ONCE DAILY FOR  BLOOD  SUGAR, Disp: 30 tablet, Rfl: 12 .  montelukast (SINGULAIR) 10 MG tablet, Take 10 mg by mouth at bedtime., Disp: , Rfl:  .  MULTIPLE VITAMIN PO, Take 1 tablet by mouth daily., Disp: , Rfl:  .  nitroGLYCERIN (NITROSTAT) 0.3 MG SL tablet, Place 1 tablet (0.3 mg total) under the tongue every 5 (five) minutes as needed for chest pain., Disp: 90 tablet, Rfl: 0 .  Omega-3 Fatty Acids (FISH OIL) 1200 MG CAPS,  Take 1 capsule by mouth daily. , Disp: , Rfl:  .  triamterene-hydrochlorothiazide (MAXZIDE-25) 37.5-25 MG tablet, TAKE ONE TABLET BY MOUTH ONCE DAILY, Disp: 90 tablet, Rfl: 4   Review of Systems  Constitutional: Positive for fatigue. Negative for activity change, chills and fever.  Gastrointestinal: Negative for abdominal pain.  Genitourinary: Positive for difficulty urinating, dysuria and frequency. Negative for decreased urine volume, flank pain, hematuria and pelvic pain.  Musculoskeletal: Negative for back pain.    Social History  Substance Use Topics  . Smoking status: Never Smoker  . Smokeless tobacco: Never Used  . Alcohol use 0.0 oz/week     Comment: has 3 ounces of wine each week   Objective:   BP 108/62 (BP Location: Left Arm, Patient Position: Sitting, Cuff Size: Normal)   Pulse 80   Temp 98.2 F (36.8 C)   Resp 16   Wt 162 lb (73.5 kg)   SpO2 95%   BMI 29.63 kg/m  Vitals:   03/27/17 1443  BP: 108/62  Pulse: 80  Resp: 16  Temp: 98.2 F (36.8 C)  SpO2: 95%  Weight: 162 lb (73.5 kg)     Physical Exam  General Appearance:    Alert, cooperative, no distress  Abdomen:   . No CVA tenderness    Results for orders placed or performed in visit on 03/27/17  POCT urinalysis dipstick  Result Value Ref Range   Color, UA amber    Clarity, UA cloudy    Glucose, UA negative    Bilirubin, UA negative    Ketones, UA negative    Spec Grav, UA <=1.005 (A) 1.010 - 1.025   Blood, UA non hemolyzed moderate    pH, UA 7.0 5.0 - 8.0   Protein, UA negative    Urobilinogen, UA 0.2 0.2 or 1.0 E.U./dL   Nitrite, UA positive    Leukocytes, UA Moderate (2+) (A) Negative       Assessment & Plan:     1. Acute cystitis with hematuria  - POCT urinalysis dipstick - Urine Culture - ciprofloxacin (CIPRO) 500 MG tablet; Take 1 tablet (500 mg total) by mouth 2 (two) times daily.  Dispense: 14 tablet; Refill: 0  2. Influenza vaccine needed  - Flu vaccine HIGH DOSE PF  (Fluzone High dose)       Lelon Huh, MD  [facility]

## 2017-03-28 LAB — URINE CULTURE
MICRO NUMBER:: 81047555
SPECIMEN QUALITY:: ADEQUATE

## 2017-05-27 ENCOUNTER — Ambulatory Visit (INDEPENDENT_AMBULATORY_CARE_PROVIDER_SITE_OTHER): Payer: PPO | Admitting: Family Medicine

## 2017-05-27 ENCOUNTER — Encounter: Payer: Self-pay | Admitting: Family Medicine

## 2017-05-27 ENCOUNTER — Other Ambulatory Visit: Payer: Self-pay

## 2017-05-27 VITALS — BP 130/74 | HR 73 | Temp 97.9°F | Resp 16 | Ht 62.0 in | Wt 162.0 lb

## 2017-05-27 DIAGNOSIS — E119 Type 2 diabetes mellitus without complications: Secondary | ICD-10-CM

## 2017-05-27 DIAGNOSIS — I679 Cerebrovascular disease, unspecified: Secondary | ICD-10-CM | POA: Diagnosis not present

## 2017-05-27 DIAGNOSIS — Z0000 Encounter for general adult medical examination without abnormal findings: Secondary | ICD-10-CM

## 2017-05-27 DIAGNOSIS — I10 Essential (primary) hypertension: Secondary | ICD-10-CM

## 2017-05-27 DIAGNOSIS — J309 Allergic rhinitis, unspecified: Secondary | ICD-10-CM

## 2017-05-27 LAB — POCT GLYCOSYLATED HEMOGLOBIN (HGB A1C)
ESTIMATED AVERAGE GLUCOSE: 134
HEMOGLOBIN A1C: 6.3

## 2017-05-27 NOTE — Progress Notes (Signed)
Patient: Destiny Gilbert, Female    DOB: [birth year], 76 y.o.   MRN: [id number] Visit Date: 05/27/2017  Today's Provider: Lelon Huh, MD   Chief Complaint  Patient presents with  . Medicare Wellness  . Diabetes  . Hypertension  . Hyperlipidemia   Subjective:    Annual wellness visit Destiny Gilbert is a 76 y.o. female. She feels well. She reports exercising none. She reports she is sleeping well.  -----------------------------------------------------------  Diabetes Mellitus Type II, Follow-up:   Lab Results  Component Value Date   HGBA1C 6.3 (H) 01/06/2017   HGBA1C 6.2 09/11/2016   HGBA1C 6.1 03/18/2016   Last seen for diabetes 8 months ago.  Management since then includes; no changes. She reports good compliance with treatment. She is not having side effects. none Current symptoms include none and have been unchanged. Home blood sugar records: fasting range: 114-131  Episodes of hypoglycemia? no   Current Insulin Regimen: n/a Most Recent Eye Exam: due Weight trend: stable Prior visit with dietician: no Current diet: well balanced Current exercise: none  ----------------------------------------------------------------    Hypertension, follow-up:  BP Readings from Last 3 Encounters:  05/27/17 130/74  03/27/17 108/62  01/27/17 128/82    She was last seen for hypertension 8 months ago.  BP at that visit was 124/70. Management since that visit includes; no changes.She reports good compliance with treatment. She is not having side effects. none She is not exercising. She is adherent to low salt diet.   Outside blood pressures are not checking. She is experiencing none.  Patient denies none.   Cardiovascular risk factors include diabetes mellitus.  Use of agents associated with hypertension: none.   ----------------------------------------------------------------     Lipid/Cholesterol, Follow-up:   Last seen for this 4 months ago.    Management since that visit includes; labs checked, no changes.  Last Lipid Panel:    Component Value Date/Time   CHOL 166 02/03/2017 0918   TRIG 103 02/03/2017 0918   HDL 68 02/03/2017 0918   CHOLHDL 2.4 02/03/2017 0918   CHOLHDL 3.3 01/06/2017 0448   VLDL 44 (H) 01/06/2017 0448   LDLCALC 77 02/03/2017 0918    She reports good compliance with treatment. She is not having side effects. none  Wt Readings from Last 3 Encounters:  05/27/17 162 lb (73.5 kg)  03/27/17 162 lb (73.5 kg)  01/27/17 161 lb (73 kg)    ----------------------------------------------------------------  Cerebrovascular disease From 01/27/2017- increased ECASA to 81mg  BID to further reduce risk of another CVA and minimize GI side effects.    Review of Systems  Respiratory: Positive for cough.   Musculoskeletal: Positive for arthralgias.  All other systems reviewed and are negative.   Social History   Socioeconomic History  . Marital status: Married    Spouse name: Not on file  . Number of children: 3  . Years of education: Not on file  . Highest education level: Not on file  Social Needs  . Financial resource strain: Not on file  . Food insecurity - worry: Not on file  . Food insecurity - inability: Not on file  . Transportation needs - medical: Not on file  . Transportation needs - non-medical: Not on file  Occupational History  . Occupation: Retired  Tobacco Use  . Smoking status: Never Smoker  . Smokeless tobacco: Never Used  Substance and Sexual Activity  . Alcohol use: Yes    Alcohol/week: 0.0 oz    Comment: has  3 ounces of wine each week  . Drug use: No  . Sexual activity: Not on file  Other Topics Concern  . Not on file  Social History Narrative  . Not on file    Past Medical History:  Diagnosis Date  . Diabetes mellitus without complication ([place])   . History of peptic ulcer    perforated  . Pneumonia 01/23/2015   [facility]      Patient Active Problem List   Diagnosis  Date Noted  . Cerebrovascular disease 01/15/2017  . Acute CVA (cerebrovascular accident) ([place]) 01/05/2017  . Hyperplastic colon polyp 12/25/2016  . Occult blood in stools 10/14/2016  . Diabetes ([place]) 12/16/2009  . Asthma 12/21/2008  . Allergic rhinitis 04/16/2008  . Essential (primary) hypertension 05/03/2007  . Mixed hyperlipidemia 07/07/2006  . Peptic ulcer 07/08/2003  . Colon, diverticulosis 07/07/1998    Past Surgical History:  Procedure Laterality Date  . ABDOMINAL HYSTERECTOMY  1994   BSO  . CATARACT EXTRACTION     left eye- 09/2014; righr eye- 08/13/2014  . COLONOSCOPY WITH PROPOFOL N/A 12/23/2016   Procedure: COLONOSCOPY WITH PROPOFOL;  Surgeon: Lollie Sails, MD;  Location[facility] ENDOSCOPY;  Service: Endoscopy;  Laterality: N/A;    Her family history includes Hyperlipidemia in her sister; Hypertension in her sister; Lung cancer in her mother; Pancreatic cancer in her father.      Current Outpatient Medications:  .  acetaminophen (TYLENOL) 500 MG tablet, Take 500 mg by mouth every 6 (six) hours as needed., Disp: , Rfl:  .  ADVAIR DISKUS 250-50 MCG/DOSE AEPB, INHALE ONE DOSE BY MOUTH TWICE DAILY, Disp: 60 each, Rfl: 12 .  albuterol (PROAIR HFA) 108 (90 BASE) MCG/ACT inhaler, Inhale 2 puffs into the lungs every 6 (six) hours as needed., Disp: , Rfl:  .  aspirin EC 81 MG tablet, Take 81 mg by mouth daily., Disp: , Rfl:  .  atorvastatin (LIPITOR) 40 MG tablet, Take 1 tablet (40 mg total) by mouth daily at 6 PM., Disp: 30 tablet, Rfl: 11 .  Glucosamine-Chondroit-Vit C-Mn (GLUCOSAMINE CHONDR 1500 COMPLX) CAPS, Take 1,500 capsules by mouth. Takes 3-4 capsules daily, Disp: , Rfl:  .  glucose blood (ONE TOUCH ULTRA TEST) test strip, Use to check blood sugar daily, Disp: 100 each, Rfl: 12 .  ibuprofen (ADVIL,MOTRIN) 600 MG tablet, Take 600 mg by mouth every 6 (six) hours as needed., Disp: , Rfl:  .  MAGNESIUM PO, Take 250 mg by mouth at bedtime., Disp: , Rfl:  .  metFORMIN  (GLUCOPHAGE-XR) 500 MG 24 hr tablet, TAKE ONE TABLET BY MOUTH ONCE DAILY FOR  BLOOD  SUGAR, Disp: 30 tablet, Rfl: 12 .  montelukast (SINGULAIR) 10 MG tablet, Take 10 mg by mouth at bedtime., Disp: , Rfl:  .  MULTIPLE VITAMIN PO, Take 1 tablet by mouth daily., Disp: , Rfl:  .  nitroGLYCERIN (NITROSTAT) 0.3 MG SL tablet, Place 1 tablet (0.3 mg total) under the tongue every 5 (five) minutes as needed for chest pain., Disp: 90 tablet, Rfl: 0 .  Omega-3 Fatty Acids (FISH OIL) 1200 MG CAPS, Take 1 capsule by mouth daily. , Disp: , Rfl:  .  triamterene-hydrochlorothiazide (MAXZIDE-25) 37.5-25 MG tablet, TAKE ONE TABLET BY MOUTH ONCE DAILY, Disp: 90 tablet, Rfl: 4  Patient Care Team: Birdie Sons, MD as PCP - General (Family Medicine) [place], [place] (Optometry) Lollie Sails, MD as Consulting Physician (Gastroenterology)     Objective:   Vitals: BP 130/74 (BP Location:  Left Arm, Patient Position: Sitting, Cuff Size: Normal)   Pulse 73   Temp 97.9 F (36.6 C) (Oral)   Resp 16   Ht 5\' 2"  (1.575 m)   Wt 162 lb (73.5 kg)   SpO2 97%   BMI 29.63 kg/m   Physical Exam   General Appearance:    Alert, cooperative, no distress  Eyes:    PERRL, conjunctiva/corneas clear, EOM's intact       Lungs:     Clear to auscultation bilaterally, respirations unlabored  Heart:    Regular rate and rhythm  Neurologic:   Awake, alert, oriented x 3. No apparent focal neurological           defect.         Activities of Daily Living In your present state of health, do you have any difficulty performing the following activities: 05/27/2017 01/05/2017  Hearing? N -  Vision? N -  Difficulty concentrating or making decisions? N -  Walking or climbing stairs? N -  Dressing or bathing? N -  Doing errands, shopping? N N  Preparing Food and eating ? - -  Using the Toilet? - -  In the past six months, have you accidently leaked urine? - -  Do you have problems with loss of bowel control? - -    Managing your Medications? - -  Managing your Finances? - -  Housekeeping or managing your Housekeeping? - -  Some recent data might be hidden    Fall Risk Assessment Fall Risk  05/27/2017 01/02/2017 05/20/2016 05/17/2015  Falls in the past year? No No No No     Depression Screen PHQ 2/9 Scores 05/27/2017 01/02/2017 01/02/2017 05/20/2016  PHQ - 2 Score 0 0 0 0  PHQ- 9 Score 0 0 - -    Cognitive Testing - 6-CIT  Correct? Score   What year is it? no 0 0 or 4  What month is it? no 0 0 or 3  Memorize:    Pia Mau,  44,  [address],  [place],      What time is it? (within 1 hour) yes 0 0 or 3  Count backwards from 20 yes 0 0, 2, or 4  Name the months of the year yes 0 0, 2, or 4  Repeat name & address above yes 3 0, 2, 4, 6, 8, or 10       TOTAL SCORE  3/28   Interpretation:  Normal  Normal (0-7) Abnormal (8-28)    Audit-C Alcohol Use Screening  Question Answer Points  How often do you have alcoholic drink? weekly 2  On days you do drink alcohol, how many drinks do you typically consume? 1 to 2 1  How oftey will you drink 6 or more in a total? never 3  Total Score:  3   A score of 3 or more in women, and 4 or more in men indicates increased risk for alcohol abuse, EXCEPT if all of the points are from question 1.     Assessment & Plan:     Annual Wellness Visit  Reviewed patient's Family Medical History Reviewed and updated list of patient's medical providers Assessment of cognitive impairment was done Assessed patient's functional ability Established a written schedule for health screening [place] Completed and Reviewed  Exercise Activities and Dietary recommendations Goals    None      Immunization History  Administered Date(s) Administered  . Influenza, High Dose Seasonal PF  03/18/2016, 03/27/2017  . Influenza,inj,Quad PF,6+ Mos 05/17/2015  . Pneumococcal Conjugate-13 05/20/2016  . Pneumococcal Polysaccharide-23 04/12/2008,  09/12/2013  . Tdap 05/03/2007  . Zoster 06/12/2009    Health Maintenance  Topic Date Due  . FOOT EXAM  09/23/1950  . TETANUS/TDAP  05/02/2017  . OPHTHALMOLOGY EXAM  12/05/2017 (Originally 09/23/1950)  . HEMOGLOBIN A1C  07/09/2017  . URINE MICROALBUMIN  09/11/2017  . MAMMOGRAM  08/12/2018  . DEXA SCAN  07/05/2019  . COLONOSCOPY  12/24/2019  . INFLUENZA VACCINE  Completed  . PNA vac Low Risk Adult  Completed     Discussed health benefits of physical activity, and encouraged her to engage in regular exercise appropriate for her age and condition.    Results for orders placed or performed in visit on 05/27/17  POCT glycosylated hemoglobin (Hb A1C)  Result Value Ref Range   Hemoglobin A1C 6.3    Est. average glucose Bld gHb Est-mCnc 134     ------------------------------------------------------------------------------------------------------------  1. Medicare annual wellness visit, subsequent   2. Type 2 diabetes mellitus without complication, without long-term current use of insulin (HCC) Well controlled.  Continue current medications.   - POCT glycosylated hemoglobin (Hb A1C)  3. Essential (primary) hypertension .Well controlled.  Continue current medications.    4. Cerebrovascular disease Asymptomatic. Compliant with medication.  Continue aggressive risk factor modification.    5. Allergic rhinitis, unspecified seasonality, unspecified trigger Doing well with montelukast.     Lelon Huh, MD  [facility]

## 2017-05-27 NOTE — Patient Instructions (Addendum)
   The CDC recommends two doses of Shingrix (the shingles vaccine) separated by 2 to 6 months for adults age 74 years and older. I recommend checking with your insurance plan regarding coverage for this vaccine.    Please contact your eyecare professional to schedule a routine eye exam

## 2017-06-18 ENCOUNTER — Encounter: Payer: Self-pay | Admitting: Family Medicine

## 2017-06-18 ENCOUNTER — Ambulatory Visit: Payer: PPO | Admitting: Family Medicine

## 2017-06-18 VITALS — BP 132/74 | HR 84 | Temp 98.8°F | Resp 16 | Wt 162.0 lb

## 2017-06-18 DIAGNOSIS — J069 Acute upper respiratory infection, unspecified: Secondary | ICD-10-CM | POA: Diagnosis not present

## 2017-06-18 MED ORDER — AZITHROMYCIN 250 MG PO TABS: ORAL_TABLET | ORAL | 0 refills | Status: AC

## 2017-06-18 NOTE — Progress Notes (Signed)
Patient: Destiny Gilbert Female    DOB: 02/14/1941   76 y.o.   MRN: [id number] Visit Date: 06/18/2017  Today's Provider: Lelon Huh, MD   Chief Complaint  Patient presents with  . Sore Throat  . Cough   Subjective:    Sore Throat   This is a new problem. The current episode started in the past 7 days (about 4 days). The problem has been unchanged. There has been no fever. Associated symptoms include congestion, coughing, headaches and a hoarse voice. Pertinent negatives include no abdominal pain, shortness of breath or vomiting. She has had no exposure to strep. She has tried gargles for the symptoms.  Cough  This is a new problem. The current episode started in the past 7 days. The problem has been unchanged. The cough is productive of sputum (green in color). Associated symptoms include headaches. Pertinent negatives include no chest pain, chills, fever or shortness of breath. She has tried OTC cough suppressant for the symptoms. The treatment provided mild relief. Her past medical history is significant for environmental allergies.       Allergies  Allergen Reactions  . Dipyridamole Nausea Only    Headache  . Sulfa Antibiotics Nausea And Vomiting     Current Outpatient Medications:  .  acetaminophen (TYLENOL) 500 MG tablet, Take 500 mg by mouth every 6 (six) hours as needed., Disp: , Rfl:  .  ADVAIR DISKUS 250-50 MCG/DOSE AEPB, INHALE ONE DOSE BY MOUTH TWICE DAILY, Disp: 60 each, Rfl: 12 .  albuterol (PROAIR HFA) 108 (90 BASE) MCG/ACT inhaler, Inhale 2 puffs into the lungs every 6 (six) hours as needed., Disp: , Rfl:  .  aspirin EC 81 MG tablet, Take 81 mg by mouth daily., Disp: , Rfl:  .  atorvastatin (LIPITOR) 40 MG tablet, Take 1 tablet (40 mg total) by mouth daily at 6 PM., Disp: 30 tablet, Rfl: 11 .  Glucosamine-Chondroit-Vit C-Mn (GLUCOSAMINE CHONDR 1500 COMPLX) CAPS, Take 1,500 capsules by mouth. Takes 3-4 capsules daily, Disp: , Rfl:  .  glucose blood (ONE  TOUCH ULTRA TEST) test strip, Use to check blood sugar daily, Disp: 100 each, Rfl: 12 .  ibuprofen (ADVIL,MOTRIN) 600 MG tablet, Take 600 mg by mouth every 6 (six) hours as needed., Disp: , Rfl:  .  MAGNESIUM PO, Take 250 mg by mouth at bedtime., Disp: , Rfl:  .  metFORMIN (GLUCOPHAGE-XR) 500 MG 24 hr tablet, TAKE ONE TABLET BY MOUTH ONCE DAILY FOR  BLOOD  SUGAR, Disp: 30 tablet, Rfl: 12 .  montelukast (SINGULAIR) 10 MG tablet, Take 10 mg by mouth at bedtime., Disp: , Rfl:  .  MULTIPLE VITAMIN PO, Take 1 tablet by mouth daily., Disp: , Rfl:  .  nitroGLYCERIN (NITROSTAT) 0.3 MG SL tablet, Place 1 tablet (0.3 mg total) under the tongue every 5 (five) minutes as needed for chest pain., Disp: 90 tablet, Rfl: 0 .  Omega-3 Fatty Acids (FISH OIL) 1200 MG CAPS, Take 1 capsule by mouth daily. , Disp: , Rfl:  .  triamterene-hydrochlorothiazide (MAXZIDE-25) 37.5-25 MG tablet, TAKE ONE TABLET BY MOUTH ONCE DAILY, Disp: 90 tablet, Rfl: 4  Review of Systems  Constitutional: Negative for appetite change, chills, fatigue and fever.  HENT: Positive for congestion and hoarse voice.   Respiratory: Positive for cough. Negative for chest tightness and shortness of breath.   Cardiovascular: Negative for chest pain and palpitations.  Gastrointestinal: Negative for abdominal pain, nausea and vomiting.  Allergic/Immunologic: Positive for  environmental allergies.  Neurological: Positive for headaches. Negative for dizziness and weakness.    Social History   Tobacco Use  . Smoking status: Never Smoker  . Smokeless tobacco: Never Used  Substance Use Topics  . Alcohol use: Yes    Alcohol/week: 0.0 oz    Comment: has 3 ounces of wine each week   Objective:   BP 132/74 (BP Location: Left Arm, Patient Position: Sitting, Cuff Size: Normal)   Pulse 84   Temp 98.8 F (37.1 C)   Resp 16   Wt 162 lb (73.5 kg)   SpO2 96%   BMI 29.63 kg/m  Vitals:   06/18/17 0854  BP: 132/74  Pulse: 84  Resp: 16  Temp: 98.8 F  (37.1 C)  SpO2: 96%  Weight: 162 lb (73.5 kg)     Physical Exam  General Appearance:    Alert, cooperative, no distress  HENT:   bilateral TM normal without fluid or infection, neck without nodes, pharynx erythematous without exudate, sinuses nontender, post nasal drip noted and nasal mucosa pale and congested  Eyes:    PERRL, conjunctiva/corneas clear, EOM's intact       Lungs:     Clear to auscultation bilaterally, respirations unlabored  Heart:    Regular rate and rhythm  Neurologic:   Awake, alert, oriented x 3. No apparent focal neurological           defect.           Assessment & Plan:     1. Upper respiratory tract infection, unspecified type .She is leaving town for [employer] and concerned she will get worse while traveling.   Counseled regarding signs and symptoms of viral and bacterial respiratory infections. Advised her that she can start prescription for antibiotic if she develops any sign of bacterial infection, or if current symptoms last longer than 10 days.   - azithromycin (ZITHROMAX) 250 MG tablet; 2 by mouth today, then 1 daily for 4 days  Dispense: 6 tablet; Refill: 0       Lelon Huh, MD  [facility]

## 2017-06-18 NOTE — Patient Instructions (Signed)
Your cold symptoms should start to resolve in 2-3 more days. If you develop any fever, shortness or breath, wheezing, or sinus pain, then start antibiotic prescription that was written today.    Upper Respiratory Infection, Adult Most upper respiratory infections (URIs) are caused by a virus. A URI affects the nose, throat, and upper air passages. The most common type of URI is often called "the common cold." Follow these instructions at home:  Take medicines only as told by your doctor.  Gargle warm saltwater or take cough drops to comfort your throat as told by your doctor.  Use a warm mist humidifier or inhale steam from a shower to increase air moisture. This may make it easier to breathe.  Drink enough fluid to keep your pee (urine) clear or pale yellow.  Eat soups and other clear broths.  Have a healthy diet.  Rest as needed.  Go back to work when your fever is gone or your doctor says it is okay. ? You may need to stay home longer to avoid giving your URI to others. ? You can also wear a face mask and wash your hands often to prevent spread of the virus.  Use your inhaler more if you have asthma.  Do not use any tobacco products, including cigarettes, chewing tobacco, or electronic cigarettes. If you need help quitting, ask your doctor. Contact a doctor if:  You are getting worse, not better.  Your symptoms are not helped by medicine.  You have chills.  You are getting more short of breath.  You have brown or red mucus.  You have yellow or brown discharge from your nose.  You have pain in your face, especially when you bend forward.  You have a fever.  You have puffy (swollen) neck glands.  You have pain while swallowing.  You have white areas in the back of your throat. Get help right away if:  You have very bad or constant: ? Headache. ? Ear pain. ? Pain in your forehead, behind your eyes, and over your cheekbones (sinus pain). ? Chest pain.  You  have long-lasting (chronic) lung disease and any of the following: ? Wheezing. ? Long-lasting cough. ? Coughing up blood. ? A change in your usual mucus.  You have a stiff neck.  You have changes in your: ? Vision. ? Hearing. ? Thinking. ? Mood. This information is not intended to replace advice given to you by your health care provider. Make sure you discuss any questions you have with your health care provider. Document Released: 12/10/2007 Document Revised: 02/24/2016 Document Reviewed: 09/28/2013 Elsevier Interactive Patient Education  2018 [employer].

## 2017-06-24 DIAGNOSIS — I10 Essential (primary) hypertension: Secondary | ICD-10-CM | POA: Diagnosis not present

## 2017-06-24 DIAGNOSIS — E7800 Pure hypercholesterolemia, unspecified: Secondary | ICD-10-CM | POA: Diagnosis not present

## 2017-06-24 DIAGNOSIS — I63532 Cerebral infarction due to unspecified occlusion or stenosis of left posterior cerebral artery: Secondary | ICD-10-CM | POA: Diagnosis not present

## 2017-07-08 ENCOUNTER — Other Ambulatory Visit: Payer: Self-pay | Admitting: Family Medicine

## 2017-07-08 DIAGNOSIS — Z1231 Encounter for screening mammogram for malignant neoplasm of breast: Secondary | ICD-10-CM

## 2017-07-24 DIAGNOSIS — I63532 Cerebral infarction due to unspecified occlusion or stenosis of left posterior cerebral artery: Secondary | ICD-10-CM | POA: Diagnosis not present

## 2017-07-24 DIAGNOSIS — I10 Essential (primary) hypertension: Secondary | ICD-10-CM | POA: Diagnosis not present

## 2017-07-24 DIAGNOSIS — R079 Chest pain, unspecified: Secondary | ICD-10-CM | POA: Diagnosis not present

## 2017-07-24 DIAGNOSIS — I639 Cerebral infarction, unspecified: Secondary | ICD-10-CM | POA: Diagnosis not present

## 2017-07-24 DIAGNOSIS — E7800 Pure hypercholesterolemia, unspecified: Secondary | ICD-10-CM | POA: Diagnosis not present

## 2017-07-29 ENCOUNTER — Emergency Department
Admission: EM | Admit: 2017-07-29 | Discharge: 2017-07-29 | Disposition: A | Discharge status: Home / Self Care | Payer: PPO | Source: Home / Self Care | Attending: Emergency Medicine | Admitting: Emergency Medicine

## 2017-07-29 ENCOUNTER — Other Ambulatory Visit: Payer: Self-pay

## 2017-07-29 ENCOUNTER — Encounter
Admission: RE | Disposition: A | Discharge status: Home / Self Care | Payer: Self-pay | Source: Ambulatory Visit | Attending: Cardiology

## 2017-07-29 ENCOUNTER — Encounter: Payer: Self-pay | Admitting: Emergency Medicine

## 2017-07-29 ENCOUNTER — Ambulatory Visit
Admission: RE | Admit: 2017-07-29 | Discharge: 2017-07-29 | Disposition: A | Discharge status: Home / Self Care | Payer: PPO | Source: Ambulatory Visit | Attending: Cardiology | Admitting: Cardiology

## 2017-07-29 DIAGNOSIS — E119 Type 2 diabetes mellitus without complications: Secondary | ICD-10-CM

## 2017-07-29 DIAGNOSIS — Z79899 Other long term (current) drug therapy: Secondary | ICD-10-CM | POA: Insufficient documentation

## 2017-07-29 DIAGNOSIS — Z7982 Long term (current) use of aspirin: Secondary | ICD-10-CM | POA: Insufficient documentation

## 2017-07-29 DIAGNOSIS — X58XXXA Exposure to other specified factors, initial encounter: Secondary | ICD-10-CM

## 2017-07-29 DIAGNOSIS — Y929 Unspecified place or not applicable: Secondary | ICD-10-CM

## 2017-07-29 DIAGNOSIS — Z7984 Long term (current) use of oral hypoglycemic drugs: Secondary | ICD-10-CM | POA: Insufficient documentation

## 2017-07-29 DIAGNOSIS — I6389 Other cerebral infarction: Secondary | ICD-10-CM | POA: Diagnosis not present

## 2017-07-29 DIAGNOSIS — J45909 Unspecified asthma, uncomplicated: Secondary | ICD-10-CM | POA: Insufficient documentation

## 2017-07-29 DIAGNOSIS — L7632 Postprocedural hematoma of skin and subcutaneous tissue following other procedure: Secondary | ICD-10-CM | POA: Insufficient documentation

## 2017-07-29 DIAGNOSIS — Z8673 Personal history of transient ischemic attack (TIA), and cerebral infarction without residual deficits: Secondary | ICD-10-CM | POA: Insufficient documentation

## 2017-07-29 DIAGNOSIS — I10 Essential (primary) hypertension: Secondary | ICD-10-CM | POA: Insufficient documentation

## 2017-07-29 DIAGNOSIS — S21112A Laceration without foreign body of left front wall of thorax without penetration into thoracic cavity, initial encounter: Secondary | ICD-10-CM

## 2017-07-29 DIAGNOSIS — Z7902 Long term (current) use of antithrombotics/antiplatelets: Secondary | ICD-10-CM

## 2017-07-29 DIAGNOSIS — E785 Hyperlipidemia, unspecified: Secondary | ICD-10-CM | POA: Insufficient documentation

## 2017-07-29 DIAGNOSIS — Y998 Other external cause status: Secondary | ICD-10-CM | POA: Insufficient documentation

## 2017-07-29 DIAGNOSIS — I63532 Cerebral infarction due to unspecified occlusion or stenosis of left posterior cerebral artery: Secondary | ICD-10-CM | POA: Insufficient documentation

## 2017-07-29 DIAGNOSIS — R58 Hemorrhage, not elsewhere classified: Secondary | ICD-10-CM

## 2017-07-29 DIAGNOSIS — I639 Cerebral infarction, unspecified: Secondary | ICD-10-CM

## 2017-07-29 DIAGNOSIS — Y9389 Activity, other specified: Secondary | ICD-10-CM

## 2017-07-29 DIAGNOSIS — T82838A Hemorrhage of vascular prosthetic devices, implants and grafts, initial encounter: Secondary | ICD-10-CM | POA: Diagnosis not present

## 2017-07-29 DIAGNOSIS — I219 Acute myocardial infarction, unspecified: Secondary | ICD-10-CM | POA: Diagnosis not present

## 2017-07-29 HISTORY — PX: LOOP RECORDER INSERTION: EP1214

## 2017-07-29 SURGERY — LOOP RECORDER INSERTION
Anesthesia: LOCAL

## 2017-07-29 MED ORDER — LIDOCAINE-EPINEPHRINE (PF) 1 %-1:200000 IJ SOLN
INTRAMUSCULAR | Status: DC | PRN
  Administered 2017-07-29: 10 mL via INTRADERMAL

## 2017-07-29 MED ORDER — LIDOCAINE HCL (PF) 1 % IJ SOLN
INTRAMUSCULAR | Status: AC
  Filled 2017-07-29: qty 5

## 2017-07-29 MED ORDER — LIDOCAINE-EPINEPHRINE (PF) 1 %-1:200000 IJ SOLN
INTRAMUSCULAR | Status: AC
  Filled 2017-07-29: qty 30

## 2017-07-29 SURGICAL SUPPLY — 2 items
LOOP REVEAL LINQSYS (Prosthesis & Implant Heart) ×2 IMPLANT
PACK LOOP INSERTION (CUSTOM PROCEDURE TRAY) ×2 IMPLANT

## 2017-07-29 NOTE — ED Notes (Signed)
All bleeding at site under control.  No new blood present since MD sutured.  Patient tolerated this well at this time and in NAD.

## 2017-07-29 NOTE — ED Notes (Signed)
ED Provider at bedside. 

## 2017-07-29 NOTE — ED Provider Notes (Signed)
[facility] Emergency Department Provider Note  Time seen: 6:03 PM  I have reviewed the triage vital signs and the nursing notes.   HISTORY  Chief Complaint Post-op Problem    HPI Destiny Gilbert is a 77 y.o. female with a past medical history of diabetes, CVA, hypertension, presents to the emergency department for bleeding.  According to the patient she was seen around 11:00 this morning a cardiology for an implanted loop recorder.  Patient states this evening while attempting to get something to eat she noted that her blouse was bloody, saw that her bandage had soaked through with blood.  Replace the bandage was also soaked through with blood so they went to the walk-in clinic for evaluation and was sent to the emergency department for evaluation.  Patient denies any symptoms denies lightheadedness or dizziness.  Has no other complaints with a negative review of systems.  Patient has a approximately 1 cm laceration however it is bleeding with a moderate use.  No pulsatile bleeding.  Patient takes aspirin and Plavix, however states she has not taken them for the past 2 days as instructed by her cardiologist.   Past Medical History:  Diagnosis Date  . Diabetes mellitus without complication ([place])   . History of peptic ulcer    perforated  . Pneumonia 01/23/2015   [facility]     Patient Active Problem List   Diagnosis Date Noted  . Cerebrovascular disease 01/15/2017  . Acute CVA (cerebrovascular accident) ([place]) 01/05/2017  . Hyperplastic colon polyp 12/25/2016  . Occult blood in stools 10/14/2016  . Diabetes ([place]) 12/16/2009  . Asthma 12/21/2008  . Allergic rhinitis 04/16/2008  . Essential (primary) hypertension 05/03/2007  . Mixed hyperlipidemia 07/07/2006  . Peptic ulcer 07/08/2003  . Colon, diverticulosis 07/07/1998    Past Surgical History:  Procedure Laterality Date  . ABDOMINAL HYSTERECTOMY  1994   BSO  . CATARACT EXTRACTION     left eye- 09/2014;  righr eye- 08/13/2014  . COLONOSCOPY WITH PROPOFOL N/A 12/23/2016   Procedure: COLONOSCOPY WITH PROPOFOL;  Surgeon: Lollie Sails, MD;  Location: [facility] ENDOSCOPY;  Service: Endoscopy;  Laterality: N/A;  . LOOP RECORDER INSERTION N/A 07/29/2017   Procedure: LOOP RECORDER INSERTION;  Surgeon: Isaias Cowman, MD;  Location: [place] CV LAB;  Service: Cardiovascular;  Laterality: N/A;    Prior to Admission medications   Medication Sig Start Date End Date Taking? Authorizing Provider  acetaminophen (TYLENOL) 500 MG tablet Take 500 mg by mouth every 6 (six) hours as needed.    [provider]  ADVAIR DISKUS 250-50 MCG/DOSE AEPB INHALE ONE DOSE BY MOUTH TWICE DAILY 08/23/16   Birdie Sons, MD  albuterol (PROAIR HFA) 108 (90 BASE) MCG/ACT inhaler Inhale 2 puffs into the lungs every 6 (six) hours as needed. 10/01/12   [provider]  aspirin EC 81 MG tablet Take 81 mg by mouth daily.    [provider]  atorvastatin (LIPITOR) 40 MG tablet Take 1 tablet (40 mg total) by mouth daily at 6 PM. 03/06/17   Fisher, Kirstie Peri, MD  clopidogrel (PLAVIX) 75 MG tablet Take 75 mg by mouth daily.    [provider]  Glucosamine-Chondroit-Vit C-Mn (GLUCOSAMINE CHONDR 1500 COMPLX) CAPS Take 3-4 capsules by mouth daily. Takes 3-4 capsules daily     [provider]  glucose blood (ONE TOUCH ULTRA TEST) test strip Use to check blood sugar daily 12/28/15   Birdie Sons, MD  MAGNESIUM PO Take 250  mg by mouth at bedtime.    [provider]  metFORMIN (GLUCOPHAGE-XR) 500 MG 24 hr tablet TAKE ONE TABLET BY MOUTH ONCE DAILY FOR  BLOOD  SUGAR 03/16/17   Birdie Sons, MD  montelukast (SINGULAIR) 10 MG tablet Take 10 mg by mouth at bedtime.    [provider]  MULTIPLE VITAMIN PO Take 1 tablet by mouth daily. 04/12/08   [provider]  nitroGLYCERIN (NITROSTAT) 0.3 MG SL tablet Place 1 tablet (0.3 mg total) under the tongue every 5 (five)  minutes as needed for chest pain. 01/06/17   Henreitta Leber, MD  Omega-3 Fatty Acids (FISH OIL) 1200 MG CAPS Take 1 capsule by mouth daily.     [provider]  triamterene-hydrochlorothiazide (MAXZIDE-25) 37.5-25 MG tablet TAKE ONE TABLET BY MOUTH ONCE DAILY 02/23/17   Birdie Sons, MD    Allergies  Allergen Reactions  . Dipyridamole Nausea Only    Headache  . Sulfa Antibiotics Nausea And Vomiting    Family History  Problem Relation Age of Onset  . Lung cancer Mother   . Pancreatic cancer Father   . Hypertension Sister   . Hyperlipidemia Sister   . Breast cancer Neg Hx     Social History Social History   Tobacco Use  . Smoking status: Never Smoker  . Smokeless tobacco: Never Used  Substance Use Topics  . Alcohol use: Yes    Alcohol/week: 0.0 oz    Comment: has 3 ounces of wine each week  . Drug use: No    Review of Systems Constitutional: Negative for fever. Eyes: Negative for visual complaints ENT: Negative for recent illness Cardiovascular: Negative for chest pain. Respiratory: Negative for shortness of breath. Gastrointestinal: Negative for abdominal pain Genitourinary: Negative for urinary compaints Musculoskeletal: Bleeding laceration to left chest. Skin: 1 cm laceration/incision to the left chest with moderate oozing of blood. Neurological: Negative for headache All other ROS negative  ____________________________________________   PHYSICAL EXAM:  VITAL SIGNS: ED Triage Vitals  Enc Vitals Group     BP 07/29/17 1658 (!) 126/52     Pulse Rate 07/29/17 1658 88     Resp 07/29/17 1658 20     Temp 07/29/17 1658 99.5 F (37.5 C)     Temp Source 07/29/17 1658 Oral     SpO2 07/29/17 1658 97 %     Weight 07/29/17 1656 162 lb (73.5 kg)     Height 07/29/17 1656 5\' 2"  (1.575 m)     Head Circumference --      Peak Flow --      Pain Score 07/29/17 1708 2     Pain Loc --      Pain Edu? --      Excl. in [place]? --    Constitutional: Alert and  oriented. Well appearing and in no distress. Eyes: Normal appearance ENT   Head: Normocephalic and atraumatic.   Mouth/Throat: Mucous membranes are moist. Cardiovascular: Normal rate, regular rhythm.  Respiratory: Normal respiratory effort without tachypnea nor retractions. Breath sounds are clear Gastrointestinal: Soft and nontender. No distention. Musculoskeletal: Nontender with normal range of motion in all extremities. Neurologic:  Normal speech and language. No gross focal neurologic deficits  Skin: 1 cm laceration to left chest, moderate venous oozing, no pulsatile bleeding. Psychiatric: Mood and affect are normal.  ____________________________________________   INITIAL IMPRESSION / ASSESSMENT AND PLAN / ED COURSE  Pertinent labs & imaging results that were available during my care of the patient were  reviewed by me and considered in my medical decision making (see chart for details).  Patient presents to the emergency department with a bleeding incision approximately 7 hours after incision was made.  Currently with a mild to moderate use of blood from the incision.  Attempted to place a dressing but he continued to bleed.  Injected with lidocaine and placed 2 sutures however continued to bleed.  Remove the sutures, placed a layer of Surgicel in the wound and reapplied to sutures.  Very very minimal bleeding after the sutures were placed.  We will apply dressing and reevaluate in 20 minutes.  Patient tolerated procedure extremely well, has no complaints at this time.  Calm and cooperative, no distress.  LACERATION REPAIR Performed by: Harvest Dark Authorized by: Harvest Dark Consent: Verbal consent obtained. Risks and benefits: risks, benefits and alternatives were discussed Consent given by: patient Patient identity confirmed: provided demographic data Prepped and Draped in normal sterile fashion Wound explored  Laceration Location: left chest  Laceration  Length: 1cm  No Foreign Bodies seen or palpated  Anesthesia: local infiltration  Local anesthetic: lidocaine 1% wo epinephrine  Anesthetic total: 3 ml  Irrigation method: syringe Amount of cleaning: standard  Skin closure: 4-0 prolene  Number of sutures: 2  Technique: simple interrupted  Patient tolerance: Patient tolerated the procedure well with no immediate complications.    She remains hemostatic.  We will discharge with routine follow-up.  ____________________________________________   FINAL CLINICAL IMPRESSION(S) / ED DIAGNOSES  post operative bleeding    Harvest Dark, MD 07/29/17 1927

## 2017-07-29 NOTE — ED Notes (Signed)
Patients bleeding is under control at this time.  Patient has been applying pressure over the surgical incision site.  Patient states that the [facility] removed a large clot from the area.  New gauze was placed over the are.  Patient states the internal cardiac monitor is a LINQ monitor through [employer].

## 2017-07-29 NOTE — ED Triage Notes (Signed)
Pt sent from [facility] , states she had an internal cardiar recorder/monitor placed in the left chest by Dr. Saralyn Pilar this morning and was discharged around 10am states about an hour ago noticed the bandage had soaked through and soaked through shirt and coat. States bleeding is controlled at present.

## 2017-08-05 DIAGNOSIS — I63532 Cerebral infarction due to unspecified occlusion or stenosis of left posterior cerebral artery: Secondary | ICD-10-CM | POA: Diagnosis not present

## 2017-08-05 DIAGNOSIS — Z95818 Presence of other cardiac implants and grafts: Secondary | ICD-10-CM | POA: Diagnosis not present

## 2017-08-05 DIAGNOSIS — I10 Essential (primary) hypertension: Secondary | ICD-10-CM | POA: Diagnosis not present

## 2017-08-05 DIAGNOSIS — E7800 Pure hypercholesterolemia, unspecified: Secondary | ICD-10-CM | POA: Diagnosis not present

## 2017-08-06 ENCOUNTER — Ambulatory Visit (INDEPENDENT_AMBULATORY_CARE_PROVIDER_SITE_OTHER): Payer: PPO | Admitting: Family Medicine

## 2017-08-06 ENCOUNTER — Encounter: Payer: Self-pay | Admitting: Family Medicine

## 2017-08-06 VITALS — BP 110/70 | HR 79 | Temp 98.3°F | Resp 16 | Wt 165.0 lb

## 2017-08-06 DIAGNOSIS — R490 Dysphonia: Secondary | ICD-10-CM

## 2017-08-06 NOTE — Patient Instructions (Addendum)
   Try OTC fexofenadine (Allegra) once a day to help reduce drainage   Run humidifier especially at overnight    Hoarseness Hoarseness is any abnormal change in your voice.Hoarseness can make it difficult to speak. Your voice may sound raspy, breathy, or strained. Hoarseness is caused by a problem with the vocal cords. The vocal cords are two bands of tissue inside your voice box (larynx). When you speak, your vocal cords move back and forth to create sound. The surfaces of your vocal cords need to be smooth for your voice to sound clear. Swelling or lumps on the vocal cords can cause hoarseness. Common causes of vocal cord problems include:  Upper airway infection.  A long-term cough.  Straining or overusing your voice.  Smoking.  Allergies.  Vocal cord growths.  Stomach acids that flow up from your stomach and irritate your vocal cords (gastroesophageal reflux).  Follow these instructions at home: Watch your condition for any changes. To ease any discomfort that you feel:  Rest your voice. Do not whisper. Whispering can cause muscle strain.  Do not speak in a loud or harsh voice that makes your hoarseness worse.  Do not use any tobacco products, including cigarettes, chewing tobacco, or electronic cigarettes. If you need help quitting, ask your health care provider.  Avoid secondhand smoke.  Do not eat foods that give you heartburn. Heartburn can make gastroesophageal reflux worse.  Do not drink coffee.  Do not drink alcohol.  Drink enough fluids to keep your urine clear or pale yellow.  Use a humidifier if the air in your home is dry.  Contact a health care provider if:  You have hoarseness that lasts longer than 3 weeks.  You almost lose or completelylose your voice for longer than 3 days.  You have pain when you swallow or try to talk.  You feel a lump in your neck. Get help right away if:  You have trouble swallowing.  You feel as though you are  choking when you swallow.  You cough up blood or vomit blood.  You have trouble breathing. This information is not intended to replace advice given to you by your health care provider. Make sure you discuss any questions you have with your health care provider. Document Released: 06/06/2005 Document Revised: 11/29/2015 Document Reviewed: 06/14/2014 Elsevier Interactive Patient Education  [employer].

## 2017-08-06 NOTE — Progress Notes (Signed)
Patient: Destiny Gilbert Female    DOB: Aug 09, 1940   77 y.o.   MRN: [id number] Visit Date: 08/06/2017  Today's Provider: Lelon Huh, MD   Chief Complaint  Patient presents with  . Hoarse   Subjective:    HPI She reports that her voice has been hoarse for the last six months. She states she typically has this off and on in the winter time but has never been so persistent. She  was seen in office 06/18/2017 with sore throat, hoarseness. Patient stated her throat and hoarseness improved somewhat for a few weeks. However after Christmas her voice became hoarse again. Patient states her throat has not gotten better since. Patient states her throat feels scratchy all the time and she has some nasal congestion and clear drainage. Patient tried otc mucinex with no relief. Also had an old prescription for montelukast which she tried and states die not help at all.   She denies any heartburn or trouble swallowing. No fevers or chills.     Allergies  Allergen Reactions  . Dipyridamole Nausea Only    Headache  . Sulfa Antibiotics Nausea And Vomiting     Current Outpatient Medications:  .  acetaminophen (TYLENOL) 500 MG tablet, Take 500 mg by mouth every 6 (six) hours as needed., Disp: , Rfl:  .  ADVAIR DISKUS 250-50 MCG/DOSE AEPB, INHALE ONE DOSE BY MOUTH TWICE DAILY, Disp: 60 each, Rfl: 12 .  albuterol (PROAIR HFA) 108 (90 BASE) MCG/ACT inhaler, Inhale 2 puffs into the lungs every 6 (six) hours as needed., Disp: , Rfl:  .  aspirin EC 81 MG tablet, Take 81 mg by mouth daily., Disp: , Rfl:  .  atorvastatin (LIPITOR) 40 MG tablet, Take 1 tablet (40 mg total) by mouth daily at 6 PM., Disp: 30 tablet, Rfl: 11 .  clopidogrel (PLAVIX) 75 MG tablet, Take 75 mg by mouth daily., Disp: , Rfl:  .  Glucosamine-Chondroit-Vit C-Mn (GLUCOSAMINE CHONDR 1500 COMPLX) CAPS, Take 3-4 capsules by mouth daily. Takes 3-4 capsules daily , Disp: , Rfl:  .  glucose blood (ONE TOUCH ULTRA TEST) test strip, Use  to check blood sugar daily, Disp: 100 each, Rfl: 12 .  MAGNESIUM PO, Take 250 mg by mouth at bedtime., Disp: , Rfl:  .  metFORMIN (GLUCOPHAGE-XR) 500 MG 24 hr tablet, TAKE ONE TABLET BY MOUTH ONCE DAILY FOR  BLOOD  SUGAR, Disp: 30 tablet, Rfl: 12 .  montelukast (SINGULAIR) 10 MG tablet, Take 10 mg by mouth at bedtime., Disp: , Rfl:  .  MULTIPLE VITAMIN PO, Take 1 tablet by mouth daily., Disp: , Rfl:  .  nitroGLYCERIN (NITROSTAT) 0.3 MG SL tablet, Place 1 tablet (0.3 mg total) under the tongue every 5 (five) minutes as needed for chest pain., Disp: 90 tablet, Rfl: 0 .  Omega-3 Fatty Acids (FISH OIL) 1200 MG CAPS, Take 1 capsule by mouth daily. , Disp: , Rfl:  .  triamterene-hydrochlorothiazide (MAXZIDE-25) 37.5-25 MG tablet, TAKE ONE TABLET BY MOUTH ONCE DAILY, Disp: 90 tablet, Rfl: 4  Review of Systems  Constitutional: Negative for appetite change, chills, fatigue and fever.  Respiratory: Negative for chest tightness and shortness of breath.   Cardiovascular: Negative for chest pain and palpitations.  Gastrointestinal: Negative for abdominal pain, nausea and vomiting.  Neurological: Negative for dizziness and weakness.    Social History   Tobacco Use  . Smoking status: Never Smoker  . Smokeless tobacco: Never Used  Substance Use Topics  .  Alcohol use: Yes    Alcohol/week: 0.0 oz    Comment: has 3 ounces of wine each week   Objective:   BP 110/70 (BP Location: Right Arm, Patient Position: Sitting, Cuff Size: Normal)   Pulse 79   Temp 98.3 F (36.8 C) (Oral)   Resp 16   Wt 165 lb (74.8 kg)   SpO2 96%   BMI 30.18 kg/m  Vitals:   08/06/17 0923  BP: 110/70  Pulse: 79  Resp: 16  Temp: 98.3 F (36.8 C)  TempSrc: Oral  SpO2: 96%  Weight: 165 lb (74.8 kg)     Physical Exam  General Appearance:    Alert, cooperative, no distress  HENT:   bilateral TM normal without fluid or infection, neck without nodes, throat normal without erythema or exudate, post nasal drip noted and  nasal mucosa pale and congested  Eyes:    PERRL, conjunctiva/corneas clear, EOM's intact       Lungs:     Clear to auscultation bilaterally, respirations unlabored  Heart:    Regular rate and rhythm  Neurologic:   Awake, alert, oriented x 3. No apparent focal neurological           defect.           Assessment & Plan:     1. Hoarseness Persistent for six months by patient report. She has already scheduled ov with Dr. Virgia Land on 08-24-17. Suspect this is secondary to persistent post nasal drainage. Try OTC fexofenadine and humidify air. May also be side effect Advair. She has no reflux sx. She is to keep appt with Dr. Richardson Landry as scheduled. Unless symptoms resolve on fexofenadine.        Lelon Huh, MD  [facility]

## 2017-08-07 ENCOUNTER — Ambulatory Visit: Payer: PPO | Admitting: Family Medicine

## 2017-08-13 ENCOUNTER — Ambulatory Visit
Admission: RE | Admit: 2017-08-13 | Discharge: 2017-08-13 | Disposition: A | Discharge status: Home / Self Care | Payer: PPO | Source: Ambulatory Visit | Attending: Family Medicine | Admitting: Family Medicine

## 2017-08-13 DIAGNOSIS — Z1231 Encounter for screening mammogram for malignant neoplasm of breast: Secondary | ICD-10-CM | POA: Diagnosis not present

## 2017-08-24 DIAGNOSIS — K219 Gastro-esophageal reflux disease without esophagitis: Secondary | ICD-10-CM | POA: Diagnosis not present

## 2017-08-24 DIAGNOSIS — J301 Allergic rhinitis due to pollen: Secondary | ICD-10-CM | POA: Diagnosis not present

## 2017-08-24 DIAGNOSIS — J339 Nasal polyp, unspecified: Secondary | ICD-10-CM | POA: Diagnosis not present

## 2017-08-24 DIAGNOSIS — R490 Dysphonia: Secondary | ICD-10-CM | POA: Diagnosis not present

## 2017-08-31 DIAGNOSIS — I639 Cerebral infarction, unspecified: Secondary | ICD-10-CM | POA: Diagnosis not present

## 2017-09-28 DIAGNOSIS — J301 Allergic rhinitis due to pollen: Secondary | ICD-10-CM | POA: Diagnosis not present

## 2017-09-28 DIAGNOSIS — J339 Nasal polyp, unspecified: Secondary | ICD-10-CM | POA: Diagnosis not present

## 2017-09-28 DIAGNOSIS — R490 Dysphonia: Secondary | ICD-10-CM | POA: Diagnosis not present

## 2017-09-30 DIAGNOSIS — I63532 Cerebral infarction due to unspecified occlusion or stenosis of left posterior cerebral artery: Secondary | ICD-10-CM | POA: Diagnosis not present

## 2017-10-01 ENCOUNTER — Telehealth: Payer: Self-pay | Admitting: Family Medicine

## 2017-10-01 MED ORDER — HYDROCORTISONE 2.5 % RE CREA: 1.0000 "application " | TOPICAL_CREAM | Freq: Two times a day (BID) | RECTAL | 0 refills | Status: DC

## 2017-10-01 NOTE — Telephone Encounter (Signed)
Patient called requesting proctol hemorrhoid cream 2.5% to be called into [facility] She reports that she has a hemorrhiod that is painful. Thanks!

## 2017-10-16 ENCOUNTER — Other Ambulatory Visit: Payer: Self-pay | Admitting: Family Medicine

## 2017-11-02 DIAGNOSIS — K219 Gastro-esophageal reflux disease without esophagitis: Secondary | ICD-10-CM | POA: Diagnosis not present

## 2017-11-02 DIAGNOSIS — J019 Acute sinusitis, unspecified: Secondary | ICD-10-CM | POA: Diagnosis not present

## 2017-11-02 DIAGNOSIS — J301 Allergic rhinitis due to pollen: Secondary | ICD-10-CM | POA: Diagnosis not present

## 2017-11-02 DIAGNOSIS — J339 Nasal polyp, unspecified: Secondary | ICD-10-CM | POA: Diagnosis not present

## 2017-11-02 DIAGNOSIS — R1310 Dysphagia, unspecified: Secondary | ICD-10-CM | POA: Diagnosis not present

## 2017-11-09 DIAGNOSIS — Z95818 Presence of other cardiac implants and grafts: Secondary | ICD-10-CM | POA: Diagnosis not present

## 2017-11-09 DIAGNOSIS — I10 Essential (primary) hypertension: Secondary | ICD-10-CM | POA: Diagnosis not present

## 2017-11-09 DIAGNOSIS — I639 Cerebral infarction, unspecified: Secondary | ICD-10-CM | POA: Diagnosis not present

## 2017-11-09 DIAGNOSIS — R079 Chest pain, unspecified: Secondary | ICD-10-CM | POA: Diagnosis not present

## 2017-11-09 DIAGNOSIS — I63532 Cerebral infarction due to unspecified occlusion or stenosis of left posterior cerebral artery: Secondary | ICD-10-CM | POA: Diagnosis not present

## 2017-11-09 DIAGNOSIS — E7800 Pure hypercholesterolemia, unspecified: Secondary | ICD-10-CM | POA: Diagnosis not present

## 2017-11-23 NOTE — Progress Notes (Signed)
Patient: Destiny Gilbert Female    DOB: [birth year]   77 y.o.   MRN: [id number] Visit Date: 11/24/2017  Today's Provider: Lelon Huh, MD   Chief Complaint  Patient presents with  . Follow-up  . Diabetes  . Hypertension   Subjective:    HPI   Diabetes Mellitus Type II, Follow-up:   Lab Results  Component Value Date   HGBA1C 6.4 (A) 11/24/2017   HGBA1C 6.3 05/27/2017   HGBA1C 6.3 (H) 01/06/2017   Last seen for diabetes 6 months ago.  Management since then includes; no changes. She reports good compliance with treatment. She is not having side effects. none Current symptoms include none and have been unchanged. Home blood sugar records: fasting range: 120-131  Episodes of hypoglycemia? no   Current Insulin Regimen: n/a Most Recent Eye Exam: n/a Weight trend: stable Prior visit with dietician: no Current diet: well balanced Current exercise: none  ----------------------------------------------------------------   Hypertension, follow-up:  BP Readings from Last 3 Encounters:  11/24/17 104/62  08/06/17 110/70  07/29/17 (!) 154/74    She was last seen for hypertension 6 months ago.  BP at that visit was 130/74. Management since that visit includes; no changes.She reports good compliance with treatment. She is not having side effects. none She is not exercising. She is adherent to low salt diet.   Outside blood pressures are not checking. She is experiencing none.  Patient denies none.   Cardiovascular risk factors include diabetes mellitus.  Use of agents associated with hypertension: none.   ----------------------------------------------------------------     Allergies  Allergen Reactions  . Dipyridamole Nausea Only    Headache  . Sulfa Antibiotics Nausea And Vomiting     Current Outpatient Medications:  .  acetaminophen (TYLENOL) 500 MG tablet, Take 500 mg by mouth every 6 (six) hours as needed., Disp: , Rfl:  .  albuterol (PROAIR  HFA) 108 (90 BASE) MCG/ACT inhaler, Inhale 2 puffs into the lungs every 6 (six) hours as needed., Disp: , Rfl:  .  aspirin EC 81 MG tablet, Take 81 mg by mouth daily., Disp: , Rfl:  .  atorvastatin (LIPITOR) 40 MG tablet, Take 1 tablet (40 mg total) by mouth daily at 6 PM., Disp: 30 tablet, Rfl: 11 .  azelastine (ASTELIN) 0.1 % nasal spray, Place 0.3 mLs into both nostrils daily., Disp: , Rfl: 12 .  clopidogrel (PLAVIX) 75 MG tablet, Take 75 mg by mouth daily., Disp: , Rfl:  .  Fluticasone-Salmeterol (ADVAIR) 250-50 MCG/DOSE AEPB, INHALE 1 DOSE BY MOUTH TWICE DAILY, Disp: 60 each, Rfl: 12 .  Glucosamine-Chondroit-Vit C-Mn (GLUCOSAMINE CHONDR 1500 COMPLX) CAPS, Take 3-4 capsules by mouth daily. Takes 3-4 capsules daily , Disp: , Rfl:  .  glucose blood (ONE TOUCH ULTRA TEST) test strip, Use to check blood sugar daily, Disp: 100 each, Rfl: 12 .  hydrocortisone (PROCTOCARE-HC) 2.5 % rectal cream, Place 1 application rectally 2 (two) times daily., Disp: 30 g, Rfl: 0 .  metFORMIN (GLUCOPHAGE-XR) 500 MG 24 hr tablet, TAKE ONE TABLET BY MOUTH ONCE DAILY FOR  BLOOD  SUGAR, Disp: 30 tablet, Rfl: 12 .  nitroGLYCERIN (NITROSTAT) 0.3 MG SL tablet, Place 1 tablet (0.3 mg total) under the tongue every 5 (five) minutes as needed for chest pain., Disp: 90 tablet, Rfl: 0 .  Triamcinolone Acetonide (NASACORT AQ NA), Place into the nose., Disp: , Rfl:  .  triamterene-hydrochlorothiazide (WLNLGXQ-11) 37.5-25 MG tablet, TAKE ONE TABLET BY MOUTH ONCE DAILY, Disp:  90 tablet, Rfl: 4 .  MAGNESIUM PO, Take 250 mg by mouth at bedtime., Disp: , Rfl:  .  MULTIPLE VITAMIN PO, Take 1 tablet by mouth daily., Disp: , Rfl:  .  Omega-3 Fatty Acids (FISH OIL) 1200 MG CAPS, Take 1 capsule by mouth daily. , Disp: , Rfl:   Review of Systems  Constitutional: Negative for appetite change, chills, fatigue and fever.  Respiratory: Negative for chest tightness and shortness of breath.   Cardiovascular: Negative for chest pain and  palpitations.  Gastrointestinal: Negative for abdominal pain, nausea and vomiting.  Neurological: Negative for dizziness and weakness.    Social History   Tobacco Use  . Smoking status: Never Smoker  . Smokeless tobacco: Never Used  Substance Use Topics  . Alcohol use: Yes    Alcohol/week: 0.0 oz    Comment: has 3 ounces of wine each week   Objective:   BP 104/62 (BP Location: Right Arm, Patient Position: Sitting, Cuff Size: Large)   Pulse 67   Temp 97.9 F (36.6 C) (Oral)   Resp 16   Wt 165 lb (74.8 kg)   SpO2 98%   BMI 30.18 kg/m  Vitals:   11/24/17 0843  BP: 104/62  Pulse: 67  Resp: 16  Temp: 97.9 F (36.6 C)  TempSrc: Oral  SpO2: 98%  Weight: 165 lb (74.8 kg)     Physical Exam   General Appearance:    Alert, cooperative, no distress  Eyes:    PERRL, conjunctiva/corneas clear, EOM's intact       Lungs:     Clear to auscultation bilaterally, respirations unlabored  Heart:    Regular rate and rhythm  Neurologic:   Awake, alert, oriented x 3. No apparent focal neurological           defect.       Results for orders placed or performed in visit on 11/24/17  POCT glycosylated hemoglobin (Hb A1C)  Result Value Ref Range   Hemoglobin A1C 6.4 (A) 4.0 - 5.6 %       Assessment & Plan:     1. Type 2 diabetes mellitus without complication, without long-term current use of insulin (HCC) Well controlled.  Continue current medications.   - POCT glycosylated hemoglobin (Hb A1C)  2. Essential (primary) hypertension Well controlled.  Continue current medications.    Return in about 4 months (around 03/27/2018).        Lelon Huh, MD  [facility]

## 2017-11-24 ENCOUNTER — Encounter: Payer: Self-pay | Admitting: Family Medicine

## 2017-11-24 ENCOUNTER — Ambulatory Visit (INDEPENDENT_AMBULATORY_CARE_PROVIDER_SITE_OTHER): Payer: PPO | Admitting: Family Medicine

## 2017-11-24 VITALS — BP 104/62 | HR 67 | Temp 97.9°F | Resp 16 | Wt 165.0 lb

## 2017-11-24 DIAGNOSIS — I10 Essential (primary) hypertension: Secondary | ICD-10-CM | POA: Diagnosis not present

## 2017-11-24 DIAGNOSIS — E119 Type 2 diabetes mellitus without complications: Secondary | ICD-10-CM

## 2017-11-24 LAB — POCT GLYCOSYLATED HEMOGLOBIN (HGB A1C): Hemoglobin A1C: 6.4 % — AB (ref 4.0–5.6)

## 2017-11-24 NOTE — Patient Instructions (Signed)
   The CDC recommends two doses of Shingrix (the shingles vaccine) separated by 2 to 6 months for adults age 75 years and older. I recommend checking with your insurance plan regarding coverage for this vaccine.   

## 2018-01-04 ENCOUNTER — Ambulatory Visit (INDEPENDENT_AMBULATORY_CARE_PROVIDER_SITE_OTHER): Payer: PPO | Admitting: Family Medicine

## 2018-01-04 ENCOUNTER — Ambulatory Visit (INDEPENDENT_AMBULATORY_CARE_PROVIDER_SITE_OTHER): Payer: PPO

## 2018-01-04 ENCOUNTER — Encounter: Payer: Self-pay | Admitting: Family Medicine

## 2018-01-04 VITALS — BP 134/72 | HR 69 | Temp 97.9°F | Ht 62.0 in | Wt 166.4 lb

## 2018-01-04 DIAGNOSIS — I10 Essential (primary) hypertension: Secondary | ICD-10-CM | POA: Diagnosis not present

## 2018-01-04 DIAGNOSIS — Z0000 Encounter for general adult medical examination without abnormal findings: Secondary | ICD-10-CM

## 2018-01-04 DIAGNOSIS — Z1239 Encounter for other screening for malignant neoplasm of breast: Secondary | ICD-10-CM

## 2018-01-04 DIAGNOSIS — Z1231 Encounter for screening mammogram for malignant neoplasm of breast: Secondary | ICD-10-CM | POA: Diagnosis not present

## 2018-01-04 DIAGNOSIS — E782 Mixed hyperlipidemia: Secondary | ICD-10-CM

## 2018-01-04 DIAGNOSIS — E2839 Other primary ovarian failure: Secondary | ICD-10-CM | POA: Diagnosis not present

## 2018-01-04 NOTE — Patient Instructions (Signed)
Destiny Gilbert , Thank you for taking time to come for your Medicare Wellness Visit. I appreciate your ongoing commitment to your health goals. Please review the following plan we discussed and let me know if I can assist you in the future.   Screening recommendations/referrals: Colonoscopy: Up to date Mammogram: Up to date Bone Density: Up to date Recommended yearly ophthalmology/optometry visit for glaucoma screening and checkup Recommended yearly dental visit for hygiene and checkup  Vaccinations: Influenza vaccine: Up to date Pneumococcal vaccine: Up to date Tdap vaccine: Pt declines today.  Shingles vaccine: Pt declines today.     Advanced directives: Advance directive discussed with you today. Even though you declined this today please call our office should you change your mind and we can give you the proper paperwork for you to fill out.  Conditions/risks identified: Obesity- recommend to cut back on sugar and sweets in daily diet and subsitute for healthier snack.   Next appointment: 10:20 AM today with Dr Caryn Section.    Preventive Care 28 Years and Older, Female Preventive care refers to lifestyle choices and visits with your health care provider that can promote health and wellness. What does preventive care include?  A yearly physical exam. This is also called an annual well check.  Dental exams once or twice a year.  Routine eye exams. Ask your health care provider how often you should have your eyes checked.  Personal lifestyle choices, including:  Daily care of your teeth and gums.  Regular physical activity.  Eating a healthy diet.  Avoiding tobacco and drug use.  Limiting alcohol use.  Practicing safe sex.  Taking low-dose aspirin every day.  Taking vitamin and mineral supplements as recommended by your health care provider. What happens during an annual well check? The services and screenings done by your health care provider during your annual well check  will depend on your age, overall health, lifestyle risk factors, and family history of disease. Counseling  Your health care provider may ask you questions about your:  Alcohol use.  Tobacco use.  Drug use.  Emotional well-being.  Home and relationship well-being.  Sexual activity.  Eating habits.  History of falls.  Memory and ability to understand (cognition).  Work and work Statistician.  Reproductive health. Screening  You may have the following tests or measurements:  Height, weight, and BMI.  Blood pressure.  Lipid and cholesterol levels. These may be checked every 5 years, or more frequently if you are over 52 years old.  Skin check.  Lung cancer screening. You may have this screening every year starting at age 65 if you have a 30-pack-year history of smoking and currently smoke or have quit within the past 15 years.  Fecal occult blood test (FOBT) of the stool. You may have this test every year starting at age 25.  Flexible sigmoidoscopy or colonoscopy. You may have a sigmoidoscopy every 5 years or a colonoscopy every 10 years starting at age 53.  Hepatitis C blood test.  Hepatitis B blood test.  Sexually transmitted disease (STD) testing.  Diabetes screening. This is done by checking your blood sugar (glucose) after you have not eaten for a while (fasting). You may have this done every 1-3 years.  Bone density scan. This is done to screen for osteoporosis. You may have this done starting at age 30.  Mammogram. This may be done every 1-2 years. Talk to your health care provider about how often you should have regular mammograms. Talk with your  health care provider about your test results, treatment options, and if necessary, the need for more tests. Vaccines  Your health care provider may recommend certain vaccines, such as:  Influenza vaccine. This is recommended every year.  Tetanus, diphtheria, and acellular pertussis (Tdap, Td) vaccine. You may  need a Td booster every 10 years.  Zoster vaccine. You may need this after age 45.  Pneumococcal 13-valent conjugate (PCV13) vaccine. One dose is recommended after age 18.  Pneumococcal polysaccharide (PPSV23) vaccine. One dose is recommended after age 70. Talk to your health care provider about which screenings and vaccines you need and how often you need them. This information is not intended to replace advice given to you by your health care provider. Make sure you discuss any questions you have with your health care provider. Document Released: 07/20/2015 Document Revised: 03/12/2016 Document Reviewed: 04/24/2015 Elsevier Interactive Patient Education  2017 [place] Prevention in the Home Falls can cause injuries. They can happen to people of all ages. There are many things you can do to make your home safe and to help prevent falls. What can I do on the outside of my home?  Regularly fix the edges of walkways and driveways and fix any cracks.  Remove anything that might make you trip as you walk through a door, such as a raised step or threshold.  Trim any bushes or trees on the path to your home.  Use bright outdoor lighting.  Clear any walking paths of anything that might make someone trip, such as rocks or tools.  Regularly check to see if handrails are loose or broken. Make sure that both sides of any steps have handrails.  Any raised decks and porches should have guardrails on the edges.  Have any leaves, snow, or ice cleared regularly.  Use sand or salt on walking paths during winter.  Clean up any spills in your garage right away. This includes oil or grease spills. What can I do in the bathroom?  Use night lights.  Install grab bars by the toilet and in the tub and shower. Do not use towel bars as grab bars.  Use non-skid mats or decals in the tub or shower.  If you need to sit down in the shower, use a plastic, non-slip stool.  Keep the floor  dry. Clean up any water that spills on the floor as soon as it happens.  Remove soap buildup in the tub or shower regularly.  Attach bath mats securely with double-sided non-slip rug tape.  Do not have throw rugs and other things on the floor that can make you trip. What can I do in the bedroom?  Use night lights.  Make sure that you have a light by your bed that is easy to reach.  Do not use any sheets or blankets that are too big for your bed. They should not hang down onto the floor.  Have a firm chair that has side arms. You can use this for support while you get dressed.  Do not have throw rugs and other things on the floor that can make you trip. What can I do in the kitchen?  Clean up any spills right away.  Avoid walking on wet floors.  Keep items that you use a lot in easy-to-reach places.  If you need to reach something above you, use a strong step stool that has a grab bar.  Keep electrical cords out of the way.  Do not use  floor polish or wax that makes floors slippery. If you must use wax, use non-skid floor wax.  Do not have throw rugs and other things on the floor that can make you trip. What can I do with my stairs?  Do not leave any items on the stairs.  Make sure that there are handrails on both sides of the stairs and use them. Fix handrails that are broken or loose. Make sure that handrails are as long as the stairways.  Check any carpeting to make sure that it is firmly attached to the stairs. Fix any carpet that is loose or worn.  Avoid having throw rugs at the top or bottom of the stairs. If you do have throw rugs, attach them to the floor with carpet tape.  Make sure that you have a light switch at the top of the stairs and the bottom of the stairs. If you do not have them, ask someone to add them for you. What else can I do to help prevent falls?  Wear shoes that:  Do not have high heels.  Have rubber bottoms.  Are comfortable and fit you  well.  Are closed at the toe. Do not wear sandals.  If you use a stepladder:  Make sure that it is fully opened. Do not climb a closed stepladder.  Make sure that both sides of the stepladder are locked into place.  Ask someone to hold it for you, if possible.  Clearly mark and make sure that you can see:  Any grab bars or handrails.  First and last steps.  Where the edge of each step is.  Use tools that help you move around (mobility aids) if they are needed. These include:  Canes.  Walkers.  Scooters.  Crutches.  Turn on the lights when you go into a dark area. Replace any light bulbs as soon as they burn out.  Set up your furniture so you have a clear path. Avoid moving your furniture around.  If any of your floors are uneven, fix them.  If there are any pets around you, be aware of where they are.  Review your medicines with your doctor. Some medicines can make you feel dizzy. This can increase your chance of falling. Ask your doctor what other things that you can do to help prevent falls. This information is not intended to replace advice given to you by your health care provider. Make sure you discuss any questions you have with your health care provider. Document Released: 04/19/2009 Document Revised: 11/29/2015 Document Reviewed: 07/28/2014 Elsevier Interactive Patient Education  2017 [employer].

## 2018-01-04 NOTE — Progress Notes (Signed)
Patient: Destiny Gilbert, Female    DOB: [birth year], 77 y.o.   MRN: [id number] Visit Date: 01/04/2018  Today's Provider: Lelon Huh, MD   Chief Complaint  Patient presents with  . Annual Exam   Subjective:   Patient saw McKenzie for AWV today at 9:30 pm.   Annual physical exam Destiny Gilbert is a 77 y.o. female who presents today for health maintenance and complete physical. She feels fairly well. She reports no regular exercising. She reports she is sleeping fairly well. Has appointment with ophthalmologist soon. Is aware of Shingrix vaccine and on waiting list at pharmacy.   ------------------------------------------------------------   Review of Systems  Constitutional: Negative for chills, fatigue and fever.  HENT: Negative for congestion, ear pain, rhinorrhea, sneezing and sore throat.   Eyes: Negative.  Negative for pain and redness.  Respiratory: Positive for wheezing. Negative for cough and shortness of breath.   Cardiovascular: Negative for chest pain and leg swelling.  Gastrointestinal: Negative for abdominal pain, blood in stool, constipation, diarrhea and nausea.  Endocrine: Negative for polydipsia and polyphagia.  Genitourinary: Negative.  Negative for dysuria, flank pain, hematuria, pelvic pain, vaginal bleeding and vaginal discharge.  Musculoskeletal: Negative for arthralgias, back pain, gait problem and joint swelling.  Skin: Negative for rash.  Allergic/Immunologic: Positive for environmental allergies.  Neurological: Negative.  Negative for dizziness, tremors, seizures, weakness, light-headedness, numbness and headaches.  Hematological: Negative for adenopathy.  Psychiatric/Behavioral: Negative.  Negative for behavioral problems, confusion and dysphoric mood. The patient is not nervous/anxious and is not hyperactive.     Social History      She  reports that she has never smoked. She has never used smokeless tobacco. She reports that she drinks about  2.4 oz of alcohol per week. She reports that she does not use drugs.       Social History   Socioeconomic History  . Marital status: Married    Spouse name: Not on file  . Number of children: 3  . Years of education: Not on file  . Highest education level: Some college, no degree  Occupational History  . Occupation: Retired  Scientific laboratory technician  . Financial resource strain: Not hard at all  . Food insecurity:    Worry: Never true    Inability: Never true  . Transportation needs:    Medical: No    Non-medical: No  Tobacco Use  . Smoking status: Never Smoker  . Smokeless tobacco: Never Used  Substance and Sexual Activity  . Alcohol use: Yes    Alcohol/week: 2.4 oz    Types: 4 Glasses of wine per week  . Drug use: No  . Sexual activity: Not on file  Lifestyle  . Physical activity:    Days per week: Not on file    Minutes per session: Not on file  . Stress: Not at all  Relationships  . Social connections:    Talks on phone: Not on file    Gets together: Not on file    Attends religious service: Not on file    Active member of club or organization: Not on file    Attends meetings of clubs or organizations: Not on file    Relationship status: Not on file  Other Topics Concern  . Not on file  Social History Narrative  . Not on file    Past Medical History:  Diagnosis Date  . Diabetes mellitus without complication ([place])   . History of peptic  ulcer    perforated  . Hyperlipidemia   . Pneumonia 01/23/2015   [facility]      Patient Active Problem List   Diagnosis Date Noted  . Cerebrovascular disease 01/15/2017  . Acute CVA (cerebrovascular accident) ([place]) 01/05/2017  . Hyperplastic colon polyp 12/25/2016  . Occult blood in stools 10/14/2016  . Diabetes ([place]) 12/16/2009  . Asthma 12/21/2008  . Allergic rhinitis 04/16/2008  . Essential (primary) hypertension 05/03/2007  . Mixed hyperlipidemia 07/07/2006  . Peptic ulcer 07/08/2003  . Colon, diverticulosis 07/07/1998     Past Surgical History:  Procedure Laterality Date  . ABDOMINAL HYSTERECTOMY  1994   BSO  . CATARACT EXTRACTION     left eye- 09/2014; righr eye- 08/13/2014  . COLONOSCOPY WITH PROPOFOL N/A 12/23/2016   Procedure: COLONOSCOPY WITH PROPOFOL;  Surgeon: Lollie Sails, MD;  [facility] ENDOSCOPY;  Service: Endoscopy;  Laterality: N/A;  . INSERTION OF CARDIAC MONITOR    . LOOP RECORDER INSERTION N/A 07/29/2017   Procedure: LOOP RECORDER INSERTION;  Surgeon: Isaias Cowman, MD;  Location: [place] CV LAB;  Service: Cardiovascular;  Laterality: N/A;    Family History        Family Status  Relation Name Status  . Mother  Deceased  . Father  Deceased  . Sister  Alive  . Neg Hx  (Not Specified)        Her family history includes Hyperlipidemia in her sister; Hypertension in her sister; Lung cancer in her mother; Pancreatic cancer in her father. There is no history of Breast cancer.      Allergies  Allergen Reactions  . Dipyridamole Nausea Only    Headache  . Sulfa Antibiotics Nausea And Vomiting     Current Outpatient Medications:  .  acetaminophen (TYLENOL) 500 MG tablet, Take 500 mg by mouth every 6 (six) hours as needed., Disp: , Rfl:  .  albuterol (PROAIR HFA) 108 (90 BASE) MCG/ACT inhaler, Inhale 2 puffs into the lungs every 6 (six) hours as needed., Disp: , Rfl:  .  aspirin EC 81 MG tablet, Take 81 mg by mouth 3 (three) times a week. , Disp: , Rfl:  .  atorvastatin (LIPITOR) 40 MG tablet, Take 1 tablet (40 mg total) by mouth daily at 6 PM., Disp: 30 tablet, Rfl: 11 .  azelastine (ASTELIN) 0.1 % nasal spray, Place 0.3 mLs into both nostrils daily., Disp: , Rfl: 12 .  clopidogrel (PLAVIX) 75 MG tablet, Take 75 mg by mouth daily., Disp: , Rfl:  .  Fluticasone-Salmeterol (ADVAIR) 250-50 MCG/DOSE AEPB, INHALE 1 DOSE BY MOUTH TWICE DAILY, Disp: 60 each, Rfl: 12 .  Glucosamine-Chondroit-Vit C-Mn (GLUCOSAMINE CHONDR 1500 COMPLX) CAPS, Take 3-4 capsules by mouth  daily. Takes 3-4 capsules daily , Disp: , Rfl:  .  glucose blood (ONE TOUCH ULTRA TEST) test strip, Use to check blood sugar daily, Disp: 100 each, Rfl: 12 .  hydrocortisone (PROCTOCARE-HC) 2.5 % rectal cream, Place 1 application rectally 2 (two) times daily., Disp: 30 g, Rfl: 0 .  MAGNESIUM PO, Take 250 mg by mouth at bedtime., Disp: , Rfl:  .  metFORMIN (GLUCOPHAGE-XR) 500 MG 24 hr tablet, TAKE ONE TABLET BY MOUTH ONCE DAILY FOR  BLOOD  SUGAR, Disp: 30 tablet, Rfl: 12 .  MULTIPLE VITAMIN PO, Take 1 tablet by mouth daily., Disp: , Rfl:  .  nitroGLYCERIN (NITROSTAT) 0.3 MG SL tablet, Place 1 tablet (0.3 mg total) under the tongue every 5 (five) minutes as needed for chest pain., Disp:  90 tablet, Rfl: 0 .  Omega-3 Fatty Acids (FISH OIL) 1200 MG CAPS, Take 1 capsule by mouth daily. , Disp: , Rfl:  .  Triamcinolone Acetonide (NASACORT AQ NA), Place into the nose., Disp: , Rfl:  .  triamterene-hydrochlorothiazide (MAXZIDE-25) 37.5-25 MG tablet, TAKE ONE TABLET BY MOUTH ONCE DAILY, Disp: 90 tablet, Rfl: 4   Patient Care Team: Birdie Sons, MD as PCP - General (Family Medicine) Pa, [place] (Optometry) Lollie Sails, MD as Consulting Physician (Gastroenterology) Isaias Cowman, MD as Consulting Physician (Cardiology) Anabel Bene, MD as Referring Physician (Neurology) Clyde Canterbury, MD as Referring Physician (Otolaryngology)      Objective:    Vitals: BP 134/72 (BP Location: Right Arm)   Pulse 69   Temp 97.9 F (36.6 C) (Oral)   Ht 5\' 2"  (1.575 m)   Wt 166 lb 6.4 oz (75.5 kg)   BMI 30.43 kg/m   BSA 1.82 m     Physical Exam    General Appearance:    Alert, cooperative, no distress, appears stated age  Head:    Normocephalic, without obvious abnormality, atraumatic  Eyes:    PERRL, conjunctiva/corneas clear, EOM's intact, fundi    benign, both eyes  Ears:    Normal TM's and external ear canals, both ears  Nose:   Nares normal, septum midline, mucosa  normal, no drainage    or sinus tenderness  Throat:   Lips, mucosa, and tongue normal; teeth and gums normal  Neck:   Supple, symmetrical, trachea midline, no adenopathy;    thyroid:  no enlargement/tenderness/nodules; no carotid   bruit or JVD  Back:     Symmetric, no curvature, ROM normal, no CVA tenderness  Lungs:     Clear to auscultation bilaterally, respirations unlabored  Chest Wall:    No tenderness or deformity   Heart:    Regular rate and rhythm, S1 and S2 normal, no murmur, rub   or gallop  Breast Exam:    normal appearance, no masses or tenderness  Abdomen:     Soft, non-tender, bowel sounds active all four quadrants,    no masses, no organomegaly  Pelvic:    deferred  Extremities:   Extremities normal, atraumatic, no cyanosis or edema  Pulses:   2+ and symmetric all extremities  Skin:   Skin color, texture, turgor normal, no rashes or lesions  Lymph nodes:   Cervical, supraclavicular, and axillary nodes normal  Neurologic:   CNII-XII intact, normal strength, sensation and reflexes    throughout    Depression Screen PHQ 2/9 Scores 01/04/2018 05/27/2017 01/02/2017 01/02/2017  PHQ - 2 Score 0 0 0 0  PHQ- 9 Score - 0 0 -    Audit-C Alcohol Use Screening   Alcohol Use Disorder Test (AUDIT) 01/04/2018  1. How often do you have a drink containing alcohol? 2  2. How many drinks containing alcohol do you have on a typical day when you are drinking? 0  3. How often do you have six or more drinks on one occasion? 0  AUDIT-C Score 2    A score of 3 or more in women, and 4 or more in men indicates increased risk for alcohol abuse, EXCEPT if all of the points are from question 1   Assessment & Plan:     Routine Health Maintenance and Physical Exam  Exercise Activities and Dietary recommendations Goals    . DIET - REDUCE SUGAR INTAKE     Recommend to cut  back on sugar and sweets in daily diet and subsitute for healthier snack.        Immunization History  Administered  Date(s) Administered  . Influenza, High Dose Seasonal PF 03/18/2016, 03/27/2017  . Influenza,inj,Quad PF,6+ Mos 05/17/2015  . Pneumococcal Conjugate-13 05/20/2016  . Pneumococcal Polysaccharide-23 04/12/2008, 09/12/2013  . Tdap 05/03/2007  . Zoster 06/12/2009    Health Maintenance  Topic Date Due  . FOOT EXAM  09[birth year]/1952  . OPHTHALMOLOGY EXAM  09/23/1950  . TETANUS/TDAP  05/02/2017  . URINE MICROALBUMIN  09/11/2017  . INFLUENZA VACCINE  02/04/2018  . HEMOGLOBIN A1C  05/27/2018  . DEXA SCAN  07/05/2019  . COLONOSCOPY  12/24/2019  . PNA vac Low Risk Adult  Completed     Discussed health benefits of physical activity, and encouraged her to engage in regular exercise appropriate for her age and condition.    --------------------------------------------------------------------  1. Annual physical exam Generally doing well.   2. Essential (primary) hypertension Well controlled.  Continue current medications.   - Comprehensive metabolic panel - EKG 17-HXTA  3. Breast cancer screening Normal breast exam today.   4. Estrogen deficiency  - DG Bone Density; Future  5. Mixed hyperlipidemia She is tolerating atorvastatin well with no adverse effects.   - Comprehensive metabolic panel - Lipid panel   Lelon Huh, MD  [facility]

## 2018-01-04 NOTE — Patient Instructions (Signed)
   The CDC recommends two doses of Shingrix (the shingles vaccine) separated by 2 to 6 months for adults age 75 years and older. I recommend checking with your insurance plan regarding coverage for this vaccine.   

## 2018-01-04 NOTE — Progress Notes (Signed)
Subjective:   Destiny Gilbert is a 77 y.o. female who presents for Medicare Annual (Subsequent) preventive examination.  Review of Systems:  N/A  Cardiac Risk Factors include: advanced age (>45men, >55 women);diabetes mellitus;dyslipidemia;hypertension;obesity (BMI >30kg/m2)     Objective:     Vitals: BP 134/72 (BP Location: Right Arm)   Pulse 69   Temp 97.9 F (36.6 C) (Oral)   Ht 5\' 2"  (1.575 m)   Wt 166 lb 6.4 oz (75.5 kg)   BMI 30.43 kg/m   Body mass index is 30.43 kg/m.  Advanced Directives 01/04/2018 07/29/2017 07/29/2017 01/05/2017 01/05/2017 01/02/2017 12/23/2016  Does Patient Have a Medical Advance Directive? No No No No No No No  Would patient like information on creating a medical advance directive? No - Patient declined No - Patient declined No - Patient declined No - Patient declined No - Patient declined No - Patient declined -    Tobacco Social History   Tobacco Use  Smoking Status Never Smoker  Smokeless Tobacco Never Used     Counseling given: Not Answered   Clinical Intake:  Pre-visit preparation completed: Yes  Pain : No/denies pain Pain Score: 0-No pain     Nutritional Status: BMI > 30  Obese Nutritional Risks: None Diabetes: Yes(type 2) CBG done?: No Did pt. bring in CBG monitor from home?: No  How often do you need to have someone help you when you read instructions, pamphlets, or other written materials from your doctor or pharmacy?: 1 - Never  Interpreter Needed?: No  Information entered by :: [facility], LPN  Past Medical History:  Diagnosis Date  . Diabetes mellitus without complication ([place])   . History of peptic ulcer    perforated  . Hyperlipidemia   . Pneumonia 01/23/2015   [facility]    Past Surgical History:  Procedure Laterality Date  . ABDOMINAL HYSTERECTOMY  1994   BSO  . CATARACT EXTRACTION     left eye- 09/2014; righr eye- 08/13/2014  . COLONOSCOPY WITH PROPOFOL N/A 12/23/2016   Procedure: COLONOSCOPY WITH PROPOFOL;   Surgeon: Lollie Sails, MD;  Location: [facility] ENDOSCOPY;  Service: Endoscopy;  Laterality: N/A;  . INSERTION OF CARDIAC MONITOR    . LOOP RECORDER INSERTION N/A 07/29/2017   Procedure: LOOP RECORDER INSERTION;  Surgeon: Isaias Cowman, MD;  Location: [place] CV LAB;  Service: Cardiovascular;  Laterality: N/A;   Family History  Problem Relation Age of Onset  . Lung cancer Mother   . Pancreatic cancer Father   . Hypertension Sister   . Hyperlipidemia Sister   . Breast cancer Neg Hx    Social History   Socioeconomic History  . Marital status: Married    Spouse name: Not on file  . Number of children: 3  . Years of education: Not on file  . Highest education level: Some college, no degree  Occupational History  . Occupation: Retired  Scientific laboratory technician  . Financial resource strain: Not hard at all  . Food insecurity:    Worry: Never true    Inability: Never true  . Transportation needs:    Medical: No    Non-medical: No  Tobacco Use  . Smoking status: Never Smoker  . Smokeless tobacco: Never Used  Substance and Sexual Activity  . Alcohol use: Yes    Alcohol/week: 2.4 oz    Types: 4 Glasses of wine per week  . Drug use: No  . Sexual activity: Not on file  Lifestyle  . Physical activity:  Days per week: Not on file    Minutes per session: Not on file  . Stress: Not at all  Relationships  . Social connections:    Talks on phone: Not on file    Gets together: Not on file    Attends religious service: Not on file    Active member of club or organization: Not on file    Attends meetings of clubs or organizations: Not on file    Relationship status: Not on file  Other Topics Concern  . Not on file  Social History Narrative  . Not on file    Outpatient Encounter Medications as of 01/04/2018  Medication Sig  . acetaminophen (TYLENOL) 500 MG tablet Take 500 mg by mouth every 6 (six) hours as needed.  Marland Kitchen albuterol (PROAIR HFA) 108 (90 BASE) MCG/ACT inhaler Inhale  2 puffs into the lungs every 6 (six) hours as needed.  Marland Kitchen aspirin EC 81 MG tablet Take 81 mg by mouth 3 (three) times a week.   Marland Kitchen atorvastatin (LIPITOR) 40 MG tablet Take 1 tablet (40 mg total) by mouth daily at 6 PM.  . azelastine (ASTELIN) 0.1 % nasal spray Place 0.3 mLs into both nostrils daily.  . clopidogrel (PLAVIX) 75 MG tablet Take 75 mg by mouth daily.  . Fluticasone-Salmeterol (ADVAIR) 250-50 MCG/DOSE AEPB INHALE 1 DOSE BY MOUTH TWICE DAILY  . Glucosamine-Chondroit-Vit C-Mn (GLUCOSAMINE CHONDR 1500 COMPLX) CAPS Take 3-4 capsules by mouth daily. Takes 3-4 capsules daily   . glucose blood (ONE TOUCH ULTRA TEST) test strip Use to check blood sugar daily  . metFORMIN (GLUCOPHAGE-XR) 500 MG 24 hr tablet TAKE ONE TABLET BY MOUTH ONCE DAILY FOR  BLOOD  SUGAR  . MULTIPLE VITAMIN PO Take 1 tablet by mouth daily.  . nitroGLYCERIN (NITROSTAT) 0.3 MG SL tablet Place 1 tablet (0.3 mg total) under the tongue every 5 (five) minutes as needed for chest pain.  Marland Kitchen triamterene-hydrochlorothiazide (MAXZIDE-25) 37.5-25 MG tablet TAKE ONE TABLET BY MOUTH ONCE DAILY  . hydrocortisone (PROCTOCARE-HC) 2.5 % rectal cream Place 1 application rectally 2 (two) times daily.  Marland Kitchen MAGNESIUM PO Take 250 mg by mouth at bedtime.  . Omega-3 Fatty Acids (FISH OIL) 1200 MG CAPS Take 1 capsule by mouth daily.   . Triamcinolone Acetonide (NASACORT AQ NA) Place into the nose.   No facility-administered encounter medications on file as of 01/04/2018.     Activities of Daily Living In your present state of health, do you have any difficulty performing the following activities: 01/04/2018 05/27/2017  Hearing? N N  Vision? N N  Difficulty concentrating or making decisions? N N  Walking or climbing stairs? N N  Dressing or bathing? N N  Doing errands, shopping? N N  Preparing Food and eating ? N -  Using the Toilet? N -  In the past six months, have you accidently leaked urine? N -  Do you have problems with loss of bowel  control? N -  Managing your Medications? N -  Managing your Finances? N -  Housekeeping or managing your Housekeeping? N -  Some recent data might be hidden    Patient Care Team: Birdie Sons, MD as PCP - General (Family Medicine) Pa, [place] (Optometry) Lollie Sails, MD as Consulting Physician (Gastroenterology) Isaias Cowman, MD as Consulting Physician (Cardiology) Anabel Bene, MD as Referring Physician (Neurology) Clyde Canterbury, MD as Referring Physician (Otolaryngology)    Assessment:   This is a routine wellness examination for  Inez Catalina.  Exercise Activities and Dietary recommendations Current Exercise Habits: The patient does not participate in regular exercise at present, Exercise limited by: None identified  Goals    . DIET - REDUCE SUGAR INTAKE     Recommend to cut back on sugar and sweets in daily diet and subsitute for healthier snack.        Fall Risk Fall Risk  01/04/2018 05/27/2017 01/02/2017 05/20/2016 05/17/2015  Falls in the past year? No No No No No   Is the patient's home free of loose throw rugs in walkways, pet beds, electrical cords, etc?   yes      Grab bars in the bathroom? no      Handrails on the stairs?   no      Adequate lighting?   yes  Timed Get Up and Go performed: N/A  Depression Screen PHQ 2/9 Scores 01/04/2018 05/27/2017 01/02/2017 01/02/2017  PHQ - 2 Score 0 0 0 0  PHQ- 9 Score - 0 0 -     Cognitive Function: Pt declined screening today.     6CIT Screen 05/27/2017 01/02/2017  What Year? 0 points 0 points  What month? 0 points 0 points  What time? 0 points 0 points  Count back from 20 0 points 0 points  Months in reverse 0 points 0 points  Repeat phrase 2 points 0 points  Total Score 2 0    Immunization History  Administered Date(s) Administered  . Influenza, High Dose Seasonal PF 03/18/2016, 03/27/2017  . Influenza,inj,Quad PF,6+ Mos 05/17/2015  . Pneumococcal Conjugate-13 05/20/2016  .  Pneumococcal Polysaccharide-23 04/12/2008, 09/12/2013  . Tdap 05/03/2007  . Zoster 06/12/2009    Qualifies for Shingles Vaccine? Due for Shingles vaccine. Declined my offer to administer today. Education has been provided regarding the importance of this vaccine. Pt has been advised to call her insurance company to determine her out of pocket expense. Advised she may also receive this vaccine at her local pharmacy or Health Dept. Verbalized acceptance and understanding.  Screening Tests Health Maintenance  Topic Date Due  . FOOT EXAM  09/23/1950  . OPHTHALMOLOGY EXAM  09/23/1950  . TETANUS/TDAP  05/02/2017  . URINE MICROALBUMIN  09/11/2017  . INFLUENZA VACCINE  02/04/2018  . HEMOGLOBIN A1C  05/27/2018  . DEXA SCAN  07/05/2019  . COLONOSCOPY  12/24/2019  . PNA vac Low Risk Adult  Completed    Cancer Screenings: Lung: Low Dose CT Chest recommended if Age 64-80 years, 30 pack-year currently smoking OR have quit w/in 15years. Patient does not qualify. Breast:  Up to date on Mammogram? Yes   Up to date of Bone Density/Dexa? Yes Colorectal: Up to date  Additional Screenings:  Hepatitis C Screening: N/A     Plan:  I have personally reviewed and addressed the Medicare Annual Wellness questionnaire and have noted the following in the patient's chart:  A. Medical and social history B. Use of alcohol, tobacco or illicit drugs  C. Current medications and supplements D. Functional ability and status E.  Nutritional status F.  Physical activity G. Advance directives H. List of other physicians I.  Hospitalizations, surgeries, and ER visits in previous 12 months J.  [place] such as hearing and vision if needed, cognitive and depression L. Referrals and appointments - none  In addition, I have reviewed and discussed with patient certain preventive protocols, quality metrics, and best practice recommendations. A written personalized care plan for preventive services as well  as general preventive health  recommendations were provided to patient.  See attached scanned questionnaire for additional information.   Signed,  Fabio Neighbors, LPN Nurse Health Advisor   Nurse Recommendations: Pt needs a diabetic foot exam and urine micoalbumin today. Pt plans to set up an eye exam this year. Pt declined the tetanus vaccine today.

## 2018-01-05 LAB — COMPREHENSIVE METABOLIC PANEL
ALK PHOS: 76 IU/L (ref 39–117)
ALT: 11 IU/L (ref 0–32)
AST: 18 IU/L (ref 0–40)
Albumin/Globulin Ratio: 1.7 (ref 1.2–2.2)
Albumin: 4.2 g/dL (ref 3.5–4.8)
BILIRUBIN TOTAL: 0.5 mg/dL (ref 0.0–1.2)
BUN / CREAT RATIO: 17 (ref 12–28)
BUN: 16 mg/dL (ref 8–27)
CO2: 25 mmol/L (ref 20–29)
Calcium: 9.9 mg/dL (ref 8.7–10.3)
Chloride: 102 mmol/L (ref 96–106)
Creatinine, Ser: 0.95 mg/dL (ref 0.57–1.00)
GFR calc Af Amer: 68 mL/min/{1.73_m2} (ref >59)
GFR calc non Af Amer: 59 mL/min/{1.73_m2} — ABNORMAL LOW (ref >59)
GLUCOSE: 102 mg/dL — AB (ref 65–99)
Globulin, Total: 2.5 g/dL (ref 1.5–4.5)
Potassium: 3.6 mmol/L (ref 3.5–5.2)
SODIUM: 143 mmol/L (ref 134–144)
Total Protein: 6.7 g/dL (ref 6.0–8.5)

## 2018-01-05 LAB — LIPID PANEL
CHOL/HDL RATIO: 2.8 ratio (ref 0.0–4.4)
Cholesterol, Total: 190 mg/dL (ref 100–199)
HDL: 69 mg/dL (ref >39)
LDL CALC: 98 mg/dL (ref 0–99)
TRIGLYCERIDES: 113 mg/dL (ref 0–149)
VLDL Cholesterol Cal: 23 mg/dL (ref 5–40)

## 2018-01-19 ENCOUNTER — Emergency Department: Payer: PPO

## 2018-01-19 ENCOUNTER — Other Ambulatory Visit: Payer: Self-pay

## 2018-01-19 ENCOUNTER — Inpatient Hospital Stay
Admission: EM | Admit: 2018-01-19 | Discharge: 2018-01-21 | DRG: 313 | Disposition: A | Discharge status: Home / Self Care | Payer: PPO | Attending: Internal Medicine | Admitting: Internal Medicine

## 2018-01-19 DIAGNOSIS — E782 Mixed hyperlipidemia: Secondary | ICD-10-CM | POA: Diagnosis not present

## 2018-01-19 DIAGNOSIS — N179 Acute kidney failure, unspecified: Secondary | ICD-10-CM | POA: Diagnosis present

## 2018-01-19 DIAGNOSIS — I10 Essential (primary) hypertension: Secondary | ICD-10-CM | POA: Diagnosis not present

## 2018-01-19 DIAGNOSIS — Z7902 Long term (current) use of antithrombotics/antiplatelets: Secondary | ICD-10-CM

## 2018-01-19 DIAGNOSIS — Z7982 Long term (current) use of aspirin: Secondary | ICD-10-CM | POA: Diagnosis not present

## 2018-01-19 DIAGNOSIS — Z888 Allergy status to other drugs, medicaments and biological substances status: Secondary | ICD-10-CM | POA: Diagnosis not present

## 2018-01-19 DIAGNOSIS — E039 Hypothyroidism, unspecified: Secondary | ICD-10-CM | POA: Diagnosis present

## 2018-01-19 DIAGNOSIS — Z7951 Long term (current) use of inhaled steroids: Secondary | ICD-10-CM

## 2018-01-19 DIAGNOSIS — F419 Anxiety disorder, unspecified: Secondary | ICD-10-CM | POA: Diagnosis present

## 2018-01-19 DIAGNOSIS — E860 Dehydration: Secondary | ICD-10-CM | POA: Diagnosis present

## 2018-01-19 DIAGNOSIS — E876 Hypokalemia: Secondary | ICD-10-CM | POA: Diagnosis present

## 2018-01-19 DIAGNOSIS — Z79899 Other long term (current) drug therapy: Secondary | ICD-10-CM | POA: Diagnosis not present

## 2018-01-19 DIAGNOSIS — E119 Type 2 diabetes mellitus without complications: Secondary | ICD-10-CM | POA: Diagnosis not present

## 2018-01-19 DIAGNOSIS — Z7984 Long term (current) use of oral hypoglycemic drugs: Secondary | ICD-10-CM | POA: Diagnosis not present

## 2018-01-19 DIAGNOSIS — R079 Chest pain, unspecified: Secondary | ICD-10-CM | POA: Diagnosis not present

## 2018-01-19 DIAGNOSIS — Z8711 Personal history of peptic ulcer disease: Secondary | ICD-10-CM

## 2018-01-19 DIAGNOSIS — R0789 Other chest pain: Principal | ICD-10-CM | POA: Diagnosis present

## 2018-01-19 DIAGNOSIS — Z882 Allergy status to sulfonamides status: Secondary | ICD-10-CM

## 2018-01-19 DIAGNOSIS — R9431 Abnormal electrocardiogram [ECG] [EKG]: Secondary | ICD-10-CM | POA: Diagnosis not present

## 2018-01-19 DIAGNOSIS — Z8673 Personal history of transient ischemic attack (TIA), and cerebral infarction without residual deficits: Secondary | ICD-10-CM

## 2018-01-19 DIAGNOSIS — I200 Unstable angina: Secondary | ICD-10-CM | POA: Diagnosis not present

## 2018-01-19 DIAGNOSIS — I6359 Cerebral infarction due to unspecified occlusion or stenosis of other cerebral artery: Secondary | ICD-10-CM | POA: Diagnosis not present

## 2018-01-19 DIAGNOSIS — E1151 Type 2 diabetes mellitus with diabetic peripheral angiopathy without gangrene: Secondary | ICD-10-CM | POA: Diagnosis present

## 2018-01-19 LAB — COMPREHENSIVE METABOLIC PANEL
ALT: 19 U/L (ref 0–44)
AST: 31 U/L (ref 15–41)
Albumin: 4 g/dL (ref 3.5–5.0)
Alkaline Phosphatase: 64 U/L (ref 38–126)
Anion gap: 8 (ref 5–15)
BUN: 26 mg/dL — ABNORMAL HIGH (ref 8–23)
CALCIUM: 9.4 mg/dL (ref 8.9–10.3)
CHLORIDE: 103 mmol/L (ref 98–111)
CO2: 27 mmol/L (ref 22–32)
CREATININE: 1.3 mg/dL — AB (ref 0.44–1.00)
GFR, EST AFRICAN AMERICAN: 45 mL/min — AB (ref >60)
GFR, EST NON AFRICAN AMERICAN: 39 mL/min — AB (ref >60)
Glucose, Bld: 119 mg/dL — ABNORMAL HIGH (ref 70–99)
Potassium: 3.2 mmol/L — ABNORMAL LOW (ref 3.5–5.1)
Sodium: 138 mmol/L (ref 135–145)
Total Bilirubin: 0.6 mg/dL (ref 0.3–1.2)
Total Protein: 7.6 g/dL (ref 6.5–8.1)

## 2018-01-19 LAB — CBC
HCT: 39.9 % (ref 35.0–47.0)
Hemoglobin: 13.5 g/dL (ref 12.0–16.0)
MCH: 32.4 pg (ref 26.0–34.0)
MCHC: 33.9 g/dL (ref 32.0–36.0)
MCV: 95.6 fL (ref 80.0–100.0)
PLATELETS: 300 10*3/uL (ref 150–440)
RBC: 4.17 MIL/uL (ref 3.80–5.20)
RDW: 13.5 % (ref 11.5–14.5)
WBC: 8.8 10*3/uL (ref 3.6–11.0)

## 2018-01-19 LAB — TROPONIN I

## 2018-01-19 MED ORDER — ASPIRIN 81 MG PO CHEW
324.0000 mg | CHEWABLE_TABLET | Freq: Once | ORAL | Status: AC
  Administered 2018-01-19: 324 mg via ORAL
  Filled 2018-01-19: qty 4

## 2018-01-19 NOTE — ED Provider Notes (Signed)
[facility] Emergency Department Provider Note ____________________  Time seen: 10:54 PM I have reviewed the triage vital signs and the nursing notes.   HISTORY  Chief Complaint Chest Pain    HPI Destiny Gilbert is a 77 y.o. female low list of chronic medical conditions including diabetes hyperlipidemia and hypertension presents to the emergency department acute onset of nonradiating left-sided chest pain which began at 8 PM and been persistent since onset.  Patient took 2 sublingual nitroglycerin at home to which she has received some relief after arriving to the emergency department.  She denies any dyspnea no diaphoresis or dizziness.   Past Medical History:  Diagnosis Date  . Diabetes mellitus without complication ([place])   . History of peptic ulcer    perforated  . Hyperlipidemia   . Pneumonia 01/23/2015   [facility]     Patient Active Problem List   Diagnosis Date Noted  . Cerebrovascular disease 01/15/2017  . Acute CVA (cerebrovascular accident) ([place]) 01/05/2017  . Hyperplastic colon polyp 12/25/2016  . Occult blood in stools 10/14/2016  . Diabetes ([place]) 12/16/2009  . Asthma 12/21/2008  . Allergic rhinitis 04/16/2008  . Essential (primary) hypertension 05/03/2007  . Mixed hyperlipidemia 07/07/2006  . Peptic ulcer 07/08/2003  . Colon, diverticulosis 07/07/1998    Past Surgical History:  Procedure Laterality Date  . ABDOMINAL HYSTERECTOMY  1994   BSO  . CATARACT EXTRACTION     left eye- 09/2014; righr eye- 08/13/2014  . COLONOSCOPY WITH PROPOFOL N/A 12/23/2016   Procedure: COLONOSCOPY WITH PROPOFOL;  Surgeon: Lollie Sails, MD;  Location: [facility] ENDOSCOPY;  Service: Endoscopy;  Laterality: N/A;  . INSERTION OF CARDIAC MONITOR    . LOOP RECORDER INSERTION N/A 07/29/2017   Procedure: LOOP RECORDER INSERTION;  Surgeon: Isaias Cowman, MD;  Location: [place] CV LAB;  Service: Cardiovascular;  Laterality: N/A;    Prior to Admission  medications   Medication Sig Start Date End Date Taking? Authorizing Provider  acetaminophen (TYLENOL) 500 MG tablet Take 500 mg by mouth every 6 (six) hours as needed.    [provider]  albuterol (PROAIR HFA) 108 (90 BASE) MCG/ACT inhaler Inhale 2 puffs into the lungs every 6 (six) hours as needed. 10/01/12   [provider]  aspirin EC 81 MG tablet Take 81 mg by mouth 3 (three) times a week.     [provider]  atorvastatin (LIPITOR) 40 MG tablet Take 1 tablet (40 mg total) by mouth daily at 6 PM. 03/06/17   Fisher, Kirstie Peri, MD  azelastine (ASTELIN) 0.1 % nasal spray Place 0.3 mLs into both nostrils daily. 09/28/17   [provider]  clopidogrel (PLAVIX) 75 MG tablet Take 75 mg by mouth daily.    [provider]  Fluticasone-Salmeterol (ADVAIR) 250-50 MCG/DOSE AEPB INHALE 1 DOSE BY MOUTH TWICE DAILY 10/16/17   Birdie Sons, MD  Glucosamine-Chondroit-Vit C-Mn (GLUCOSAMINE CHONDR 1500 COMPLX) CAPS Take 3-4 capsules by mouth daily. Takes 3-4 capsules daily     [provider]  glucose blood (ONE TOUCH ULTRA TEST) test strip Use to check blood sugar daily 12/28/15   Birdie Sons, MD  hydrocortisone (PROCTOCARE-HC) 2.5 % rectal cream Place 1 application rectally 2 (two) times daily. 10/01/17   Birdie Sons, MD  MAGNESIUM PO Take 250 mg by mouth at bedtime.    [provider]  metFORMIN (GLUCOPHAGE-XR) 500 MG 24 hr tablet TAKE ONE TABLET BY MOUTH ONCE DAILY FOR  BLOOD  SUGAR  03/16/17   Birdie Sons, MD  MULTIPLE VITAMIN PO Take 1 tablet by mouth daily. 04/12/08   [provider]  nitroGLYCERIN (NITROSTAT) 0.3 MG SL tablet Place 1 tablet (0.3 mg total) under the tongue every 5 (five) minutes as needed for chest pain. 01/06/17   Henreitta Leber, MD  Omega-3 Fatty Acids (FISH OIL) 1200 MG CAPS Take 1 capsule by mouth daily.     [provider]  Triamcinolone Acetonide (NASACORT AQ NA) Place into the nose.     [provider]  triamterene-hydrochlorothiazide (MAXZIDE-25) 37.5-25 MG tablet TAKE ONE TABLET BY MOUTH ONCE DAILY 02/23/17   Birdie Sons, MD    Allergies Dipyridamole and Sulfa antibiotics  Family History  Problem Relation Age of Onset  . Lung cancer Mother   . Pancreatic cancer Father   . Hypertension Sister   . Hyperlipidemia Sister   . Breast cancer Neg Hx     Social History Social History   Tobacco Use  . Smoking status: Never Smoker  . Smokeless tobacco: Never Used  Substance Use Topics  . Alcohol use: Yes    Alcohol/week: 2.4 oz    Types: 4 Glasses of wine per week  . Drug use: No    Review of Systems Constitutional: No fever/chills Eyes: No visual changes. ENT: No sore throat. Cardiovascular: Positive for chest pain. Respiratory: Denies shortness of breath. Gastrointestinal: No abdominal pain.  No nausea, no vomiting.  No diarrhea.  No constipation. Genitourinary: Negative for dysuria. Musculoskeletal: Negative for neck pain.  Negative for back pain. Integumentary: Negative for rash. Neurological: Negative for headaches, focal weakness or numbness.   ____________________________________________   PHYSICAL EXAM:  VITAL SIGNS: ED Triage Vitals  Enc Vitals Group     BP 01/19/18 2132 (!) 162/75     Pulse Rate 01/19/18 2132 87     Resp 01/19/18 2132 20     Temp 01/19/18 2132 98.7 F (37.1 C)     Temp Source 01/19/18 2132 Oral     SpO2 01/19/18 2132 97 %     Weight 01/19/18 2129 74.8 kg (165 lb)     Height 01/19/18 2129 1.575 m (5\' 2" )     Head Circumference --      Peak Flow --      Pain Score 01/19/18 2129 7     Pain Loc --      Pain Edu? --      Excl. in [place]? --     Constitutional: Alert and oriented. Well appearing and in no acute distress. Eyes: Conjunctivae are normal.  Head: Atraumatic. Mouth/Throat: Mucous membranes are moist. Oropharynx non-erythematous. Neck: No stridor.   Cardiovascular: Normal rate, regular rhythm.  Good peripheral circulation. Grossly normal heart sounds. Respiratory: Normal respiratory effort.  No retractions. Lungs CTAB. Gastrointestinal: Soft and nontender. No distention.  Musculoskeletal: No lower extremity tenderness nor edema. No gross deformities of extremities. Neurologic:  Normal speech and language. No gross focal neurologic deficits are appreciated.  Skin:  Skin is warm, dry and intact. No rash noted. Psychiatric: Mood and affect are normal. Speech and behavior are normal.  ____________________________________________   LABS (all labs ordered are listed, but only abnormal results are displayed)  Labs Reviewed  COMPREHENSIVE METABOLIC PANEL - Abnormal; Notable for the following components:      Result Value   Potassium 3.2 (*)    Glucose, Bld 119 (*)    BUN 26 (*)    Creatinine, Ser 1.30 (*)  GFR calc non Af Amer 39 (*)    GFR calc Af Amer 45 (*)    All other components within normal limits  CBC  TROPONIN I  FIBRIN DERIVATIVES D-DIMER ([facility] ONLY)   ____________________________________________  EKG  ED ECG REPORT I, [place] N Raeshaun Simson, the attending physician, personally viewed and interpreted this ECG.   Date: 01/19/2018  EKG Time: 9:31 PM  Rate: 81  Rhythm: Normal sinus rhythm  Axis: Normal  Intervals: Normal  ST&T Change: None  ____________________________________________  RADIOLOGY I, [place] Saylor Sheckler, personally viewed and evaluated these images (plain radiographs) as part of my medical decision making, as well as reviewing the written report by the radiologist.  ED MD interpretation: No acute pulmonary disease  Official radiology report(s): Dg Chest 2 View  Result Date: 01/19/2018 CLINICAL DATA:  Chest pain with some relief after nitroglycerin. EXAM: CHEST - 2 VIEW COMPARISON:  None. FINDINGS: The heart size and mediastinal contours are within normal limits. Moderate aortic atherosclerosis at the arch without aneurysm. Both lungs are clear.  Loop recording device projects over the left heart border. The visualized skeletal structures are unremarkable. IMPRESSION: Aortic atherosclerosis without aneurysm. No active pulmonary disease. Electronically Signed   By: Ashley Royalty [name]   On: 01/19/2018 22:08     Procedures   ____________________________________________   INITIAL IMPRESSION / ASSESSMENT AND PLAN / ED COURSE  As part of my medical decision making, I reviewed the following data within the electronic MEDICAL RECORD NUMBER   77 year old female presenting with above-stated history and physical exam secondary to chest pain.  Consider the possibility of CAD/ACS and as such EKG was performed which revealed no evidence of ischemia or infarction.  Laboratory data notable for negative troponin.  Also consider the possibility of pulmonary emboli and as such d-dimer was performed which is negative.  Patient has ongoing chest pain and as such patient discussed with Dr. Marcille Blanco for hospital admission further evaluation and management. ____________________________________________  FINAL CLINICAL IMPRESSION(S) / ED DIAGNOSES  Final diagnoses:  Chest pain, unspecified type     MEDICATIONS GIVEN DURING THIS VISIT:  Medications  aspirin chewable tablet 324 mg (has no administration in time range)     ED Discharge Orders    None       Note:  This document was prepared using Dragon voice recognition software and may include unintentional dictation errors.    Gregor Hams, MD 01/20/18 [phone]

## 2018-01-19 NOTE — ED Notes (Signed)
Pt  Reports intermittent chest pain today. Pt took 2 ntg with some pain relief.  States ntg was out of date however.  No n/v/d   No diaphoresis.  Iv in place.  Pt alert.  Family with pt.

## 2018-01-19 NOTE — ED Triage Notes (Signed)
Pt in with co chest pain that started this am, does have have a hx of heart disease but did take 2 nitro with some relief.

## 2018-01-20 DIAGNOSIS — Z7951 Long term (current) use of inhaled steroids: Secondary | ICD-10-CM | POA: Diagnosis not present

## 2018-01-20 DIAGNOSIS — Z888 Allergy status to other drugs, medicaments and biological substances status: Secondary | ICD-10-CM | POA: Diagnosis not present

## 2018-01-20 DIAGNOSIS — F419 Anxiety disorder, unspecified: Secondary | ICD-10-CM | POA: Diagnosis present

## 2018-01-20 DIAGNOSIS — N179 Acute kidney failure, unspecified: Secondary | ICD-10-CM | POA: Diagnosis present

## 2018-01-20 DIAGNOSIS — I10 Essential (primary) hypertension: Secondary | ICD-10-CM | POA: Diagnosis present

## 2018-01-20 DIAGNOSIS — Z7902 Long term (current) use of antithrombotics/antiplatelets: Secondary | ICD-10-CM | POA: Diagnosis not present

## 2018-01-20 DIAGNOSIS — Z8711 Personal history of peptic ulcer disease: Secondary | ICD-10-CM | POA: Diagnosis not present

## 2018-01-20 DIAGNOSIS — Z7984 Long term (current) use of oral hypoglycemic drugs: Secondary | ICD-10-CM | POA: Diagnosis not present

## 2018-01-20 DIAGNOSIS — R079 Chest pain, unspecified: Secondary | ICD-10-CM | POA: Diagnosis present

## 2018-01-20 DIAGNOSIS — E039 Hypothyroidism, unspecified: Secondary | ICD-10-CM | POA: Diagnosis present

## 2018-01-20 DIAGNOSIS — E860 Dehydration: Secondary | ICD-10-CM | POA: Diagnosis present

## 2018-01-20 DIAGNOSIS — E1151 Type 2 diabetes mellitus with diabetic peripheral angiopathy without gangrene: Secondary | ICD-10-CM | POA: Diagnosis present

## 2018-01-20 DIAGNOSIS — Z79899 Other long term (current) drug therapy: Secondary | ICD-10-CM | POA: Diagnosis not present

## 2018-01-20 DIAGNOSIS — E876 Hypokalemia: Secondary | ICD-10-CM | POA: Diagnosis present

## 2018-01-20 DIAGNOSIS — E782 Mixed hyperlipidemia: Secondary | ICD-10-CM | POA: Diagnosis present

## 2018-01-20 DIAGNOSIS — Z882 Allergy status to sulfonamides status: Secondary | ICD-10-CM | POA: Diagnosis not present

## 2018-01-20 DIAGNOSIS — R0789 Other chest pain: Secondary | ICD-10-CM | POA: Diagnosis present

## 2018-01-20 DIAGNOSIS — Z8673 Personal history of transient ischemic attack (TIA), and cerebral infarction without residual deficits: Secondary | ICD-10-CM | POA: Diagnosis not present

## 2018-01-20 DIAGNOSIS — Z7982 Long term (current) use of aspirin: Secondary | ICD-10-CM | POA: Diagnosis not present

## 2018-01-20 LAB — TROPONIN I
Troponin I: <0.03 ng/mL (ref <0.03)
Troponin I: <0.03 ng/mL (ref <0.03)
Troponin I: <0.03 ng/mL (ref <0.03)

## 2018-01-20 LAB — TSH: TSH: 11.02 u[IU]/mL — ABNORMAL HIGH (ref 0.350–4.500)

## 2018-01-20 LAB — FIBRIN DERIVATIVES D-DIMER (ARMC ONLY): Fibrin derivatives D-dimer (ARMC): 480.54 ng/mL (FEU) (ref 0.00–499.00)

## 2018-01-20 LAB — GLUCOSE, CAPILLARY
Glucose-Capillary: 99 mg/dL (ref 70–99)
Glucose-Capillary: 104 mg/dL — ABNORMAL HIGH (ref 70–99)
Glucose-Capillary: 95 mg/dL (ref 70–99)
Glucose-Capillary: 139 mg/dL — ABNORMAL HIGH (ref 70–99)

## 2018-01-20 MED ORDER — FLUTICASONE FUROATE-VILANTEROL 200-25 MCG/INH IN AEPB
1.0000 | INHALATION_SPRAY | Freq: Every day | RESPIRATORY_TRACT | Status: DC
  Administered 2018-01-20: 1 via RESPIRATORY_TRACT
  Filled 2018-01-20: qty 28

## 2018-01-20 MED ORDER — ATORVASTATIN CALCIUM 20 MG PO TABS
40.0000 mg | ORAL_TABLET | Freq: Every day | ORAL | Status: DC
  Administered 2018-01-20: 40 mg via ORAL
  Filled 2018-01-20: qty 2

## 2018-01-20 MED ORDER — ACETAMINOPHEN 325 MG PO TABS: 650.0000 mg | ORAL_TABLET | Freq: Four times a day (QID) | ORAL | Status: DC | PRN

## 2018-01-20 MED ORDER — INSULIN ASPART 100 UNIT/ML ~~LOC~~ SOLN: 0.0000 [IU] | Freq: Every day | SUBCUTANEOUS | Status: DC

## 2018-01-20 MED ORDER — CLOPIDOGREL BISULFATE 75 MG PO TABS
75.0000 mg | ORAL_TABLET | Freq: Every day | ORAL | Status: DC
  Administered 2018-01-20 – 2018-01-21 (×2): 75 mg via ORAL
  Filled 2018-01-20 (×2): qty 1

## 2018-01-20 MED ORDER — TRIAMTERENE-HCTZ 37.5-25 MG PO TABS
1.0000 | ORAL_TABLET | Freq: Every day | ORAL | Status: DC
  Administered 2018-01-20 – 2018-01-21 (×2): 1 via ORAL
  Filled 2018-01-20 (×2): qty 1

## 2018-01-20 MED ORDER — ONDANSETRON HCL 4 MG/2ML IJ SOLN: 4.0000 mg | Freq: Four times a day (QID) | INTRAMUSCULAR | Status: DC | PRN

## 2018-01-20 MED ORDER — ADULT MULTIVITAMIN W/MINERALS CH
1.0000 | ORAL_TABLET | Freq: Every day | ORAL | Status: DC
  Administered 2018-01-20: 1 via ORAL
  Filled 2018-01-20: qty 1

## 2018-01-20 MED ORDER — NITROGLYCERIN 0.4 MG SL SUBL: 0.4000 mg | SUBLINGUAL_TABLET | SUBLINGUAL | Status: DC | PRN

## 2018-01-20 MED ORDER — LABETALOL HCL 5 MG/ML IV SOLN: 5.0000 mg | INTRAVENOUS | Status: DC | PRN

## 2018-01-20 MED ORDER — POTASSIUM CHLORIDE 20 MEQ PO PACK
40.0000 meq | PACK | Freq: Once | ORAL | Status: AC
  Administered 2018-01-20: 40 meq via ORAL
  Filled 2018-01-20: qty 2

## 2018-01-20 MED ORDER — HEPARIN SODIUM (PORCINE) 5000 UNIT/ML IJ SOLN
5000.0000 [IU] | Freq: Three times a day (TID) | INTRAMUSCULAR | Status: DC
  Administered 2018-01-20 – 2018-01-21 (×3): 5000 [IU] via SUBCUTANEOUS
  Filled 2018-01-20 (×3): qty 1

## 2018-01-20 MED ORDER — AZELASTINE HCL 0.1 % NA SOLN
2.0000 | Freq: Every day | NASAL | Status: DC
  Administered 2018-01-20: 2 via NASAL
  Filled 2018-01-20: qty 30

## 2018-01-20 MED ORDER — ACETAMINOPHEN 650 MG RE SUPP: 650.0000 mg | Freq: Four times a day (QID) | RECTAL | Status: DC | PRN

## 2018-01-20 MED ORDER — ONDANSETRON HCL 4 MG PO TABS: 4.0000 mg | ORAL_TABLET | Freq: Four times a day (QID) | ORAL | Status: DC | PRN

## 2018-01-20 MED ORDER — INSULIN ASPART 100 UNIT/ML ~~LOC~~ SOLN: 0.0000 [IU] | Freq: Three times a day (TID) | SUBCUTANEOUS | Status: DC

## 2018-01-20 MED ORDER — LEVOTHYROXINE SODIUM 50 MCG PO TABS: 50.0000 ug | ORAL_TABLET | Freq: Every day | ORAL | Status: DC

## 2018-01-20 MED ORDER — DOCUSATE SODIUM 100 MG PO CAPS
100.0000 mg | ORAL_CAPSULE | Freq: Two times a day (BID) | ORAL | Status: DC
  Administered 2018-01-20 (×2): 100 mg via ORAL
  Filled 2018-01-20 (×3): qty 1

## 2018-01-20 MED ORDER — ASPIRIN EC 81 MG PO TBEC
81.0000 mg | DELAYED_RELEASE_TABLET | ORAL | Status: DC
  Administered 2018-01-20 – 2018-01-21 (×2): 81 mg via ORAL
  Filled 2018-01-20 (×2): qty 1

## 2018-01-20 MED ORDER — MAGNESIUM OXIDE 400 (241.3 MG) MG PO TABS
200.0000 mg | ORAL_TABLET | Freq: Every day | ORAL | Status: DC
  Administered 2018-01-20: 200 mg via ORAL
  Filled 2018-01-20: qty 1

## 2018-01-20 NOTE — ED Notes (Signed)
Pt alert.  nsr on monitor.  Family with pt.

## 2018-01-20 NOTE — ED Notes (Signed)
Patient transported to 251

## 2018-01-20 NOTE — ED Notes (Signed)
Pt continues to have pressure in the chest   md aware.  nsr on moniotr.  Pt alert  Family with pt

## 2018-01-20 NOTE — Progress Notes (Signed)
[place] [facility] [place] NAME: Destiny Gilbert    MR#:  [id number]  DATE OF BIRTH:  [birth year]  SUBJECTIVE:   Patient presented to the hospital with chest pain.  Pain has now resolved.  Patient denies any other associated symptoms with the chest pain like shortness of breath, diaphoresis palpitations.  Plan for nuclear medicine stress test tomorrow.  Cardiac markers so far have been negative.  EKG shows no acute ST or T wave changes.  REVIEW OF SYSTEMS:    Review of Systems  Constitutional: Negative for chills and fever.  HENT: Negative for congestion and tinnitus.   Eyes: Negative for blurred vision and double vision.  Respiratory: Negative for cough, shortness of breath and wheezing.   Cardiovascular: Negative for chest pain, orthopnea and PND.  Gastrointestinal: Negative for abdominal pain, diarrhea, nausea and vomiting.  Genitourinary: Negative for dysuria and hematuria.  Neurological: Negative for dizziness, sensory change and focal weakness.  All other systems reviewed and are negative.   Nutrition: Heart Healthy/Carb control Tolerating Diet: Yes Tolerating PT: Ambulatory   DRUG ALLERGIES:   Allergies  Allergen Reactions  . Dipyridamole Nausea Only    Headache  . Sulfa Antibiotics Nausea And Vomiting    VITALS:  Blood pressure (!) 152/83, pulse 64, temperature 97.8 F (36.6 C), temperature source Oral, resp. rate 18, height 5\' 2"  (1.575 m), weight 76 kg (167 lb 8 oz), SpO2 96 %.  PHYSICAL EXAMINATION:   Physical Exam  GENERAL:  77 y.o.-year-old patient lying in bed in no acute distress.  EYES: Pupils equal, round, reactive to light and accommodation. No scleral icterus. Extraocular muscles intact.  HEENT: Head atraumatic, normocephalic. Oropharynx and nasopharynx clear.  NECK:  Supple, no jugular venous distention. No thyroid enlargement, no tenderness.  LUNGS: Normal breath sounds bilaterally, no wheezing, rales, rhonchi. No  use of accessory muscles of respiration.  CARDIOVASCULAR: S1, S2 normal. No murmurs, rubs, or gallops.  ABDOMEN: Soft, nontender, nondistended. Bowel sounds present. No organomegaly or mass.  EXTREMITIES: No cyanosis, clubbing or edema b/l.    NEUROLOGIC: Cranial nerves II through XII are intact. No focal Motor or sensory deficits b/l.   PSYCHIATRIC: The patient is alert and oriented x 3.  SKIN: No obvious rash, lesion, or ulcer.    LABORATORY PANEL:   CBC Recent Labs  Lab 01/19/18 2137  WBC 8.8  HGB 13.5  HCT 39.9  PLT 300   ------------------------------------------------------------------------------------------------------------------  Chemistries  Recent Labs  Lab 01/19/18 2137  NA 138  K 3.2*  CL 103  CO2 27  GLUCOSE 119*  BUN 26*  CREATININE 1.30*  CALCIUM 9.4  AST 31  ALT 19  ALKPHOS 64  BILITOT 0.6   ------------------------------------------------------------------------------------------------------------------  Cardiac Enzymes Recent Labs  Lab 01/20/18 1033  TROPONINI <0.03   ------------------------------------------------------------------------------------------------------------------  RADIOLOGY:  Dg Chest 2 View  Result Date: 01/19/2018 CLINICAL DATA:  Chest pain with some relief after nitroglycerin. EXAM: CHEST - 2 VIEW COMPARISON:  None. FINDINGS: The heart size and mediastinal contours are within normal limits. Moderate aortic atherosclerosis at the arch without aneurysm. Both lungs are clear. Loop recording device projects over the left heart border. The visualized skeletal structures are unremarkable. IMPRESSION: Aortic atherosclerosis without aneurysm. No active pulmonary disease. Electronically Signed   By: Ashley Royalty [name]   On: 01/19/2018 22:08     ASSESSMENT AND PLAN:   77 year old female with past medical history of essential hypertension, diabetes, history of peptic ulcer disease who  presented to the hospital due to chest  pain.  1.  Chest pain- patient symptoms are somewhat atypical for angina. - Her cardiac markers x3 have been negative, she is clinically asymptomatic now.  Seen by cardiology and plan for nuclear medicine stress test tomorrow. -Continue aspirin, Plavix, Nitro  2.  Essential hypertension-continue triamterene/HCTZ, PRN labetalol.  Blood pressure stable.  3.  Hypokalemia-will replace potassium orally.  Repeat level in the morning.  4.  Hypothyroidism-continue Synthroid.  5.  Diabetes type 2 without complication-continue sliding scale insulin.  6. Hyperlipidemia - cont. Atorvastatin    All the records are reviewed and case discussed with Care Management/Social Worker. Management plans discussed with the patient, family and they are in agreement.  CODE STATUS: Full code  DVT Prophylaxis: Hep SQ  TOTAL TIME TAKING CARE OF THIS PATIENT: 30 minutes.   POSSIBLE D/C IN 1-2 DAYS, DEPENDING ON CLINICAL CONDITION.   Henreitta Leber M.D on 01/20/2018 at 3:14 PM  Between 7am to 6pm - Pager - [phone]  After 6pm go to [email or web address] - Proofreader  [facility]  Office  [phone]  CC: Primary care physician; Birdie Sons, MD

## 2018-01-20 NOTE — ED Notes (Signed)
Report off to andrea rn  

## 2018-01-20 NOTE — H&P (Signed)
Destiny Gilbert is an 77 y.o. female.   Chief Complaint: Chest pain HPI: The patient with past medical history of diabetes and hyperlipidemia presents to the emergency department complaining of chest pain.  The pain began this morning after the patient awoke and was mild but lingering.  It worsened later in the day and seemed to radiate from left to right across the patient's chest.  She denies nausea, vomiting or diaphoresis.  She admits to some shortness of breath.  Cardiac biomarkers remain negative but due to ongoing chest discomfort the emergency department staff called the hospitalist service for further evaluation.  Past Medical History:  Diagnosis Date  . Diabetes mellitus without complication ([place])   . History of peptic ulcer    perforated  . Hyperlipidemia   . Pneumonia 01/23/2015   [facility]     Past Surgical History:  Procedure Laterality Date  . ABDOMINAL HYSTERECTOMY  1994   BSO  . CATARACT EXTRACTION     left eye- 09/2014; righr eye- 08/13/2014  . COLONOSCOPY WITH PROPOFOL N/A 12/23/2016   Procedure: COLONOSCOPY WITH PROPOFOL;  Surgeon: Lollie Sails, MD;  Location: [facility] ENDOSCOPY;  Service: Endoscopy;  Laterality: N/A;  . INSERTION OF CARDIAC MONITOR    . LOOP RECORDER INSERTION N/A 07/29/2017   Procedure: LOOP RECORDER INSERTION;  Surgeon: Isaias Cowman, MD;  Location: [place] CV LAB;  Service: Cardiovascular;  Laterality: N/A;    Family History  Problem Relation Age of Onset  . Lung cancer Mother   . Pancreatic cancer Father   . Hypertension Sister   . Hyperlipidemia Sister   . Breast cancer Neg Hx    Social History:  reports that she has never smoked. She has never used smokeless tobacco. She reports that she drinks about 2.4 oz of alcohol per week. She reports that she does not use drugs.  Allergies:  Allergies  Allergen Reactions  . Dipyridamole Nausea Only    Headache  . Sulfa Antibiotics Nausea And Vomiting    Medications Prior to Admission   Medication Sig Dispense Refill  . acetaminophen (TYLENOL) 500 MG tablet Take 500 mg by mouth every 6 (six) hours as needed.    Marland Kitchen albuterol (PROAIR HFA) 108 (90 BASE) MCG/ACT inhaler Inhale 2 puffs into the lungs every 6 (six) hours as needed.    Marland Kitchen aspirin EC 81 MG tablet Take 81 mg by mouth every Monday, Wednesday, and Friday.     Marland Kitchen atorvastatin (LIPITOR) 40 MG tablet Take 1 tablet (40 mg total) by mouth daily at 6 PM. 30 tablet 11  . azelastine (ASTELIN) 0.1 % nasal spray Place 0.3 mLs into both nostrils daily.  12  . clopidogrel (PLAVIX) 75 MG tablet Take 75 mg by mouth daily.    . Fluticasone-Salmeterol (ADVAIR) 250-50 MCG/DOSE AEPB INHALE 1 DOSE BY MOUTH TWICE DAILY 60 each 12  . Glucosamine-Chondroit-Vit C-Mn (GLUCOSAMINE CHONDR 1500 COMPLX) CAPS Take 3-4 capsules by mouth daily. Takes 3-4 capsules daily     . MAGNESIUM PO Take 250 mg by mouth at bedtime.    . metFORMIN (GLUCOPHAGE-XR) 500 MG 24 hr tablet TAKE ONE TABLET BY MOUTH ONCE DAILY FOR  BLOOD  SUGAR 30 tablet 12  . MULTIPLE VITAMIN PO Take 1 tablet by mouth daily.    . nitroGLYCERIN (NITROSTAT) 0.3 MG SL tablet Place 1 tablet (0.3 mg total) under the tongue every 5 (five) minutes as needed for chest pain. 90 tablet 0  . triamterene-hydrochlorothiazide (MAXZIDE-25) 37.5-25 MG tablet TAKE ONE  TABLET BY MOUTH ONCE DAILY 90 tablet 4  . glucose blood (ONE TOUCH ULTRA TEST) test strip Use to check blood sugar daily 100 each 12  . hydrocortisone (PROCTOCARE-HC) 2.5 % rectal cream Place 1 application rectally 2 (two) times daily. (Patient not taking: Reported on 01/19/2018) 30 g 0    Results for orders placed or performed during the hospital encounter of 01/19/18 (from the past 48 hour(s))  CBC     Status: None   Collection Time: 01/19/18  9:37 PM  Result Value Ref Range   WBC 8.8 3.6 - 11.0 K/uL   RBC 4.17 3.80 - 5.20 MIL/uL   Hemoglobin 13.5 12.0 - 16.0 g/dL   HCT 39.9 35.0 - 47.0 %   MCV 95.6 80.0 - 100.0 fL   MCH 32.4 26.0 -  34.0 pg   MCHC 33.9 32.0 - 36.0 g/dL   RDW 13.5 11.5 - 14.5 %   Platelets 300 150 - 440 K/uL    Comment: Performed at [facility], [address], [place], [place] [address]  Comprehensive metabolic panel     Status: Abnormal   Collection Time: 01/19/18  9:37 PM  Result Value Ref Range   Sodium 138 135 - 145 mmol/L   Potassium 3.2 (L) 3.5 - 5.1 mmol/L   Chloride 103 98 - 111 mmol/L    Comment: Please note change in reference range.   CO2 27 22 - 32 mmol/L   Glucose, Bld 119 (H) 70 - 99 mg/dL    Comment: Please note change in reference range.   BUN 26 (H) 8 - 23 mg/dL    Comment: Please note change in reference range.   Creatinine, Ser 1.30 (H) 0.44 - 1.00 mg/dL   Calcium 9.4 8.9 - 10.3 mg/dL   Total Protein 7.6 6.5 - 8.1 g/dL   Albumin 4.0 3.5 - 5.0 g/dL   AST 31 15 - 41 U/L   ALT 19 0 - 44 U/L    Comment: Please note change in reference range.   Alkaline Phosphatase 64 38 - 126 U/L   Total Bilirubin 0.6 0.3 - 1.2 mg/dL   GFR calc non Af Amer 39 (L) >60 mL/min   GFR calc Af Amer 45 (L) >60 mL/min    Comment: (NOTE) The eGFR has been calculated using the CKD EPI equation. This calculation has not been validated in all clinical situations. eGFR's persistently <60 mL/min signify possible Chronic Kidney Disease.    Anion gap 8 5 - 15    Comment: Performed at [facility], [address] [place], [place] [address]  Troponin I     Status: None   Collection Time: 01/19/18  9:37 PM  Result Value Ref Range   Troponin I <0.03 <0.03 ng/mL    Comment: Performed at [facility], [address] [place], [place] [address]  Fibrin derivatives D-Dimer     Status: None   Collection Time: 01/19/18  9:37 PM  Result Value Ref Range   Fibrin derivatives D-dimer ([facility]) 480.54 0.00 - 499.00 ng/mL (FEU)    Comment: (NOTE) <> Exclusion of Venous Thromboembolism (VTE) - OUTPATIENT ONLY   (Emergency Department or [place])   0-499 ng/ml (FEU): With a low to  intermediate pretest probability                      for VTE this test result excludes the diagnosis  of VTE.   >499 ng/ml (FEU) : VTE not excluded; additional work up for VTE is                      required. <> Testing on Inpatients and Evaluation of Disseminated Intravascular   Coagulation (DIC) Reference Range:   0-499 ng/ml (FEU) Performed at [facility], [address] [place], [place] [address]   Troponin I     Status: None   Collection Time: 01/20/18 12:42 AM  Result Value Ref Range   Troponin I <0.03 <0.03 ng/mL    Comment: Performed at [facility], [address] [place], [place] [address]  TSH     Status: Abnormal   Collection Time: 01/20/18  5:16 AM  Result Value Ref Range   TSH 11.020 (H) 0.350 - 4.500 uIU/mL    Comment: Performed by a 3rd Generation assay with a functional sensitivity of <=0.01 uIU/mL. Performed at [facility], [address] [place], [place] [address]   Troponin I     Status: None   Collection Time: 01/20/18  5:16 AM  Result Value Ref Range   Troponin I <0.03 <0.03 ng/mL    Comment: Performed at [facility], [address] [place],  [address]   Dg Chest 2 View  Result Date: 01/19/2018 CLINICAL DATA:  Chest pain with some relief after nitroglycerin. EXAM: CHEST - 2 VIEW COMPARISON:  None. FINDINGS: The heart size and mediastinal contours are within normal limits. Moderate aortic atherosclerosis at the arch without aneurysm. Both lungs are clear. Loop recording device projects over the left heart border. The visualized skeletal structures are unremarkable. IMPRESSION: Aortic atherosclerosis without aneurysm. No active pulmonary disease. Electronically Signed   By: Ashley Royalty [name]   On: 01/19/2018 22:08    Review of Systems  Constitutional: Negative for chills and fever.  HENT: Negative for sore throat and tinnitus.   Eyes: Negative for blurred vision and redness.   Respiratory: Negative for cough and shortness of breath.   Cardiovascular: Positive for chest pain. Negative for palpitations, orthopnea and PND.  Gastrointestinal: Negative for abdominal pain, diarrhea, nausea and vomiting.  Genitourinary: Negative for dysuria, frequency and urgency.  Musculoskeletal: Negative for joint pain and myalgias.  Skin: Negative for rash.       No lesions  Neurological: Negative for speech change, focal weakness and weakness.  Endo/Heme/Allergies: Does not bruise/bleed easily.       No temperature intolerance  Psychiatric/Behavioral: Negative for depression and suicidal ideas.    Blood pressure (!) 185/69, pulse 60, temperature 97.8 F (36.6 C), temperature source Oral, resp. rate 18, height '5\' 2"'$  (1.575 m), weight 76 kg (167 lb 8 oz), SpO2 99 %. Physical Exam  Vitals reviewed. Constitutional: She is oriented to person, place, and time. She appears well-developed and well-nourished.  HENT:  Head: Normocephalic and atraumatic.  Mouth/Throat: Oropharynx is clear and moist.  Eyes: Pupils are equal, round, and reactive to light. Conjunctivae and EOM are normal. No scleral icterus.  Neck: Normal range of motion. Neck supple. No JVD present. No tracheal deviation present. No thyromegaly present.  Cardiovascular: Normal rate, regular rhythm and normal heart sounds. Exam reveals no gallop and no friction rub.  No murmur heard. Respiratory: Effort normal and breath sounds normal.  GI: Soft. Bowel sounds are normal. She exhibits no distension. There is no tenderness.  Genitourinary:  Genitourinary Comments: Deferred  Musculoskeletal: Normal range of motion. She exhibits no edema.  Lymphadenopathy:    She has no cervical adenopathy.  Neurological: She is alert and oriented to person, place, and time. No cranial nerve deficit. She exhibits normal muscle tone.  Skin: Skin is warm and dry. No rash noted. No erythema.  Psychiatric: She has a normal mood and affect. Her  behavior is normal. Judgment and thought content normal.     Assessment/Plan This is a 77 year old female admitted for chest pain. 1.  Chest pain: Atypical; some features strongly suggestive of anxiety.  Continue to follow cardiac biomarkers.  Monitor telemetry.  Consult cardiology. 2.  Acute kidney injury: The patient admits to not drinking much yesterday and feeling very hot throughout the day.  (Environmental temperature also very hot).  Hydrate with intravenous fluid.  Avoid nephrotoxic agents. 3.  Diabetes mellitus type 2: Hold metformin; sliding scale insulin while hospitalized 4.  Hypothyroidism: TSH significant the elevated.  Start Synthroid 5.  DVT prophylaxis: Heparin 6.  GI prophylaxis: None The patient is a full code.  Time spent on admission orders and patient care approximately 45 minutes  Harrie Foreman, MD 01/20/2018, 6:33 AM

## 2018-01-20 NOTE — Consult Note (Signed)
[facility] Consultation Note  Patient ID: Destiny Gilbert, MRN: [id number], DOB/AGE: 75/08/1940 75 y.o. Admit date: 01/19/2018   Date of Consult: 01/20/2018 Primary Physician: Birdie Sons, MD Primary Cardiologist: Raynelle Chary  Chief Complaint:  Chief Complaint  Patient presents with  . Chest Pain   Reason for Consult: Chest pain  HPI: 76 y.o. female with known diabetes essential hypertension mixed hyperlipidemia with peripheral vascular disease and previous stroke and no current origin of her stroke other than vascular disease.  The patient has been on appropriate medication management at this time and has done fairly well but is not very active.  Yesterday she has had new onset chest pressure radiating into her left upper chest and shoulder and then staying constant for approximately 12+ hours and then radiating into the center of her chest again.  This was associated with mild shortness of breath but no other significant symptoms and still is slightly constant in her chest at this time.  The patient has had an EKG showing normal sinus rhythm with nonspecific ST changes and a troponin level which was normal.  The patient has been on appropriate medication for cardiovascular risk reduction and doing fairly well with this.  Currently she is comfortable.  Past Medical History:  Diagnosis Date  . Diabetes mellitus without complication ([place])   . History of peptic ulcer    perforated  . Hyperlipidemia   . Pneumonia 01/23/2015   [facility]       Surgical History:  Past Surgical History:  Procedure Laterality Date  . ABDOMINAL HYSTERECTOMY  1994   BSO  . CATARACT EXTRACTION     left eye- 09/2014; righr eye- 08/13/2014  . COLONOSCOPY WITH PROPOFOL N/A 12/23/2016   Procedure: COLONOSCOPY WITH PROPOFOL;  Surgeon: Lollie Sails, MD;  Location: [facility] ENDOSCOPY;  Service: Endoscopy;  Laterality: N/A;  . INSERTION OF CARDIAC MONITOR    . LOOP RECORDER INSERTION N/A 07/29/2017   Procedure: LOOP RECORDER INSERTION;  Surgeon: Isaias Cowman, MD;  Location: [place] CV LAB;  Service: Cardiovascular;  Laterality: N/A;     Home Meds: Prior to Admission medications   Medication Sig Start Date End Date Taking? Authorizing Provider  acetaminophen (TYLENOL) 500 MG tablet Take 500 mg by mouth every 6 (six) hours as needed.   Yes [provider]  albuterol (PROAIR HFA) 108 (90 BASE) MCG/ACT inhaler Inhale 2 puffs into the lungs every 6 (six) hours as needed. 10/01/12  Yes [provider]  aspirin EC 81 MG tablet Take 81 mg by mouth every Monday, Wednesday, and Friday.    Yes [provider]  atorvastatin (LIPITOR) 40 MG tablet Take 1 tablet (40 mg total) by mouth daily at 6 PM. 03/06/17  Yes Fisher, Kirstie Peri, MD  azelastine (ASTELIN) 0.1 % nasal spray Place 0.3 mLs into both nostrils daily. 09/28/17  Yes [provider]  clopidogrel (PLAVIX) 75 MG tablet Take 75 mg by mouth daily.   Yes [provider]  Fluticasone-Salmeterol (ADVAIR) 250-50 MCG/DOSE AEPB INHALE 1 DOSE BY MOUTH TWICE DAILY 10/16/17  Yes Birdie Sons, MD  Glucosamine-Chondroit-Vit C-Mn (GLUCOSAMINE CHONDR 1500 COMPLX) CAPS Take 3-4 capsules by mouth daily. Takes 3-4 capsules daily    Yes [provider]  MAGNESIUM PO Take 250 mg by mouth at bedtime.   Yes [provider]  metFORMIN (GLUCOPHAGE-XR) 500 MG 24 hr tablet TAKE ONE TABLET BY MOUTH ONCE DAILY FOR  BLOOD  SUGAR 03/16/17  Yes Lelon Huh  E, MD  MULTIPLE VITAMIN PO Take 1 tablet by mouth daily. 04/12/08  Yes [provider]  nitroGLYCERIN (NITROSTAT) 0.3 MG SL tablet Place 1 tablet (0.3 mg total) under the tongue every 5 (five) minutes as needed for chest pain. 01/06/17  Yes Henreitta Leber, MD  triamterene-hydrochlorothiazide (MAXZIDE-25) 37.5-25 MG tablet TAKE ONE TABLET BY MOUTH ONCE DAILY 02/23/17  Yes Birdie Sons, MD  glucose blood (ONE TOUCH ULTRA TEST) test strip Use  to check blood sugar daily 12/28/15   Birdie Sons, MD  hydrocortisone (PROCTOCARE-HC) 2.5 % rectal cream Place 1 application rectally 2 (two) times daily. Patient not taking: Reported on 01/19/2018 10/01/17   Birdie Sons, MD    Inpatient Medications:  . aspirin EC  81 mg Oral Q M,W,F  . atorvastatin  40 mg Oral q1800  . azelastine  2 spray Each Nare Daily  . clopidogrel  75 mg Oral Daily  . docusate sodium  100 mg Oral BID  . fluticasone furoate-vilanterol  1 puff Inhalation Daily  . heparin  5,000 Units Subcutaneous Q8H  . insulin aspart  0-5 Units Subcutaneous QHS  . insulin aspart  0-9 Units Subcutaneous TID WC  . magnesium oxide  200 mg Oral QHS  . multivitamin with minerals  1 tablet Oral Daily  . triamterene-hydrochlorothiazide  1 tablet Oral Daily     Allergies:  Allergies  Allergen Reactions  . Dipyridamole Nausea Only    Headache  . Sulfa Antibiotics Nausea And Vomiting    Social History   Socioeconomic History  . Marital status: Married    Spouse name: Not on file  . Number of children: 3  . Years of education: Not on file  . Highest education level: Some college, no degree  Occupational History  . Occupation: Retired  Scientific laboratory technician  . Financial resource strain: Not hard at all  . Food insecurity:    Worry: Never true    Inability: Never true  . Transportation needs:    Medical: No    Non-medical: No  Tobacco Use  . Smoking status: Never Smoker  . Smokeless tobacco: Never Used  Substance and Sexual Activity  . Alcohol use: Yes    Alcohol/week: 2.4 oz    Types: 4 Glasses of wine per week  . Drug use: No  . Sexual activity: Not on file  Lifestyle  . Physical activity:    Days per week: Not on file    Minutes per session: Not on file  . Stress: Not at all  Relationships  . Social connections:    Talks on phone: Not on file    Gets together: Not on file    Attends religious service: Not on file    Active member of club or organization: Not  on file    Attends meetings of clubs or organizations: Not on file    Relationship status: Not on file  . Intimate partner violence:    Fear of current or ex partner: Not on file    Emotionally abused: Not on file    Physically abused: Not on file    Forced sexual activity: Not on file  Other Topics Concern  . Not on file  Social History Narrative  . Not on file     Family History  Problem Relation Age of Onset  . Lung cancer Mother   . Pancreatic cancer Father   . Hypertension Sister   . Hyperlipidemia Sister   . Breast cancer Neg  Hx      Review of Systems Positive for chest pain Negative for: General:  chills, fever, night sweats or weight changes.  Cardiovascular: PND orthopnea syncope dizziness  Dermatological skin lesions rashes Respiratory: Cough congestion Urologic: Frequent urination urination at night and hematuria Abdominal: negative for nausea, vomiting, diarrhea, bright red blood per rectum, melena, or hematemesis Neurologic: negative for visual changes, and/or hearing changes  All other systems reviewed and are otherwise negative except as noted above.  Labs: Recent Labs    01/19/18 2137 01/20/18 0042 01/20/18 0516  TROPONINI <0.03 <0.03 <0.03   Lab Results  Component Value Date   WBC 8.8 01/19/2018   HGB 13.5 01/19/2018   HCT 39.9 01/19/2018   MCV 95.6 01/19/2018   PLT 300 01/19/2018    Recent Labs  Lab 01/19/18 2137  NA 138  K 3.2*  CL 103  CO2 27  BUN 26*  CREATININE 1.30*  CALCIUM 9.4  PROT 7.6  BILITOT 0.6  ALKPHOS 64  ALT 19  AST 31  GLUCOSE 119*   Lab Results  Component Value Date   CHOL 190 01/04/2018   HDL 69 01/04/2018   LDLCALC 98 01/04/2018   TRIG 113 01/04/2018   No results found for: DDIMER  Radiology/Studies:  Dg Chest 2 View  Result Date: 01/19/2018 CLINICAL DATA:  Chest pain with some relief after nitroglycerin. EXAM: CHEST - 2 VIEW COMPARISON:  None. FINDINGS: The heart size and mediastinal contours are  within normal limits. Moderate aortic atherosclerosis at the arch without aneurysm. Both lungs are clear. Loop recording device projects over the left heart border. The visualized skeletal structures are unremarkable. IMPRESSION: Aortic atherosclerosis without aneurysm. No active pulmonary disease. Electronically Signed   By: Ashley Royalty [name]   On: 01/19/2018 22:08    EKG: Sinus rhythm with nonspecific  Weights: Filed Weights   01/19/18 2129 01/20/18 0449  Weight: 165 lb (74.8 kg) 167 lb 8 oz (76 kg)     Physical Exam: Blood pressure (!) 152/83, pulse 64, temperature 97.8 F (36.6 C), temperature source Oral, resp. rate 18, height 5\' 2"  (1.575 m), weight 167 lb 8 oz (76 kg), SpO2 96 %. Body mass index is 30.64 kg/m. General: Well developed, well nourished, in no acute distress. Head eyes ears nose throat: Normocephalic, atraumatic, sclera non-icteric, no xanthomas, nares are without discharge. No apparent thyromegaly and/or mass  Lungs: Normal respiratory effort.  no wheezes, no rales, no rhonchi.  Heart: RRR with normal S1 S2. no murmur gallop, no rub, PMI is normal size and placement, carotid upstroke normal without bruit, jugular venous pressure is normal Abdomen: Soft, non-tender, non-distended with normoactive bowel sounds. No hepatomegaly. No rebound/guarding. No obvious abdominal masses. Abdominal aorta is normal size without bruit Extremities: No edema. no cyanosis, no clubbing, no ulcers  Peripheral : 2+ bilateral upper extremity pulses, 2+ bilateral femoral pulses, 2+ bilateral dorsal pedal pulse Neuro: Alert and oriented. No facial asymmetry. No focal deficit. Moves all extremities spontaneously. Musculoskeletal: Normal muscle tone without kyphosis Psych:  Responds to questions appropriately with a normal affect.    Assessment: 77 year-old female with known vascular hypertension mixed hyperlipidemia diabetes with inactivity having atypical chest discomfort without evidence of  EKG changes and or myocardial infarction  Plan: 1.  Continue medication management for further risk reduction cardiovascular event including high intensity cholesterol therapy antiplatelet therapy and antihypertensives. 2.  Received 2 treadmill stress test for further risk stratification and evaluation 3.  Further treatment options after above  Signed, Corey Skains [name] [facility] 01/20/2018, 9:04 AM

## 2018-01-20 NOTE — Plan of Care (Signed)
  Problem: Clinical Measurements: Goal: Ability to maintain clinical measurements within normal limits will improve Outcome: Progressing Goal: Diagnostic test results will improve Outcome: Progressing Goal: Cardiovascular complication will be avoided Outcome: Progressing   Problem: Cardiac: Goal: Ability to achieve and maintain adequate cardiovascular perfusion will improve Outcome: Progressing

## 2018-01-21 ENCOUNTER — Inpatient Hospital Stay: Payer: PPO

## 2018-01-21 LAB — NM MYOCAR MULTI W/SPECT W/WALL MOTION / EF
CHL CUP NUCLEAR SSS: 9
CHL CUP RESTING HR STRESS: 64 {beats}/min
CSEPEDS: 56 s
CSEPHR: 95 %
CSEPPHR: 139 {beats}/min
Estimated workload: 6.7 METS
Exercise duration (min): 4 min
LV dias vol: 32 mL (ref 46–106)
LVSYSVOL: 7 mL
MPHR: 145 {beats}/min
SDS: 0
SRS: 7
TID: 0.83

## 2018-01-21 LAB — BASIC METABOLIC PANEL
Anion gap: 8 (ref 5–15)
BUN: 20 mg/dL (ref 8–23)
CO2: 28 mmol/L (ref 22–32)
Calcium: 9.2 mg/dL (ref 8.9–10.3)
Chloride: 104 mmol/L (ref 98–111)
Creatinine, Ser: 0.91 mg/dL (ref 0.44–1.00)
GFR calc Af Amer: >60 mL/min (ref >60)
GLUCOSE: 128 mg/dL — AB (ref 70–99)
POTASSIUM: 3.7 mmol/L (ref 3.5–5.1)
Sodium: 140 mmol/L (ref 135–145)

## 2018-01-21 LAB — GLUCOSE, CAPILLARY
Glucose-Capillary: 111 mg/dL — ABNORMAL HIGH (ref 70–99)
Glucose-Capillary: 156 mg/dL — ABNORMAL HIGH (ref 70–99)

## 2018-01-21 MED ORDER — LEVOTHYROXINE SODIUM 50 MCG PO TABS: 50.0000 ug | ORAL_TABLET | Freq: Every day | ORAL | 0 refills | Status: DC

## 2018-01-21 MED ORDER — TECHNETIUM TC 99M TETROFOSMIN IV KIT
32.2800 | PACK | Freq: Once | INTRAVENOUS | Status: AC | PRN
  Administered 2018-01-21: 32.28 via INTRAVENOUS

## 2018-01-21 MED ORDER — TECHNETIUM TC 99M TETROFOSMIN IV KIT
13.5000 | PACK | Freq: Once | INTRAVENOUS | Status: AC | PRN
  Administered 2018-01-21: 13.5 via INTRAVENOUS

## 2018-01-21 NOTE — Progress Notes (Signed)
[facility] Encounter Note  Patient: Destiny Gilbert / Admit Date: 01/19/2018 / Date of Encounter: 01/21/2018, 8:55 AM   Subjective: No further significant chest discomfort or other cardiovascular symptoms.  Patient is hemodynamically stable.  Telemetry shows normal sinus rhythm.  [place] normal without evidence of myocardial infarction.  EKG unchanged  Review of Systems: Positive for: None Negative for: Vision change, hearing change, syncope, dizziness, nausea, vomiting,diarrhea, bloody stool, stomach pain, cough, congestion, diaphoresis, urinary frequency, urinary pain,skin lesions, skin rashes Others previously listed  Objective: Telemetry: Normal sinus rhythm Physical Exam: Blood pressure (!) 129/107, pulse 65, temperature 97.9 F (36.6 C), temperature source Oral, resp. rate 18, height 5\' 2"  (1.575 m), weight 165 lb 12.8 oz (75.2 kg), SpO2 93 %. Body mass index is 30.33 kg/m. General: Well developed, well nourished, in no acute distress. Head: Normocephalic, atraumatic, sclera non-icteric, no xanthomas, nares are without discharge. Neck: No apparent masses Lungs: Normal respirations with no wheezes, no rhonchi, no rales , no crackles   Heart: Regular rate and rhythm, normal S1 S2, no murmur, no rub, no gallop, PMI is normal size and placement, carotid upstroke normal without bruit, jugular venous pressure normal Abdomen: Soft, non-tender, non-distended with normoactive bowel sounds. No hepatosplenomegaly. Abdominal aorta is normal size without bruit Extremities: No edema, no clubbing, no cyanosis, no ulcers,  Peripheral: 2+ radial, 2+ femoral, 2+ dorsal pedal pulses Neuro: Alert and oriented. Moves all extremities spontaneously. Psych:  Responds to questions appropriately with a normal affect.   Intake/Output Summary (Last 24 hours) at 01/21/2018 0855 Last data filed at 01/20/2018 1024 Gross per 24 hour  Intake 360 ml  Output -  Net 360 ml    Inpatient  Medications:  . aspirin EC  81 mg Oral Q M,W,F  . atorvastatin  40 mg Oral q1800  . azelastine  2 spray Each Nare Daily  . clopidogrel  75 mg Oral Daily  . docusate sodium  100 mg Oral BID  . fluticasone furoate-vilanterol  1 puff Inhalation Daily  . heparin  5,000 Units Subcutaneous Q8H  . insulin aspart  0-5 Units Subcutaneous QHS  . insulin aspart  0-9 Units Subcutaneous TID WC  . levothyroxine  50 mcg Oral QAC breakfast  . magnesium oxide  200 mg Oral QHS  . multivitamin with minerals  1 tablet Oral Daily  . triamterene-hydrochlorothiazide  1 tablet Oral Daily   Infusions:   Labs: Recent Labs    01/19/18 2137  NA 138  K 3.2*  CL 103  CO2 27  GLUCOSE 119*  BUN 26*  CREATININE 1.30*  CALCIUM 9.4   Recent Labs    01/19/18 2137  AST 31  ALT 19  ALKPHOS 64  BILITOT 0.6  PROT 7.6  ALBUMIN 4.0   Recent Labs    01/19/18 2137  WBC 8.8  HGB 13.5  HCT 39.9  MCV 95.6  PLT 300   Recent Labs    01/20/18 0042 01/20/18 0516 01/20/18 1033 01/20/18 1806  TROPONINI <0.03 <0.03 <0.03 <0.03   Invalid input(s): POCBNP No results for input(s): HGBA1C in the last 72 hours.   Weights: Filed Weights   01/19/18 2129 01/20/18 0449 01/21/18 0331  Weight: 165 lb (74.8 kg) 167 lb 8 oz (76 kg) 165 lb 12.8 oz (75.2 kg)     Radiology/Studies:  Dg Chest 2 View  Result Date: 01/19/2018 CLINICAL DATA:  Chest pain with some relief after nitroglycerin. EXAM: CHEST - 2 VIEW COMPARISON:  None. FINDINGS: The  heart size and mediastinal contours are within normal limits. Moderate aortic atherosclerosis at the arch without aneurysm. Both lungs are clear. Loop recording device projects over the left heart border. The visualized skeletal structures are unremarkable. IMPRESSION: Aortic atherosclerosis without aneurysm. No active pulmonary disease. Electronically Signed   By: Ashley Royalty [name]   On: 01/19/2018 22:08     Assessment and Recommendation  77 y.o. female with known peripheral  vascular disease status post cerebrovascular accident essential hypertension mixed hyperlipidemia and diabetes with acute onset of chest discomfort atypical in nature without evidence of myocardial infarction or congestive heart failure now improved 1.  Stress test today for further evaluation and risk stratification of chest discomfort 2.  Continuation of antihypertensive medication management as before 3.  No change in diabetes care 4.  High intensity cholesterol therapy 5.  Antiplatelet medication management 6.  Further treatment options based on above  Signed, Serafina Royals [name] FACC

## 2018-01-21 NOTE — Discharge Summary (Signed)
[facility] [place] [facility]   PATIENT NAME: Destiny Gilbert    MR#:  [id number]  DATE OF BIRTH:  1940-09-07  DATE OF ADMISSION:  01/19/2018   ADMITTING PHYSICIAN: Arnaldo Natal, MD  DATE OF DISCHARGE: 01/21/18  PRIMARY CARE PHYSICIAN: Malva Limes, MD   ADMISSION DIAGNOSIS:  Chest pain, unspecified type [R07.9] DISCHARGE DIAGNOSIS:  Active Problems:   Chest pain   AKI (acute kidney injury) (HCC)  SECONDARY DIAGNOSIS:   Past Medical History:  Diagnosis Date  . Diabetes mellitus without complication (HCC)   . History of peptic ulcer    perforated  . Hyperlipidemia   . Pneumonia 01/23/2015   [facility]    HOSPITAL COURSE:  Khyler is a 77 year old female with a PMH of T2DM, HLD presenting to the ED with atypical chest pain and associated shortness of breath. She was admitted for ACS rule out.  1.  Atypical chest pain. May be anxiety component. No active chest pain during her hospitalization. - troponin negative x 3 - cardiology consulted. Stress test performed 7/18 and was negative.  2.  Acute kidney injury- likely due to dehydration in the setting of poor po intake and elevated environmental temperatures. - hydrated with IVFs - Cr decreased from 1.30 on admission to 0.91 on the day of discharge.  3.  Diabetes mellitus type 2: stable - metformin held in hospital and restarted on discharge - SSI while hospitalized  4.  Hypothyroidism: TSH significant the elevated to 11 on admission. - started synthroid daily   DISCHARGE CONDITIONS:  Atypical chest pain, resolved AKI Type 2 diabetes Hypothyroidism CONSULTS OBTAINED:  Treatment Team:  Lamar Blinks, MD DRUG ALLERGIES:   Allergies  Allergen Reactions  . Dipyridamole Nausea Only    Headache  . Sulfa Antibiotics Nausea And Vomiting   DISCHARGE MEDICATIONS:   Allergies as of 01/21/2018      Reactions   Dipyridamole Nausea Only   Headache   Sulfa Antibiotics Nausea And  Vomiting      Medication List    TAKE these medications   acetaminophen 500 MG tablet Commonly known as:  TYLENOL Take 500 mg by mouth every 6 (six) hours as needed.   aspirin EC 81 MG tablet Take 81 mg by mouth every Monday, Wednesday, and Friday.   atorvastatin 40 MG tablet Commonly known as:  LIPITOR Take 1 tablet (40 mg total) by mouth daily at 6 PM.   azelastine 0.1 % nasal spray Commonly known as:  ASTELIN Place 0.3 mLs into both nostrils daily.   clopidogrel 75 MG tablet Commonly known as:  PLAVIX Take 75 mg by mouth daily.   Fluticasone-Salmeterol 250-50 MCG/DOSE Aepb Commonly known as:  ADVAIR INHALE 1 DOSE BY MOUTH TWICE DAILY   GLUCOSAMINE CHONDR 1500 COMPLX Caps Take 3-4 capsules by mouth daily. Takes 3-4 capsules daily   glucose blood test strip Commonly known as:  ONE TOUCH ULTRA TEST Use to check blood sugar daily   hydrocortisone 2.5 % rectal cream Commonly known as:  PROCTOCARE-HC Place 1 application rectally 2 (two) times daily.   levothyroxine 50 MCG tablet Commonly known as:  SYNTHROID, LEVOTHROID Take 1 tablet (50 mcg total) by mouth daily before breakfast. Start taking on:  01/22/2018   MAGNESIUM PO Take 250 mg by mouth at bedtime.   metFORMIN 500 MG 24 hr tablet Commonly known as:  GLUCOPHAGE-XR TAKE ONE TABLET BY MOUTH ONCE DAILY FOR  BLOOD  SUGAR  MULTIPLE VITAMIN PO Take 1 tablet by mouth daily.   nitroGLYCERIN 0.3 MG SL tablet Commonly known as:  NITROSTAT Place 1 tablet (0.3 mg total) under the tongue every 5 (five) minutes as needed for chest pain.   PROAIR HFA 108 (90 Base) MCG/ACT inhaler Generic drug:  albuterol Inhale 2 puffs into the lungs every 6 (six) hours as needed.   triamterene-hydrochlorothiazide 37.5-25 MG tablet Commonly known as:  MAXZIDE-25 TAKE ONE TABLET BY MOUTH ONCE DAILY        DISCHARGE INSTRUCTIONS:  1. F/u with PCP in 1-2 weeks 2. F/u with cardiology in 2 weeks 3. TSH noted to be 11 on  admission. Patient started on Synthroid daily. Recommend rechecking TSH, T4 as outpatient DIET:  Cardiac diet DISCHARGE CONDITION:  Good ACTIVITY:  Activity as tolerated OXYGEN:  Home Oxygen: No.  Oxygen Delivery: room air DISCHARGE LOCATION:  home   If you experience worsening of your admission symptoms, develop shortness of breath, life threatening emergency, suicidal or homicidal thoughts you must seek medical attention immediately by calling 911 or calling your MD immediately  if symptoms less severe.  You Must read complete instructions/literature along with all the possible adverse reactions/side effects for all the Medicines you take and that have been prescribed to you. Take any new Medicines after you have completely understood and accpet all the possible adverse reactions/side effects.   Please note  You were cared for by a hospitalist during your hospital stay. If you have any questions about your discharge medications or the care you received while you were in the hospital after you are discharged, you can call the unit and asked to speak with the hospitalist on call if the hospitalist that took care of you is not available. Once you are discharged, your primary care physician will handle any further medical issues. Please note that NO REFILLS for any discharge medications will be authorized once you are discharged, as it is imperative that you return to your primary care physician (or establish a relationship with a primary care physician if you do not have one) for your aftercare needs so that they can reassess your need for medications and monitor your lab values.    On the day of Discharge:  VITAL SIGNS:  Blood pressure 126/69, pulse 79, temperature 97.9 F (36.6 C), temperature source Oral, resp. rate 18, height 5\' 2"  (1.575 m), weight 75.2 kg (165 lb 12.8 oz), SpO2 95 %. PHYSICAL EXAMINATION:  GENERAL:  77 y.o.-year-old patient lying in the bed with no acute  distress.  EYES: Pupils equal, round, reactive to light and accommodation. No scleral icterus. Extraocular muscles intact.  HEENT: Head atraumatic, normocephalic. Oropharynx and nasopharynx clear.  NECK:  Supple, no jugular venous distention. No thyroid enlargement, no tenderness.  LUNGS: Normal breath sounds bilaterally, no wheezing, rales,rhonchi or crepitation. No use of accessory muscles of respiration.  CARDIOVASCULAR: S1, S2 normal. No murmurs, rubs, or gallops.  ABDOMEN: Soft, non-tender, non-distended. Bowel sounds present. No organomegaly or mass.  EXTREMITIES: No pedal edema, cyanosis, or clubbing.  NEUROLOGIC: Cranial nerves II through XII are intact. Muscle strength 5/5 in all extremities. Sensation intact. Gait not checked.  PSYCHIATRIC: The patient is alert and oriented x 3.  SKIN: No obvious rash, lesion, or ulcer.  DATA REVIEW:   CBC Recent Labs  Lab 01/19/18 2137  WBC 8.8  HGB 13.5  HCT 39.9  PLT 300    Chemistries  Recent Labs  Lab 01/19/18 2137 01/21/18 1437  NA 138 140  K 3.2* 3.7  CL 103 104  CO2 27 28  GLUCOSE 119* 128*  BUN 26* 20  CREATININE 1.30* 0.91  CALCIUM 9.4 9.2  AST 31  --   ALT 19  --   ALKPHOS 64  --   BILITOT 0.6  --      Microbiology Results  Results for orders placed or performed in visit on 03/27/17  Urine Culture     Status: None   Collection Time: 03/27/17  3:31 PM  Result Value Ref Range Status   MICRO NUMBER: [id number]  Final   SPECIMEN QUALITY: ADEQUATE  Final   Sample Source URINE  Final   STATUS: FINAL  Final   Result:   Final    Three or more organisms present, each greater than 10,000 cu/mL. May represent normal flora contamination from external genitalia. No further testing is required.    RADIOLOGY:  Nm Myocar Multi W/spect W/wall Motion / Ef  Result Date: 01/21/2018  Blood pressure demonstrated a normal response to exercise.  There was no ST segment deviation noted during stress.  The study is normal.   This is a low risk study.  The left ventricular ejection fraction is hyperdynamic (>65%).      Management plans discussed with the patient, family and they are in agreement.  CODE STATUS: Full Code   TOTAL TIME TAKING CARE OF THIS PATIENT: 35 minutes.    Jinny Blossom Sharnese Heath M.D on 01/21/2018 at 4:04 PM  Between 7am to 6pm - Pager - [phone]  After 6pm go to [email or web address] - Social research officer, government  [facility]  Office  [phone]  CC: Primary care physician; Malva Limes, MD   Note: This dictation was prepared with Dragon dictation along with smaller phrase technology. Any transcriptional errors that result from this process are unintentional.

## 2018-01-21 NOTE — Discharge Instructions (Signed)
It was a pleasure taking care of you!  You came to the hospital because you were having chest pain. You had a stress test done, which was completely normal. Your thyroid was checked when you were here and showed that you have hypothyroidism, which means your thyroid is not as active as it should be. We have started a thyroid medication called Synthroid. Please take 1 tablet daily. Your primary care provider should recheck your thyroid in clinic.  Your potassium was low when you got to the hospital. We gave you some potassium and it came back up to a normal level. Your primary care doctor can recheck this in clinic.

## 2018-01-22 ENCOUNTER — Telehealth: Payer: Self-pay

## 2018-01-22 DIAGNOSIS — I639 Cerebral infarction, unspecified: Secondary | ICD-10-CM | POA: Diagnosis not present

## 2018-01-22 NOTE — Telephone Encounter (Signed)
Transition Care Management Follow-up Telephone Call  How have you been since you were released from the hospital? Doing much better, no chest pain, fever or n/v/d.  Do you understand why you were in the hospital? yes  Do you have a copy of your discharge instructions Yes Do you understand the discharge instrcutions? yes  Where were you discharged to? Home  Do you have support at home? Yes    Items Reviewed:  Medications obtained Yes  Medications reviewed: Yes  Dietary changes reviewed: yes, low sodium heart healthy.  Home Health? No  DME ordered at discharge obtained? No  Medical supplies: NA    Functional Questionnaire:   Activities of Daily Living (ADLs):   She states they are independent in the following: ambulation, bathing and hygiene, feeding, continence, grooming, toileting, dressing and medication management States they require assistance with the following: N/A  Any transportation issues/concerns?: no  Any patient concerns? no  Confirmed importance and date/time of follow-up visits scheduled with PCP: yes, 01/25/18 @ 2:00 PM.  Confirm appointment scheduled with specialist? No, pt plans to set up an apt with cardiology today.  Confirmed with patient if condition begins to worsen call PCP or If it's emergency go to the ER.

## 2018-01-22 NOTE — Progress Notes (Signed)
Patient: Destiny Gilbert Female    DOB: 1941/01/11   77 y.o.   MRN: [id number] Visit Date: 01/25/2018  Today's Provider: Lelon Huh, MD   Chief Complaint  Patient presents with  . Hospitalization Follow-up   Subjective:    HPI   Follow up Hospitalization  Patient was admitted to [facility] on 01/19/2018 and discharged on 01/21/2018. She was treated for Chest pain and Acute kidney injury.  Troponin negative x3. Stress test performed-negative. Patient given IV fluids for acute kidney injury due to dehydration. Patient started on Synthroid 50 mcg qd due to elevated TSH Lab Results  Component Value Date   TSH 11.020 (H) 01/20/2018    Patient has an appointment to see Dr. Saralyn Pilar on 02/02/2018. Telephone follow up was done on 01/22/2018 She reports good compliance with treatment. She reports this condition is Improved. Patient has not had anymore episodes of chest pain since being hospitalized.   She states that she typically has brief episodes of chest pain a few times a month which usually resolve without taking anything. On the day of admission she had one brief episode in the morning, and another episode later that day which did not resolve by itself and did not resolve after taking 2 episodes of SLNG. She has had no pains since hospital discharge. 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   ------------------------------------------------------------------------------------     Allergies  Allergen Reactions  . Dipyridamole Nausea Only    Headache  . Sulfa Antibiotics Nausea And Vomiting     Current Outpatient Medications:  .  acetaminophen (TYLENOL) 500 MG tablet, Take 500 mg by mouth every 6 (six) hours as needed., Disp: , Rfl:  .  albuterol (PROAIR HFA) 108 (90 BASE) MCG/ACT inhaler, Inhale 2 puffs into the lungs every 6 (six) hours as needed., Disp: , Rfl:  .  aspirin EC 81 MG tablet,  Take 81 mg by mouth every Monday, Wednesday, and Friday. , Disp: , Rfl:  .  atorvastatin (LIPITOR) 40 MG tablet, Take 1 tablet (40 mg total) by mouth daily at 6 PM., Disp: 30 tablet, Rfl: 11 .  azelastine (ASTELIN) 0.1 % nasal spray, Place 0.3 mLs into both nostrils daily., Disp: , Rfl: 12 .  clopidogrel (PLAVIX) 75 MG tablet, Take 75 mg by mouth daily., Disp: , Rfl:  .  Fluticasone-Salmeterol (ADVAIR) 250-50 MCG/DOSE AEPB, INHALE 1 DOSE BY MOUTH TWICE DAILY, Disp: 60 each, Rfl: 12 .  Glucosamine-Chondroit-Vit C-Mn (GLUCOSAMINE CHONDR 1500 COMPLX) CAPS, Take 3-4 capsules by mouth daily. Takes 3-4 capsules daily , Disp: , Rfl:  .  glucose blood (ONE TOUCH ULTRA TEST) test strip, Use to check blood sugar daily, Disp: 100 each, Rfl: 12 .  hydrocortisone (PROCTOCARE-HC) 2.5 % rectal cream, Place 1 application rectally 2 (two) times daily., Disp: 30 g, Rfl: 0 .  levothyroxine (SYNTHROID, LEVOTHROID) 50 MCG tablet, Take 1 tablet (50 mcg total) by mouth daily before breakfast., Disp: 30 tablet, Rfl: 0 .  MAGNESIUM PO, Take 250 mg by mouth at bedtime., Disp: , Rfl:  .  metFORMIN (GLUCOPHAGE-XR) 500 MG 24 hr tablet, TAKE ONE TABLET BY MOUTH ONCE DAILY FOR  BLOOD  SUGAR, Disp: 30 tablet, Rfl: 12 .  MULTIPLE VITAMIN PO, Take 1 tablet by mouth daily., Disp: , Rfl:  .  nitroGLYCERIN (NITROSTAT) 0.3 MG SL tablet, Place 1 tablet (0.3 mg total) under the tongue every 5 (five) minutes  as needed for chest pain., Disp: 90 tablet, Rfl: 0 .  triamterene-hydrochlorothiazide (MAXZIDE-25) 37.5-25 MG tablet, TAKE ONE TABLET BY MOUTH ONCE DAILY, Disp: 90 tablet, Rfl: 4  Review of Systems  Constitutional: Negative for appetite change, chills, fatigue and fever.  Respiratory: Negative for chest tightness and shortness of breath.   Cardiovascular: Negative for chest pain and palpitations.  Gastrointestinal: Negative for abdominal pain, nausea and vomiting.  Neurological: Negative for dizziness and weakness.    Social  History   Tobacco Use  . Smoking status: Never Smoker  . Smokeless tobacco: Never Used  Substance Use Topics  . Alcohol use: Yes    Alcohol/week: 2.4 oz    Types: 4 Glasses of wine per week   Objective:   BP 120/60 (BP Location: Left Arm, Patient Position: Sitting, Cuff Size: Large)   Pulse 86   Temp 98.4 F (36.9 C) (Oral)   Resp 16   Ht 5\' 2"  (1.575 m)   Wt 166 lb (75.3 kg)   SpO2 96% Comment: room air  BMI 30.36 kg/m  Vitals:   01/25/18 1401  BP: 120/60  Pulse: 86  Resp: 16  Temp: 98.4 F (36.9 C)  TempSrc: Oral  SpO2: 96%  Weight: 166 lb (75.3 kg)  Height: 5\' 2"  (1.575 m)   Audit-C Alcohol Use Screening   Alcohol Use Disorder Test (AUDIT) 01/04/2018 01/25/2018  1. How often do you have a drink containing alcohol? 2 3  2. How many drinks containing alcohol do you have on a typical day when you are drinking? 0 0  3. How often do you have six or more drinks on one occasion? 0 0  AUDIT-C Score 2 3  4. How often during the last year have you found that you were not able to stop drinking once you had started? - 0  5. How often during the last year have you failed to do what was normally expected from you becasue of drinking? - 0  6. How often during the last year have you needed a first drink in the morning to get yourself going after a heavy drinking session? - 0  7. How often during the last year have you had a feeling of guilt of remorse after drinking? - 0  8. How often during the last year have you been unable to remember what happened the night before because you had been drinking? - 0  9. Have you or someone else been injured as a result of your drinking? - 0  10. Has a relative or friend or a doctor or another health worker been concerned about your drinking or suggested you cut down? - 0  Alcohol Use Disorder Identification Test Final Score (AUDIT) - 3  Intervention/Follow-up - AUDIT Score <7 follow-up not indicated    A score of 3 or more in women, and 4 or  more in men indicates increased risk for alcohol abuse, EXCEPT if all of the points are from question 1   Physical Exam  General appearance: alert, well developed, well nourished, cooperative and in no distress Head: Normocephalic, without obvious abnormality, atraumatic Respiratory: Respirations even and unlabored, normal respiratory rate Extremities: No gross deformities Skin: Skin color, texture, turgor normal. No rashes seen  Psych: Appropriate mood and affect. Neurologic: Mental status: Alert, oriented to person, place, and time, thought content appropriate.     Assessment & Plan:     1. Chest pain, unspecified type Negative cardiac work up at [facility] - omeprazole ([place])  20 MG capsule; Take 1 capsule (20 mg total) by mouth daily.  Dispense: 30 capsule; Refill: 2  2. Hypothyroidism, unspecified type Lab Results  Component Value Date   TSH 11.020 (H) 01/20/2018  Asymptomatic, but tolerating initiation of levothyroxine from hospital. Will see if she notices any change in energy level and anticipate recheck thyroid functions at follow up in September.   3. Hypokalemia Mild, corrected with IVF, but she does have some trouble with leg  Cramps. Will start- potassium chloride (K-DUR,KLOR-CON) 10 MEQ tablet; Take 1 tablet (10 mEq total) by mouth 2 (two) times daily.  Dispense: 30 tablet; Refill: [place], MD  [facility]

## 2018-01-25 ENCOUNTER — Encounter: Payer: Self-pay | Admitting: Family Medicine

## 2018-01-25 ENCOUNTER — Ambulatory Visit (INDEPENDENT_AMBULATORY_CARE_PROVIDER_SITE_OTHER): Payer: PPO | Admitting: Family Medicine

## 2018-01-25 VITALS — BP 120/60 | HR 86 | Temp 98.4°F | Resp 16 | Ht 62.0 in | Wt 166.0 lb

## 2018-01-25 DIAGNOSIS — R079 Chest pain, unspecified: Secondary | ICD-10-CM | POA: Diagnosis not present

## 2018-01-25 DIAGNOSIS — E876 Hypokalemia: Secondary | ICD-10-CM

## 2018-01-25 DIAGNOSIS — K449 Diaphragmatic hernia without obstruction or gangrene: Secondary | ICD-10-CM

## 2018-01-25 DIAGNOSIS — E039 Hypothyroidism, unspecified: Secondary | ICD-10-CM | POA: Diagnosis not present

## 2018-01-25 MED ORDER — POTASSIUM CHLORIDE CRYS ER 10 MEQ PO TBCR: 10.0000 meq | EXTENDED_RELEASE_TABLET | Freq: Two times a day (BID) | ORAL | 5 refills | Status: DC

## 2018-01-25 MED ORDER — OMEPRAZOLE 20 MG PO CPDR
20.0000 mg | DELAYED_RELEASE_CAPSULE | Freq: Every day | ORAL | 2 refills | Status: DC
Start: 2018-01-25 — End: 2018-03-29

## 2018-01-29 ENCOUNTER — Other Ambulatory Visit: Payer: Self-pay | Admitting: Pharmacist

## 2018-01-29 NOTE — Patient Outreach (Signed)
[place] [facility]) Care Management  01/29/2018  Destiny Gilbert [birth year] [id number]  77 year old female requiring discharge medication reconciliation (30 day post discharge). PMH significant for:  T2DM (A1c 6.4 on 11/24/17), HLD.   SUBJECTIVE Successful call to Destiny Gilbert today to perform discharge medication reconciliation (30-day post discharge).  HIPAA identifiers verified.  Destiny Gilbert informed me of her medication changes as below.  Patient uses Product/process development scientist.  She is able to obtain all of her medications each month.  OBJECTIVE Medications Reviewed Today    Reviewed by Destiny Gilbert, [facility] (Pharmacist) on 01/29/18 at 1343  Med List Status: <None>  Medication Order Taking? Sig Documenting Provider Last Dose Status Informant  acetaminophen (TYLENOL) 500 MG tablet [id number] Yes Take 500 mg by mouth every 6 (six) hours as needed. [provider] Taking Active Self  albuterol (PROAIR HFA) 108 (90 BASE) MCG/ACT inhaler [id number] Yes Inhale 2 puffs into the lungs every 6 (six) hours as needed. [provider] Taking Active Self           Med Note Destiny Gilbert, [facility]   Mon Jan 05, 2017  7:47 AM)    aspirin EC 81 MG tablet [id number] Yes Take 81 mg by mouth every Monday, Wednesday, and Friday.  [provider] Taking Active Self  atorvastatin (LIPITOR) 40 MG tablet [id number] Yes Take 1 tablet (40 mg total) by mouth daily at 6 PM. Destiny Sons, MD Taking Active Self  azelastine (ASTELIN) 0.1 % nasal spray [id number] Yes Place 0.3 mLs into both nostrils daily. [provider] Taking Active Self  clopidogrel (PLAVIX) 75 MG tablet [id number] Yes Take 75 mg by mouth daily. [provider] Taking Active Self  Fluticasone-Salmeterol (ADVAIR) 250-50 MCG/DOSE AEPB [id number] Yes INHALE 1 DOSE BY MOUTH TWICE DAILY Gilbert, Destiny Peri, MD Taking Active Self  Glucosamine-Chondroit-Vit C-Mn (GLUCOSAMINE CHONDR 1500 COMPLX) CAPS [id number] Yes Take 3-4  capsules by mouth daily. Takes 3-4 capsules daily  [provider] Taking Active Self  glucose blood (ONE TOUCH ULTRA TEST) test strip [id number] Yes Use to check blood sugar daily Destiny Sons, MD Taking Active Self  hydrocortisone [facility]) 2.5 % rectal cream [id number] Yes Place 1 application rectally 2 (two) times daily. Destiny Sons, MD Taking Active Self  levothyroxine (SYNTHROID, LEVOTHROID) 50 MCG tablet [id number] Yes Take 1 tablet (50 mcg total) by mouth daily before breakfast. Gilbert, Destiny Pelt, MD Taking Active   metFORMIN (GLUCOPHAGE-XR) 500 MG 24 hr tablet [id number] Yes TAKE ONE TABLET BY MOUTH ONCE DAILY FOR  BLOOD  SUGAR Destiny Sons, MD Taking Active Self  nitroGLYCERIN (NITROSTAT) 0.3 MG SL tablet [id number] Yes Place 1 tablet (0.3 mg total) under the tongue every 5 (five) minutes as needed for chest pain. Destiny Leber, MD Taking Active Self  omeprazole (PRILOSEC) 20 MG capsule [id number] Yes Take 1 capsule (20 mg total) by mouth daily. Destiny Sons, MD Taking Active   potassium chloride (K-DUR,KLOR-CON) 10 MEQ tablet [id number] Yes Take 1 tablet (10 mEq total) by mouth 2 (two) times daily. Destiny Sons, MD Taking Active   triamterene-hydrochlorothiazide [facility]) 37.5-25 MG tablet [id number] Yes TAKE ONE TABLET BY MOUTH ONCE DAILY Destiny Sons, MD Taking Active Self          ASSESSMENT: Date Discharged from Hospital: 01/21/18 Date Medication Reconciliation Performed: 01/29/2018  New Medications at Discharge:   Levothyroxine   Omeprazole  Patient was recently discharged from hospital and all medications have been reviewed  Drugs sorted by system:  Cardiovascular: ASA, atorvastatin, clopidogrel, triamterene/HCTZ  Pulmonary/Allergy: fluticasone/salmeterol, albuterol HFA  Gastrointestinal: omeprazole  Endocrine: levothyroxine, metformin  Topical: azelastine nasal spray  Medications to avoid in the elderly:  HCTZ (monitor closely  for electrolyte imbalance/renal function)  Other issues noted:  -Patient no longer taking potassium, MVI, & magnesium because she states it interacts with some of her medications.  She stated [facility] noted the potassium recently prescribed by here PCP interacted with her medications.  She wasn't sure which medications, but I'm assuming she is referring to the drug-drug interaction between triamterene & potassium.  Her potassium levels are within normal limits. Patient does not wish to take.  PLAN: -Instructed patient to take new medications as prescribed  -Will route letter to PCP    Destiny Gilbert, PharmD, [place]  [phone]

## 2018-02-02 DIAGNOSIS — R079 Chest pain, unspecified: Secondary | ICD-10-CM | POA: Diagnosis not present

## 2018-02-02 DIAGNOSIS — E7800 Pure hypercholesterolemia, unspecified: Secondary | ICD-10-CM | POA: Diagnosis not present

## 2018-02-02 DIAGNOSIS — I639 Cerebral infarction, unspecified: Secondary | ICD-10-CM | POA: Diagnosis not present

## 2018-02-02 DIAGNOSIS — I10 Essential (primary) hypertension: Secondary | ICD-10-CM | POA: Diagnosis not present

## 2018-02-02 DIAGNOSIS — I63532 Cerebral infarction due to unspecified occlusion or stenosis of left posterior cerebral artery: Secondary | ICD-10-CM | POA: Diagnosis not present

## 2018-02-03 ENCOUNTER — Ambulatory Visit
Admission: RE | Admit: 2018-02-03 | Discharge: 2018-02-03 | Disposition: A | Discharge status: Home / Self Care | Payer: PPO | Source: Ambulatory Visit | Attending: Family Medicine | Admitting: Family Medicine

## 2018-02-03 DIAGNOSIS — E2839 Other primary ovarian failure: Secondary | ICD-10-CM | POA: Diagnosis not present

## 2018-02-03 DIAGNOSIS — Z780 Asymptomatic menopausal state: Secondary | ICD-10-CM | POA: Diagnosis not present

## 2018-02-03 DIAGNOSIS — Z13820 Encounter for screening for osteoporosis: Secondary | ICD-10-CM | POA: Diagnosis not present

## 2018-02-17 ENCOUNTER — Other Ambulatory Visit: Payer: Self-pay | Admitting: Family Medicine

## 2018-02-17 MED ORDER — LEVOTHYROXINE SODIUM 50 MCG PO TABS: 50.0000 ug | ORAL_TABLET | Freq: Every day | ORAL | 3 refills | Status: DC

## 2018-02-17 MED ORDER — LEVOTHYROXINE SODIUM 50 MCG PO TABS: 50.0000 ug | ORAL_TABLET | Freq: Every day | ORAL | 0 refills | Status: DC

## 2018-03-01 DIAGNOSIS — Z8673 Personal history of transient ischemic attack (TIA), and cerebral infarction without residual deficits: Secondary | ICD-10-CM | POA: Diagnosis not present

## 2018-03-03 ENCOUNTER — Other Ambulatory Visit: Payer: Self-pay | Admitting: Family Medicine

## 2018-03-09 DIAGNOSIS — K219 Gastro-esophageal reflux disease without esophagitis: Secondary | ICD-10-CM | POA: Diagnosis not present

## 2018-03-09 DIAGNOSIS — J301 Allergic rhinitis due to pollen: Secondary | ICD-10-CM | POA: Diagnosis not present

## 2018-03-09 DIAGNOSIS — J339 Nasal polyp, unspecified: Secondary | ICD-10-CM | POA: Diagnosis not present

## 2018-03-10 DIAGNOSIS — I63532 Cerebral infarction due to unspecified occlusion or stenosis of left posterior cerebral artery: Secondary | ICD-10-CM | POA: Diagnosis not present

## 2018-03-10 DIAGNOSIS — E7800 Pure hypercholesterolemia, unspecified: Secondary | ICD-10-CM | POA: Diagnosis not present

## 2018-03-10 DIAGNOSIS — I10 Essential (primary) hypertension: Secondary | ICD-10-CM | POA: Diagnosis not present

## 2018-03-18 ENCOUNTER — Other Ambulatory Visit: Payer: Self-pay | Admitting: Family Medicine

## 2018-03-18 DIAGNOSIS — E119 Type 2 diabetes mellitus without complications: Secondary | ICD-10-CM

## 2018-03-18 MED ORDER — METFORMIN HCL ER 500 MG PO TB24: ORAL_TABLET | ORAL | 12 refills | Status: DC

## 2018-03-18 NOTE — Telephone Encounter (Signed)
[facility] pharmacy faxed a refill request for the following medication. Thanks CC ° °metFORMIN (GLUCOPHAGE-XR) 500 MG 24 hr tablet  ° °

## 2018-03-21 ENCOUNTER — Other Ambulatory Visit: Payer: Self-pay | Admitting: Family Medicine

## 2018-03-29 ENCOUNTER — Encounter: Payer: Self-pay | Admitting: Family Medicine

## 2018-03-29 ENCOUNTER — Ambulatory Visit (INDEPENDENT_AMBULATORY_CARE_PROVIDER_SITE_OTHER): Payer: PPO | Admitting: Family Medicine

## 2018-03-29 VITALS — BP 130/76 | HR 72 | Temp 97.7°F | Resp 16 | Ht 62.0 in | Wt 165.0 lb

## 2018-03-29 DIAGNOSIS — E039 Hypothyroidism, unspecified: Secondary | ICD-10-CM | POA: Diagnosis not present

## 2018-03-29 DIAGNOSIS — I480 Paroxysmal atrial fibrillation: Secondary | ICD-10-CM | POA: Insufficient documentation

## 2018-03-29 DIAGNOSIS — I679 Cerebrovascular disease, unspecified: Secondary | ICD-10-CM

## 2018-03-29 DIAGNOSIS — I10 Essential (primary) hypertension: Secondary | ICD-10-CM

## 2018-03-29 DIAGNOSIS — Z23 Encounter for immunization: Secondary | ICD-10-CM | POA: Diagnosis not present

## 2018-03-29 DIAGNOSIS — E1159 Type 2 diabetes mellitus with other circulatory complications: Secondary | ICD-10-CM | POA: Diagnosis not present

## 2018-03-29 LAB — POCT UA - MICROALBUMIN: Microalbumin Ur, POC: 20 mg/L

## 2018-03-29 LAB — POCT GLYCOSYLATED HEMOGLOBIN (HGB A1C)
Est. average glucose Bld gHb Est-mCnc: 134
Hemoglobin A1C: 6.3 % — AB (ref 4.0–5.6)

## 2018-03-29 MED ORDER — HYDROCORTISONE 2.5 % RE CREA: 1.0000 "application " | TOPICAL_CREAM | Freq: Two times a day (BID) | RECTAL | 3 refills | Status: DC

## 2018-03-29 NOTE — Progress Notes (Signed)
Patient: Destiny Gilbert Female    DOB: Nov 11, 1940   77 y.o.   MRN: [id number] Visit Date: 03/29/2018  Today's Provider: Lelon Huh, MD   Chief Complaint  Patient presents with  . Diabetes  . Hypertension  . Hyperlipidemia   Subjective:    HPI   Diabetes Mellitus Type II, Follow-up:   Lab Results  Component Value Date   HGBA1C 6.3 (A) 03/29/2018   HGBA1C 6.4 (A) 11/24/2017   HGBA1C 6.3 05/27/2017   Last seen for diabetes 4 months ago.  Management since then includes; no changes. She reports good compliance with treatment. She is not having side effects.  Current symptoms include none and have been stable. Home blood sugar records: fasting range: 120's  Episodes of hypoglycemia? no   Current Insulin Regimen: none Most Recent Eye Exam: > 1 year ago Weight trend: stable Prior visit with dietician: no Current diet: in general, an "unhealthy" diet Current exercise: none  Lab Results  Component Value Date   NA 140 01/21/2018   K 3.7 01/21/2018   CL 104 01/21/2018   CO2 28 01/21/2018   GLUCOSE 128 (H) 01/21/2018   BUN 20 01/21/2018   CREATININE 0.91 01/21/2018   CALCIUM 9.2 01/21/2018   GFRNONAA >60 01/21/2018   GFRAA >60 01/21/2018    ---------------------------------------------------------------   Hypertension, follow-up:  BP Readings from Last 3 Encounters:  03/29/18 130/76  01/25/18 120/60  01/21/18 126/69    She was last seen for hypertension 2 months ago.  BP at that visit was 134/72. Management since that visit includes; no changes.She reports good compliance with treatment. She is not having side effects.  She is not exercising. She is not adherent to low salt diet.   Outside blood pressures are 118/70. She is experiencing none.  Patient denies chest pain, chest pressure/discomfort, claudication, dyspnea, exertional chest pressure/discomfort, fatigue, irregular heart beat, lower extremity edema, near-syncope, orthopnea,  palpitations, paroxysmal nocturnal dyspnea, syncope and tachypnea.   Cardiovascular risk factors include diabetes mellitus and hypertension.  Use of agents associated with hypertension: thyroid hormones.   ---------------------------------------------------------------    Lipid/Cholesterol, Follow-up:   Last seen for this 2 months ago.  Management since that visit includes; labs checked, no changes.  Last Lipid Panel:    Component Value Date/Time   CHOL 190 01/04/2018 1131   TRIG 113 01/04/2018 1131   HDL 69 01/04/2018 1131   CHOLHDL 2.8 01/04/2018 1131   CHOLHDL 3.3 01/06/2017 0448   VLDL 44 (H) 01/06/2017 0448   LDLCALC 98 01/04/2018 1131    She reports excellent compliance with treatment. She is not having side effects.   Wt Readings from Last 3 Encounters:  03/29/18 165 lb (74.8 kg)  01/25/18 166 lb (75.3 kg)  01/21/18 165 lb 12.8 oz (75.2 kg)    ---------------------------------------------------------------  Hypothyroidism, unspecified type From 01/25/2018-no changes. Patient reports good compliance with treatment. Lab Results  Component Value Date   TSH 11.020 (H) 01/20/2018    She had cardiac monitor done by her cardiologist recently with findings of periodic episodes of atrial fibrillation. In light of prior CVA .clopidogrel was changed to Eliquis for CVA prophylaxis. She is tolerating well and states she is having less bruising than she was when she taking clopidogrel    Allergies  Allergen Reactions  . Dipyridamole Nausea Only    Headache  . Sulfa Antibiotics Nausea And Vomiting     Current Outpatient Medications:  .  acetaminophen (TYLENOL) 500 MG  tablet, Take 500 mg by mouth every 6 (six) hours as needed., Disp: , Rfl:  .  albuterol (PROAIR HFA) 108 (90 BASE) MCG/ACT inhaler, Inhale 2 puffs into the lungs every 6 (six) hours as needed., Disp: , Rfl:  .  atorvastatin (LIPITOR) 40 MG tablet, TAKE 1 TABLET BY MOUTH ONCE DAILY AT  6  PM, Disp: 90  tablet, Rfl: 4 .  azelastine (ASTELIN) 0.1 % nasal spray, Place 0.3 mLs into both nostrils daily., Disp: , Rfl: 12 .  Fluticasone-Salmeterol (ADVAIR) 250-50 MCG/DOSE AEPB, INHALE 1 DOSE BY MOUTH TWICE DAILY, Disp: 60 each, Rfl: 12 .  Glucosamine-Chondroit-Vit C-Mn (GLUCOSAMINE CHONDR 1500 COMPLX) CAPS, Take 3-4 capsules by mouth daily. Takes 3-4 capsules daily , Disp: , Rfl:  .  glucose blood (ONE TOUCH ULTRA TEST) test strip, Use to check blood sugar daily, Disp: 100 each, Rfl: 12 .  hydrocortisone (PROCTOCARE-HC) 2.5 % rectal cream, Place 1 application rectally 2 (two) times daily., Disp: 30 g, Rfl: 0 .  levothyroxine (SYNTHROID, LEVOTHROID) 50 MCG tablet, Take 1 tablet (50 mcg total) by mouth daily before breakfast., Disp: 30 tablet, Rfl: 3 .  metFORMIN (GLUCOPHAGE-XR) 500 MG 24 hr tablet, TAKE ONE TABLET BY MOUTH ONCE DAILY FOR  BLOOD  SUGAR, Disp: 30 tablet, Rfl: 12 .  nitroGLYCERIN (NITROSTAT) 0.3 MG SL tablet, Place 1 tablet (0.3 mg total) under the tongue every 5 (five) minutes as needed for chest pain., Disp: 90 tablet, Rfl: 0 .  triamterene-hydrochlorothiazide (MAXZIDE-25) 37.5-25 MG tablet, TAKE ONE TABLET BY MOUTH ONCE DAILY, Disp: 90 tablet, Rfl: 4 .  ELIQUIS 5 MG TABS tablet, Take 5 mg by mouth every 12 (twelve) hours., Disp: , Rfl: 11 .  pantoprazole (PROTONIX) 40 MG tablet, TAKE 1 TABLET BY MOUTH ONCE DAILY 30 TO 60 MINUTES PRIOR TO EVENING MEAL, Disp: , Rfl: 12 .  potassium chloride (K-DUR) 10 MEQ tablet, Take 1 tablet by mouth 2 (two) times daily., Disp: , Rfl:   Review of Systems  Constitutional: Negative for appetite change, chills, fatigue and fever.  Respiratory: Negative for chest tightness and shortness of breath.   Cardiovascular: Negative for chest pain and palpitations.  Gastrointestinal: Negative for abdominal pain, nausea and vomiting.  Neurological: Negative for dizziness and weakness.    Social History   Tobacco Use  . Smoking status: Never Smoker  . Smokeless  tobacco: Never Used  Substance Use Topics  . Alcohol use: Yes    Alcohol/week: 4.0 standard drinks    Types: 4 Glasses of wine per week   Objective:   BP 130/76 (BP Location: Left Arm, Patient Position: Sitting, Cuff Size: Large)   Pulse 72   Temp 97.7 F (36.5 C) (Oral)   Resp 16   Ht 5\' 2"  (1.575 m)   Wt 165 lb (74.8 kg)   SpO2 96% Comment: room air  BMI 30.18 kg/m     Physical Exam   General Appearance:    Alert, cooperative, no distress  Eyes:    PERRL, conjunctiva/corneas clear, EOM's intact       Lungs:     Clear to auscultation bilaterally, respirations unlabored  Heart:    Regular rate and rhythm  Neurologic:   Awake, alert, oriented x 3. No apparent focal neurological           defect.       Results for orders placed or performed in visit on 03/29/18  POCT HgB A1C  Result Value Ref Range   Hemoglobin A1C 6.3 (  A) 4.0 - 5.6 %   HbA1c POC (<> result, manual entry)     HbA1c, POC (prediabetic range)     HbA1c, POC (controlled diabetic range)     Est. average glucose Bld gHb Est-mCnc 134   POCT UA - Microalbumin  Result Value Ref Range   Microalbumin Ur, POC 20 mg/L   Creatinine, POC     Albumin/Creatinine Ratio, Urine, POC         Assessment & Plan:     1. Type 2 diabetes mellitus with vascular disease ([place]) Well controlled.  Continue current medications.  Counseled time for diabetic eye exam.  - POCT HgB A1C - POCT UA - Microalbumin  2. Intermittent atrial fibrillation ([facility]) Tolerating recent change from clopidogrel to Eliquis. Continue current medications.    3. Cerebrovascular disease S/p CVA 2018. No current neurologic sx.   4. Essential (primary) hypertension Well controlled.  Continue current medications.    5. Hypothyroidism, unspecified type Mildly elevated TSH in July. Continue current medications.  Check thyroid functions at follow up o.v.   6. Need for influenza vaccination  - Flu vaccine HIGH DOSE PF (Fluzone High dose)  Return in  about 5 months (around 08/29/2018).       Lelon Huh, MD  [facility]

## 2018-03-29 NOTE — Patient Instructions (Addendum)
.   Please contact your eyecare professional to schedule a routine eye exam   The CDC recommends two doses of Shingrix (the shingles vaccine) separated by 2 to 6 months for adults age 75 years and older. I recommend checking with your insurance plan regarding coverage for this vaccine.

## 2018-04-01 ENCOUNTER — Telehealth: Payer: Self-pay | Admitting: Family Medicine

## 2018-04-01 MED ORDER — NITROFURANTOIN MONOHYD MACRO 100 MG PO CAPS: 100.0000 mg | ORAL_CAPSULE | Freq: Two times a day (BID) | ORAL | 0 refills | Status: DC

## 2018-04-01 NOTE — Telephone Encounter (Signed)
Patient advised and verbally voiced understanding.  

## 2018-04-01 NOTE — Telephone Encounter (Signed)
Please advise 

## 2018-04-01 NOTE — Telephone Encounter (Signed)
Pt called stating she is burning while urination.  Pt was just in on 9-23 to see Dr. Caryn Section.  There is no availability today. Pt wanting to know if she could be worked in or give a sample for testing.  Please advise.  Thanks, [employer]

## 2018-04-01 NOTE — Telephone Encounter (Signed)
[facility]. Office visit if not completely better when finished.

## 2018-05-06 ENCOUNTER — Other Ambulatory Visit: Payer: Self-pay | Admitting: *Deleted

## 2018-05-06 NOTE — Patient Outreach (Signed)
[place] [facility]) Care Management  05/06/2018  Destiny Gilbert [birth year] [id number]   Telephone Screen  Referral Date: 05/04/18 Referral Source: HTA referral  Referral Reason: Has 6 days of eliquis left, Member is struggling with affording her medication since she has entered the coverage gap Insurance: HTA   Outreach attempt # 1 successful at her home address  Patient is able to verify HIPAA Reviewed and addressed referral to [facility] with patient She confirms with CM that she is in the donut hole but had to purchase medication today 10/31/9 only because she is planning to go out of town this weekend but returns on next week   Social: Destiny Gilbert lives at home with her husband and is independent with her ADLs, iADLs and transportation  She is on SSI   Conditions: DM, HTN, CVA, intermittent atrial fibrillation, asthma, allergic rhinitis, DM   Medications: She reports on 05/06/18 she paid $70 for Eliquis She reports she was prescribed Eliquis in September 2019 and initially got it free with  Coupon  In October she paid  $35- 40 for the Eliquis.  Dr Dustin Flock ordered Eliquis This MD works in her cardiologist MD office (Dr Saralyn Pilar)  She states she was having bruising with the use of Plavix and now with Eliquis is not having bruising She takes 1 tab q 12 hrs  She is aware that she now will be paying 25% of her other medicines until the end of the year  She states she is in the coverage gap related to filling 3 Advair  because she felt it was cheaper to purchase 3 Advair at $90 than one Advair at $45  Appointments: Dr Lelon Huh, primary MD office was seen in October 2019   Advance Directives: Denies need for assist with changes to present advance directives     Consent: [facility] RN CM reviewed [facility] services with patient. Patient gave verbal consent for services [facility] pharmacy.  Plan: [facility] RN CM will refer Destiny Hoch to [place] for coverage gap assistance with  Eliquis and other anticipated medicines   Kimberly L. Lavina Hamman, RN, BSN, [place] Coordinator Office number [phone] Mobile number [phone]  Main [facility] number [phone] Fax number [phone]

## 2018-05-10 ENCOUNTER — Other Ambulatory Visit: Payer: Self-pay | Admitting: Pharmacist

## 2018-05-10 NOTE — Patient Outreach (Signed)
[employer] [facility]) Care Management  [place]   05/10/2018  Destiny Gilbert 02-05-1941 [id number]  Reason for referral: medication assistance  Referral source: HTA Referral medication(s): Advair,  Current insurance:HTA  PMHx: Diabetes mellitus, HTN, HLD, PAF, hx CVA, CAD, hypothyroidism  Outreach:  Successful call to Destiny Gilbert. HIPAA identifiers verified. Patient agreeable to review medications telephonically.    Objective: Allergies  Allergen Reactions  . Dipyridamole Nausea Only    Headache  . Sulfa Antibiotics Nausea And Vomiting    Medications Reviewed Today    Reviewed by Rudean Haskell, RPH (Pharmacist) on 05/10/18 at [place] List Status: <None>  Medication Order Taking? Sig Documenting Provider Last Dose Status Informant  acetaminophen (TYLENOL) 500 MG tablet [id number] Yes Take 500 mg by mouth every 6 (six) hours as needed. [provider] Taking Active Self  albuterol (PROAIR HFA) 108 (90 BASE) MCG/ACT inhaler [id number] No Inhale 2 puffs into the lungs every 6 (six) hours as needed. [provider] Not Taking Active Self           Med Note Josiah Lobo, [facility]   Mon Jan 05, 2017  7:47 AM)    atorvastatin (LIPITOR) 40 MG tablet [id number] Yes TAKE 1 TABLET BY MOUTH ONCE DAILY AT  6  PM Fisher, Kirstie Peri, MD Taking Active   azelastine (ASTELIN) 0.1 % nasal spray [id number] Yes Place 0.3 mLs into both nostrils daily. [provider] Taking Active Self  ELIQUIS 5 MG TABS tablet [id number] Yes Take 5 mg by mouth every 12 (twelve) hours. [provider] Taking Active   Fluticasone-Salmeterol (ADVAIR) 250-50 MCG/DOSE AEPB [id number] Yes INHALE 1 DOSE BY MOUTH TWICE DAILY Fisher, Kirstie Peri, MD Taking Active Self  Glucosamine-Chondroit-Vit C-Mn (GLUCOSAMINE CHONDR 1500 COMPLX) CAPS [id number] Yes Take 3-4 capsules by mouth daily. Takes 3-4 capsules daily  [provider] Taking Active Self  glucose blood (ONE TOUCH ULTRA  TEST) test strip [id number] Yes Use to check blood sugar daily Birdie Sons, MD Taking Active Self  hydrocortisone [facility]) 2.5 % rectal cream [id number] Yes Place 1 application rectally 2 (two) times daily. Birdie Sons, MD Taking Active   levothyroxine (SYNTHROID, LEVOTHROID) 50 MCG tablet [id number] Yes Take 1 tablet (50 mcg total) by mouth daily before breakfast. Birdie Sons, MD Taking Active   metFORMIN (GLUCOPHAGE-XR) 500 MG 24 hr tablet [id number] Yes TAKE ONE TABLET BY MOUTH ONCE DAILY FOR  BLOOD  SUGAR Birdie Sons, MD Taking Active   nitroGLYCERIN (NITROSTAT) 0.3 MG SL tablet [id number] No Place 1 tablet (0.3 mg total) under the tongue every 5 (five) minutes as needed for chest pain.  Patient not taking:  Reported on 05/10/2018   Henreitta Leber, MD Not Taking Active Self  pantoprazole (PROTONIX) 40 MG tablet [id number] Yes TAKE 1 TABLET BY MOUTH ONCE DAILY 30 TO 60 MINUTES PRIOR TO EVENING MEAL [provider] Taking Active   potassium chloride (K-DUR) 10 MEQ tablet [id number] Yes Take 1 tablet by mouth 2 (two) times daily. [provider] Taking Active   triamterene-hydrochlorothiazide (MAXZIDE-25) 37.5-25 MG tablet [id number] Yes TAKE ONE TABLET BY MOUTH ONCE DAILY Birdie Sons, MD Taking Active Self          Assessment:  Drugs sorted by system:  Cardiovascular: atorvastatin, apixaban, SL NTG, triamterene-hydrochlorothiazide  Pulmonary/Allergy: albuterol INH, fluticasone-salmeterol INH, azelastine  Gastrointestinal: pantoprazole  Endocrine: levothyroxine, metformin  Topical: hydrocortisone cream  Pain:acetaminophen  Vitamins/Minerals/Supplements: glucosamine-chondroitin, potassium  Medication Review Findings:  .  No rate / rhythm control medications currently for PAF, patient followed by [facility] cardiology closely . Apixaban dose appropriate  Medication Assistance Findings:  Extra Help:   '[]'$  Already receiving Full Extra Help  '[]'$   Already receiving Partial Extra Help  '[]'$  Eligible based on reported income and assets  '[x]'$  Not Eligible based on reported income and assets  TROOP per HTA: $591.36  Patient Assistance Programs: 1) Advair made by [place] o Income requirement met: '[]'$  Yes '[x]'$  No '[]'$  Unknown o Out-of-pocket prescription expenditure met:    '[]'$  Yes '[x]'$  No  '[]'$  Unknown  '[]'$  Not applicable        2)  Eliquis made by [facility] o Income requirement met: '[]'$  Yes '[x]'$  No  '[]'$  Unknown o Out-of-pocket prescription expenditure met:   '[]'$  Yes '[x]'$  No   '[]'$  Unknown '[]'$  Not applicable   Additional medication assistance options reviewed with patient as warranted:  No other options identified  Reviewed patient assistance programs.  Patient not currently eligible to apply at this time based on current income.  Patient voiced understanding.  She denies any other medication concerns at this time.   Plan: Will close [facility] pharmacy case at this tsime. Patient has been provided [facility] CM contact information if assistance needed in the future.    Thank you for allowing [facility] pharmacy to be involved in this patient's care.    Ralene Bathe, PharmD, [place] [phone]

## 2018-05-18 ENCOUNTER — Telehealth: Payer: Self-pay | Admitting: Family Medicine

## 2018-05-18 ENCOUNTER — Other Ambulatory Visit: Payer: Self-pay | Admitting: Family Medicine

## 2018-05-18 DIAGNOSIS — I10 Essential (primary) hypertension: Secondary | ICD-10-CM

## 2018-05-18 DIAGNOSIS — E876 Hypokalemia: Secondary | ICD-10-CM

## 2018-05-18 MED ORDER — POTASSIUM CHLORIDE ER 10 MEQ PO TBCR: 10.0000 meq | EXTENDED_RELEASE_TABLET | Freq: Two times a day (BID) | ORAL | 4 refills | Status: DC

## 2018-05-18 NOTE — Telephone Encounter (Signed)
Pt has 2 days left of potassium chloride (K-DUR) 10 MEQ tablet.  Pt wanting to know what her potassium levels were at her last lab visit. And if she still needs to continue on medication.  Please advise.  Thanks, [employer]

## 2018-05-18 NOTE — Telephone Encounter (Signed)
Pt's last labs where is July and her potassium was normal.  Please advise.  Thanks,   -Mickel Baas

## 2018-05-18 NOTE — Telephone Encounter (Signed)
Pt advised.   Thanks,   -Laura  

## 2018-05-18 NOTE — Telephone Encounter (Signed)
Yes, she needs to stay on potassium. [facility]

## 2018-05-25 ENCOUNTER — Other Ambulatory Visit: Payer: Self-pay | Admitting: Family Medicine

## 2018-06-08 ENCOUNTER — Other Ambulatory Visit: Payer: Self-pay | Admitting: Family Medicine

## 2018-06-08 DIAGNOSIS — Z8673 Personal history of transient ischemic attack (TIA), and cerebral infarction without residual deficits: Secondary | ICD-10-CM | POA: Diagnosis not present

## 2018-06-08 DIAGNOSIS — I63532 Cerebral infarction due to unspecified occlusion or stenosis of left posterior cerebral artery: Secondary | ICD-10-CM | POA: Diagnosis not present

## 2018-06-08 DIAGNOSIS — Z95818 Presence of other cardiac implants and grafts: Secondary | ICD-10-CM | POA: Diagnosis not present

## 2018-06-08 DIAGNOSIS — E7800 Pure hypercholesterolemia, unspecified: Secondary | ICD-10-CM | POA: Diagnosis not present

## 2018-06-08 DIAGNOSIS — I639 Cerebral infarction, unspecified: Secondary | ICD-10-CM | POA: Diagnosis not present

## 2018-06-08 DIAGNOSIS — I4891 Unspecified atrial fibrillation: Secondary | ICD-10-CM | POA: Diagnosis not present

## 2018-06-08 DIAGNOSIS — I10 Essential (primary) hypertension: Secondary | ICD-10-CM | POA: Diagnosis not present

## 2018-06-08 DIAGNOSIS — R079 Chest pain, unspecified: Secondary | ICD-10-CM | POA: Diagnosis not present

## 2018-06-23 ENCOUNTER — Other Ambulatory Visit: Payer: Self-pay | Admitting: Family Medicine

## 2018-07-12 ENCOUNTER — Other Ambulatory Visit: Payer: Self-pay | Admitting: Family Medicine

## 2018-07-12 DIAGNOSIS — Z1231 Encounter for screening mammogram for malignant neoplasm of breast: Secondary | ICD-10-CM

## 2018-07-19 ENCOUNTER — Encounter: Payer: Self-pay | Admitting: Emergency Medicine

## 2018-07-19 ENCOUNTER — Emergency Department: Payer: PPO

## 2018-07-19 ENCOUNTER — Other Ambulatory Visit: Payer: Self-pay

## 2018-07-19 ENCOUNTER — Ambulatory Visit: Payer: PPO | Admitting: Family Medicine

## 2018-07-19 ENCOUNTER — Emergency Department
Admission: EM | Admit: 2018-07-19 | Discharge: 2018-07-19 | Disposition: A | Discharge status: Home / Self Care | Payer: PPO | Attending: Emergency Medicine | Admitting: Emergency Medicine

## 2018-07-19 DIAGNOSIS — Z7984 Long term (current) use of oral hypoglycemic drugs: Secondary | ICD-10-CM | POA: Diagnosis not present

## 2018-07-19 DIAGNOSIS — R51 Headache: Secondary | ICD-10-CM | POA: Diagnosis not present

## 2018-07-19 DIAGNOSIS — I10 Essential (primary) hypertension: Secondary | ICD-10-CM | POA: Insufficient documentation

## 2018-07-19 DIAGNOSIS — E039 Hypothyroidism, unspecified: Secondary | ICD-10-CM | POA: Diagnosis not present

## 2018-07-19 DIAGNOSIS — Z79899 Other long term (current) drug therapy: Secondary | ICD-10-CM | POA: Diagnosis not present

## 2018-07-19 DIAGNOSIS — J45909 Unspecified asthma, uncomplicated: Secondary | ICD-10-CM | POA: Insufficient documentation

## 2018-07-19 DIAGNOSIS — R519 Headache, unspecified: Secondary | ICD-10-CM

## 2018-07-19 DIAGNOSIS — E119 Type 2 diabetes mellitus without complications: Secondary | ICD-10-CM | POA: Insufficient documentation

## 2018-07-19 LAB — CBC WITH DIFFERENTIAL/PLATELET
Abs Immature Granulocytes: 0.01 10*3/uL (ref 0.00–0.07)
Basophils Absolute: 0 10*3/uL (ref 0.0–0.1)
Basophils Relative: 1 %
EOS ABS: 0.3 10*3/uL (ref 0.0–0.5)
Eosinophils Relative: 7 %
HCT: 40.6 % (ref 36.0–46.0)
Hemoglobin: 13.4 g/dL (ref 12.0–15.0)
Immature Granulocytes: 0 %
Lymphocytes Relative: 27 %
Lymphs Abs: 1.1 10*3/uL (ref 0.7–4.0)
MCH: 31.1 pg (ref 26.0–34.0)
MCHC: 33 g/dL (ref 30.0–36.0)
MCV: 94.2 fL (ref 80.0–100.0)
Monocytes Absolute: 0.9 10*3/uL (ref 0.1–1.0)
Monocytes Relative: 20 %
Neutro Abs: 1.9 10*3/uL (ref 1.7–7.7)
Neutrophils Relative %: 45 %
Platelets: 250 10*3/uL (ref 150–400)
RBC: 4.31 MIL/uL (ref 3.87–5.11)
RDW: 12.3 % (ref 11.5–15.5)
WBC: 4.2 10*3/uL (ref 4.0–10.5)
nRBC: 0 % (ref 0.0–0.2)

## 2018-07-19 LAB — COMPREHENSIVE METABOLIC PANEL
ALT: 15 U/L (ref 0–44)
AST: 27 U/L (ref 15–41)
Albumin: 3.9 g/dL (ref 3.5–5.0)
Alkaline Phosphatase: 78 U/L (ref 38–126)
Anion gap: 10 (ref 5–15)
BUN: 18 mg/dL (ref 8–23)
CO2: 23 mmol/L (ref 22–32)
Calcium: 9.2 mg/dL (ref 8.9–10.3)
Chloride: 102 mmol/L (ref 98–111)
Creatinine, Ser: 0.89 mg/dL (ref 0.44–1.00)
GFR calc Af Amer: >60 mL/min (ref >60)
GFR calc non Af Amer: >60 mL/min (ref >60)
Glucose, Bld: 115 mg/dL — ABNORMAL HIGH (ref 70–99)
POTASSIUM: 3.7 mmol/L (ref 3.5–5.1)
Sodium: 135 mmol/L (ref 135–145)
Total Bilirubin: 0.5 mg/dL (ref 0.3–1.2)
Total Protein: 7.3 g/dL (ref 6.5–8.1)

## 2018-07-19 MED ORDER — BUTALBITAL-APAP-CAFFEINE 50-325-40 MG PO TABS: 1.0000 | ORAL_TABLET | Freq: Three times a day (TID) | ORAL | 0 refills | Status: AC | PRN

## 2018-07-19 MED ORDER — ACETAMINOPHEN 500 MG PO TABS
1000.0000 mg | ORAL_TABLET | Freq: Once | ORAL | Status: AC
  Administered 2018-07-19: 1000 mg via ORAL
  Filled 2018-07-19: qty 2

## 2018-07-19 NOTE — ED Notes (Signed)
Pt has been having sinus issues the past few days, appears in NAD. No numbness, tingling or slurred speech.

## 2018-07-19 NOTE — ED Triage Notes (Addendum)
Headache since yesterday about 1pm. Denies head injury. Takes Eliquis due to history of stroke. Smile symmetrical, grips equal, leg strength equal.

## 2018-07-19 NOTE — ED Provider Notes (Signed)
[facility] Emergency Department Provider Note   ____________________________________________    I have reviewed the triage vital signs and the nursing notes.   HISTORY  Chief Complaint Headache     HPI Destiny Gilbert is a 78 y.o. female with a history of diabetes and stroke presents today with complaints of headache.  Patient reports 1-1/2 days of headache which has varied in intensity and location.  Primarily frontal headache but now more posterior right.  Denies photophobia.  She is concerned because CVA presented in a similar manner.  She is on Eliquis.  No nausea or vomiting.  No neuro deficits.  No change in vision.  Headache improved briefly with Tylenol  Past Medical History:  Diagnosis Date  . Diabetes mellitus without complication ([place])   . History of peptic ulcer    perforated  . Hyperlipidemia   . Pneumonia 01/23/2015   [facility]     Patient Active Problem List   Diagnosis Date Noted  . Intermittent atrial fibrillation ([place]) 03/29/2018  . Hypothyroidism 01/25/2018  . Hiatal hernia 01/25/2018  . AKI (acute kidney injury) ([place]) 01/20/2018  . Cerebrovascular disease 01/15/2017  . History of CVA (cerebrovascular accident) 01/05/2017  . Hyperplastic colon polyp 12/25/2016  . Occult blood in stools 10/14/2016  . Diabetes ([place]) 12/16/2009  . Asthma 12/21/2008  . Allergic rhinitis 04/16/2008  . Essential (primary) hypertension 05/03/2007  . Mixed hyperlipidemia 07/07/2006  . Peptic ulcer 07/08/2003  . Colon, diverticulosis 07/07/1998    Past Surgical History:  Procedure Laterality Date  . ABDOMINAL HYSTERECTOMY  1994   BSO  . CATARACT EXTRACTION     left eye- 09/2014; righr eye- 08/13/2014  . COLONOSCOPY WITH PROPOFOL N/A 12/23/2016   Procedure: COLONOSCOPY WITH PROPOFOL;  Surgeon: Lollie Sails, MD;  Location: [facility] ENDOSCOPY;  Service: Endoscopy;  Laterality: N/A;  . INSERTION OF CARDIAC MONITOR    . LOOP RECORDER INSERTION N/A  07/29/2017   Procedure: LOOP RECORDER INSERTION;  Surgeon: Isaias Cowman, MD;  Location: [facility];  Service: Cardiovascular;  Laterality: N/A;    Prior to Admission medications   Medication Sig Start Date End Date Taking? Authorizing Provider  acetaminophen (TYLENOL) 500 MG tablet Take 500 mg by mouth every 6 (six) hours as needed.    [provider]  albuterol (PROAIR HFA) 108 (90 BASE) MCG/ACT inhaler Inhale 2 puffs into the lungs every 6 (six) hours as needed. 10/01/12   [provider]  atorvastatin (LIPITOR) 40 MG tablet TAKE 1 TABLET BY MOUTH ONCE DAILY AT  6  PM 03/21/18   Birdie Sons, MD  azelastine (ASTELIN) 0.1 % nasal spray Place 0.3 mLs into both nostrils daily. 09/28/17   [provider]  butalbital-acetaminophen-caffeine (FIORICET, ESGIC) 50-325-40 MG tablet Take 1-2 tablets by mouth every 8 (eight) hours as needed for headache. 07/19/18 07/19/19  Lavonia Drafts, MD  ELIQUIS 5 MG TABS tablet Take 5 mg by mouth every 12 (twelve) hours. 03/10/18   [provider]  Fluticasone-Salmeterol (ADVAIR) 250-50 MCG/DOSE AEPB INHALE 1 DOSE BY MOUTH TWICE DAILY 10/16/17   Birdie Sons, MD  Glucosamine-Chondroit-Vit C-Mn (GLUCOSAMINE CHONDR 1500 COMPLX) CAPS Take 3-4 capsules by mouth daily. Takes 3-4 capsules daily     [provider]  glucose blood (ONE TOUCH ULTRA TEST) test strip USE ONE STRIP TO CHECK GLUCOSE ONCE DAILY 06/08/18   Birdie Sons, MD  hydrocortisone (PROCTOCARE-HC) 2.5 % rectal cream Place 1 application rectally 2 (two) times  daily. 03/29/18   Birdie Sons, MD  levothyroxine (SYNTHROID, LEVOTHROID) 50 MCG tablet TAKE 1 TABLET BY MOUTH ONCE DAILY BEFORE  BREAKFAST 06/23/18   Birdie Sons, MD  metFORMIN (GLUCOPHAGE-XR) 500 MG 24 hr tablet TAKE ONE TABLET BY MOUTH ONCE DAILY FOR  BLOOD  SUGAR 03/18/18   Birdie Sons, MD  nitroGLYCERIN (NITROSTAT) 0.3 MG SL tablet Place 1 tablet (0.3 mg total) under the  tongue every 5 (five) minutes as needed for chest pain. Patient not taking: Reported on 05/10/2018 01/06/17   Henreitta Leber, MD  pantoprazole (PROTONIX) 40 MG tablet TAKE 1 TABLET BY MOUTH ONCE DAILY 30 TO 60 MINUTES PRIOR TO EVENING MEAL 03/09/18   [provider]  potassium chloride (K-DUR) 10 MEQ tablet Take 1 tablet (10 mEq total) by mouth 2 (two) times daily. 05/18/18   Birdie Sons, MD  potassium chloride (K-DUR,KLOR-CON) 10 MEQ tablet TAKE 1 TABLET BY MOUTH TWICE DAILY 05/18/18   Birdie Sons, MD  triamterene-hydrochlorothiazide [facility]) 37.5-25 MG tablet TAKE 1 TABLET BY MOUTH ONCE DAILY 05/25/18   Birdie Sons, MD     Allergies Dipyridamole and Sulfa antibiotics  Family History  Problem Relation Age of Onset  . Lung cancer Mother   . Pancreatic cancer Father   . Hypertension Sister   . Hyperlipidemia Sister   . Breast cancer Neg Hx     Social History Social History   Tobacco Use  . Smoking status: Never Smoker  . Smokeless tobacco: Never Used  Substance Use Topics  . Alcohol use: Yes    Alcohol/week: 4.0 standard drinks    Types: 4 Glasses of wine per week  . Drug use: No    Review of Systems  Constitutional: No fever/chills Eyes: As above ENT: No sore throat. Cardiovascular: Denies chest pain. Respiratory: Denies shortness of breath. Gastrointestinal: No nausea or vomiting Genitourinary: Negative for dysuria. Musculoskeletal: Negative for back pain. Skin: Negative for rash. Neurological: As above   ____________________________________________   PHYSICAL EXAM:  VITAL SIGNS: ED Triage Vitals [07/19/18 1109]  Enc Vitals Group     BP (!) 151/97     Pulse Rate 81     Resp 20     Temp 98.4 F (36.9 C)     Temp Source Oral     SpO2 96 %     Weight 74.8 kg (165 lb)     Height 1.575 m (5\' 2" )     Head Circumference      Peak Flow      Pain Score 7     Pain Loc      Pain Edu?      Excl. in Campus?     Constitutional: Alert and  oriented. No acute distress. Pleasant and interactive Eyes: Conjunctivae are normal.  PERRLA,  EOMI  Nose: No congestion/rhinnorhea. Mouth/Throat: Mucous membranes are moist.   Cardiovascular: Normal rate, regular rhythm. Grossly normal heart sounds.  Good peripheral circulation. Respiratory: Normal respiratory effort.  No retractions. Lungs CTAB. Gastrointestinal: Soft and nontender. No distention.    Musculoskeletal: Warm and well perfused, normal strength in all extremities Neurologic:  Normal speech and language. No gross focal neurologic deficits are appreciated.  Skin:  Skin is warm, dry and intact. No rash noted. Psychiatric: Mood and affect are normal. Speech and behavior are normal.  ____________________________________________   LABS (all labs ordered are listed, but only abnormal results are displayed)  Labs Reviewed  COMPREHENSIVE METABOLIC PANEL - Abnormal; Notable for  the following components:      Result Value   Glucose, Bld 115 (*)    All other components within normal limits  CBC WITH DIFFERENTIAL/PLATELET   ____________________________________________  EKG  ED ECG REPORT I, Lavonia Drafts, the attending physician, personally viewed and interpreted this ECG.  Date: 07/19/2018  Rhythm: normal sinus rhythm QRS Axis: normal Intervals: normal ST/T Wave abnormalities: normal Narrative Interpretation: no evidence of acute ischemia  ____________________________________________  RADIOLOGY  CT head unremarkable ____________________________________________   PROCEDURES  Procedure(s) performed: No  Procedures   Critical Care performed: No ____________________________________________   INITIAL IMPRESSION / ASSESSMENT AND PLAN / ED COURSE  Pertinent labs & imaging results that were available during my care of the patient were reviewed by me and considered in my medical decision making (see chart for details).  Patient presents with 1-1/2 days of  headache which apparently is quite unusual for her.  Review of records demonstrates similar story prior to being diagnosed with CVA via MRI also had normal CT at that time.  Given this we will obtain MRI today  MRI negative for CVA, appropriate for discharge with outpatient follow-up.  Patient reports that Tylenol did help her headache today    ____________________________________________   FINAL CLINICAL IMPRESSION(S) / ED DIAGNOSES  Final diagnoses:  Acute nonintractable headache, unspecified headache type        Note:  This document was prepared using Dragon voice recognition software and may include unintentional dictation errors.   Lavonia Drafts, MD 07/19/18 [id number]

## 2018-07-19 NOTE — ED Notes (Signed)
Pt taken to MRI  

## 2018-07-20 ENCOUNTER — Encounter: Payer: Self-pay | Admitting: Family Medicine

## 2018-07-20 ENCOUNTER — Ambulatory Visit (INDEPENDENT_AMBULATORY_CARE_PROVIDER_SITE_OTHER): Payer: PPO | Admitting: Family Medicine

## 2018-07-20 VITALS — BP 118/70 | HR 88 | Temp 98.5°F | Resp 20 | Wt 168.0 lb

## 2018-07-20 DIAGNOSIS — R059 Cough, unspecified: Secondary | ICD-10-CM

## 2018-07-20 DIAGNOSIS — J0110 Acute frontal sinusitis, unspecified: Secondary | ICD-10-CM | POA: Diagnosis not present

## 2018-07-20 DIAGNOSIS — J40 Bronchitis, not specified as acute or chronic: Secondary | ICD-10-CM | POA: Diagnosis not present

## 2018-07-20 DIAGNOSIS — R05 Cough: Secondary | ICD-10-CM | POA: Diagnosis not present

## 2018-07-20 MED ORDER — FLUTICASONE PROPIONATE 50 MCG/ACT NA SUSP: 2.0000 | Freq: Every day | NASAL | 6 refills | Status: DC

## 2018-07-20 MED ORDER — AMOXICILLIN-POT CLAVULANATE 875-125 MG PO TABS: 1.0000 | ORAL_TABLET | Freq: Two times a day (BID) | ORAL | 0 refills | Status: AC

## 2018-07-20 NOTE — Patient Instructions (Signed)
.   Please bring all of your medications to every appointment so we can make sure that our medication list is the same as yours.   

## 2018-07-20 NOTE — Progress Notes (Signed)
Patient: Destiny Gilbert Female    DOB: [birth year]   78 y.o.   MRN: [id number] Visit Date: 07/20/2018  Today's Provider: Lelon Huh, MD   Chief Complaint  Patient presents with  . Follow-up  . Sinusitis   Subjective:     Sinusitis  This is a new problem. The current episode started 1 to 4 weeks ago (Pt says symptoms first stated about a week ago.  ). The problem has been gradually worsening (Especially in the last two days.) since onset. Associated symptoms include congestion, coughing, headaches, a hoarse voice, shortness of breath, sinus pressure and sneezing. Pertinent negatives include no chills, diaphoresis, ear pain, neck pain, sore throat or swollen glands. Past treatments include acetaminophen. The treatment provided no relief.    Allergies  Allergen Reactions  . Dipyridamole Nausea Only    Headache  . Sulfa Antibiotics Nausea And Vomiting     Current Outpatient Medications:  .  acetaminophen (TYLENOL) 500 MG tablet, Take 500 mg by mouth every 6 (six) hours as needed., Disp: , Rfl:  .  albuterol (PROAIR HFA) 108 (90 BASE) MCG/ACT inhaler, Inhale 2 puffs into the lungs every 6 (six) hours as needed., Disp: , Rfl:  .  atorvastatin (LIPITOR) 40 MG tablet, TAKE 1 TABLET BY MOUTH ONCE DAILY AT  6  PM, Disp: 90 tablet, Rfl: 4 .  azelastine (ASTELIN) 0.1 % nasal spray, Place 0.3 mLs into both nostrils daily., Disp: , Rfl: 12 .  butalbital-acetaminophen-caffeine (FIORICET, ESGIC) 50-325-40 MG tablet, Take 1-2 tablets by mouth every 8 (eight) hours as needed for headache., Disp: 20 tablet, Rfl: 0 .  ELIQUIS 5 MG TABS tablet, Take 5 mg by mouth every 12 (twelve) hours., Disp: , Rfl: 11 .  Fluticasone-Salmeterol (ADVAIR) 250-50 MCG/DOSE AEPB, INHALE 1 DOSE BY MOUTH TWICE DAILY, Disp: 60 each, Rfl: 12 .  Glucosamine-Chondroit-Vit C-Mn (GLUCOSAMINE CHONDR 1500 COMPLX) CAPS, Take 3-4 capsules by mouth daily. Takes 3-4 capsules daily , Disp: , Rfl:  .  glucose blood (ONE TOUCH  ULTRA TEST) test strip, USE ONE STRIP TO CHECK GLUCOSE ONCE DAILY, Disp: 75 each, Rfl: 4 .  hydrocortisone (PROCTOCARE-HC) 2.5 % rectal cream, Place 1 application rectally 2 (two) times daily., Disp: 30 g, Rfl: 3 .  levothyroxine (SYNTHROID, LEVOTHROID) 50 MCG tablet, TAKE 1 TABLET BY MOUTH ONCE DAILY BEFORE  BREAKFAST, Disp: 30 tablet, Rfl: 12 .  metFORMIN (GLUCOPHAGE-XR) 500 MG 24 hr tablet, TAKE ONE TABLET BY MOUTH ONCE DAILY FOR  BLOOD  SUGAR, Disp: 30 tablet, Rfl: 12 .  nitroGLYCERIN (NITROSTAT) 0.3 MG SL tablet, Place 1 tablet (0.3 mg total) under the tongue every 5 (five) minutes as needed for chest pain., Disp: 90 tablet, Rfl: 0 .  pantoprazole (PROTONIX) 40 MG tablet, TAKE 1 TABLET BY MOUTH ONCE DAILY 30 TO 60 MINUTES PRIOR TO EVENING MEAL, Disp: , Rfl: 12 .  potassium chloride (K-DUR) 10 MEQ tablet, Take 1 tablet (10 mEq total) by mouth 2 (two) times daily., Disp: 180 tablet, Rfl: 4 .  triamterene-hydrochlorothiazide (MAXZIDE-25) 37.5-25 MG tablet, TAKE 1 TABLET BY MOUTH ONCE DAILY, Disp: 90 tablet, Rfl: 4 .  potassium chloride (K-DUR,KLOR-CON) 10 MEQ tablet, TAKE 1 TABLET BY MOUTH TWICE DAILY, Disp: 180 tablet, Rfl: 4  Review of Systems  Constitutional: Positive for fatigue. Negative for activity change, appetite change, chills, diaphoresis, fever and unexpected weight change.  HENT: Positive for congestion, hoarse voice, postnasal drip, rhinorrhea, sinus pressure, sinus pain and sneezing. Negative for  ear discharge, ear pain, hearing loss, sore throat, tinnitus, trouble swallowing and voice change.   Eyes: Positive for discharge and itching. Negative for photophobia, pain, redness and visual disturbance.  Respiratory: Positive for cough, chest tightness, shortness of breath and wheezing. Negative for apnea, choking and stridor.   Gastrointestinal: Negative.   Musculoskeletal: Negative for neck pain and neck stiffness.  Neurological: Positive for headaches. Negative for dizziness and  light-headedness.  Hematological: Does not bruise/bleed easily.    Social History   Tobacco Use  . Smoking status: Never Smoker  . Smokeless tobacco: Never Used  Substance Use Topics  . Alcohol use: Yes    Alcohol/week: 4.0 standard drinks    Types: 4 Glasses of wine per week      Objective:   BP 118/70 (BP Location: Right Arm, Patient Position: Sitting, Cuff Size: Large)   Pulse 88   Temp 98.5 F (36.9 C) (Oral)   Resp 20   Wt 168 lb (76.2 kg)   BMI 30.73 kg/m  Vitals:   07/20/18 1455  BP: 118/70  Pulse: 88  Resp: 20  Temp: 98.5 F (36.9 C)  TempSrc: Oral  Weight: 168 lb (76.2 kg)     Physical Exam  General Appearance:    Alert, cooperative, no distress  HENT:   bilateral TM normal without fluid or infection, neck without nodes, frontal sinus tender and nasal mucosa pale and congested  Eyes:    PERRL, conjunctiva/corneas clear, EOM's intact       Lungs:     Occasional expiratory wheeze, no rales, , respirations unlabored  Heart:    Regular rate and rhythm  Neurologic:   Awake, alert, oriented x 3. No apparent focal neurological           defect.           Assessment & Plan    1. Cough   2. Acute frontal sinusitis, recurrence not specified  - fluticasone (FLONASE) 50 MCG/ACT nasal spray; Place 2 sprays into both nostrils daily.  Dispense: 16 g; Refill: 6 - amoxicillin-clavulanate (AUGMENTIN) 875-125 MG tablet; Take 1 tablet by mouth 2 (two) times daily for 7 days.  Dispense: 14 tablet; Refill: 0  3. Bronchitis  - amoxicillin-clavulanate (AUGMENTIN) 875-125 MG tablet; Take 1 tablet by mouth 2 (two) times daily for 7 days.  Dispense: 14 tablet; Refill: 0  Call if symptoms change or if not rapidly improving.         Lelon Huh, MD  [facility]

## 2018-07-28 ENCOUNTER — Encounter: Payer: Self-pay | Admitting: Family Medicine

## 2018-07-28 ENCOUNTER — Ambulatory Visit
Admission: RE | Admit: 2018-07-28 | Discharge: 2018-07-28 | Disposition: A | Discharge status: Home / Self Care | Payer: PPO | Attending: Family Medicine | Admitting: Family Medicine

## 2018-07-28 ENCOUNTER — Ambulatory Visit
Admission: RE | Admit: 2018-07-28 | Discharge: 2018-07-28 | Disposition: A | Discharge status: Home / Self Care | Payer: PPO | Source: Ambulatory Visit | Attending: Family Medicine | Admitting: Family Medicine

## 2018-07-28 ENCOUNTER — Ambulatory Visit (INDEPENDENT_AMBULATORY_CARE_PROVIDER_SITE_OTHER): Payer: PPO | Admitting: Family Medicine

## 2018-07-28 VITALS — BP 116/79 | HR 68 | Temp 97.6°F | Resp 20 | Wt 165.0 lb

## 2018-07-28 DIAGNOSIS — R05 Cough: Secondary | ICD-10-CM

## 2018-07-28 DIAGNOSIS — R0600 Dyspnea, unspecified: Secondary | ICD-10-CM

## 2018-07-28 DIAGNOSIS — R0602 Shortness of breath: Secondary | ICD-10-CM | POA: Diagnosis not present

## 2018-07-28 DIAGNOSIS — R059 Cough, unspecified: Secondary | ICD-10-CM

## 2018-07-28 MED ORDER — AZITHROMYCIN 250 MG PO TABS: ORAL_TABLET | ORAL | 0 refills | Status: AC

## 2018-07-28 NOTE — Patient Instructions (Addendum)
Go to the [facility] on [address] for chest Xray  

## 2018-07-28 NOTE — Progress Notes (Signed)
Patient: Destiny Gilbert Female    DOB: 10-03-1940   78 y.o.   MRN: [id number] Visit Date: 07/28/2018  Today's Provider: Lelon Huh, MD   Chief Complaint  Patient presents with  . Cough   Subjective:     Cough  This is a recurrent problem. Episode onset: last seen in the office 8 days ago for cough, sinusitis and bronchitis. Patient was treated with Augmentin and Flonase. The problem has been unchanged. Associated symptoms include hemoptysis, rhinorrhea, shortness of breath and wheezing. Pertinent negatives include no chest pain, chills, fever, headaches or sore throat.   States that sinus headache and head and congestion have mostly resolved. Cough is a little better, but more short of breath. No pains in chest.   Allergies  Allergen Reactions  . Dipyridamole Nausea Only    Headache  . Sulfa Antibiotics Nausea And Vomiting     Current Outpatient Medications:  .  acetaminophen (TYLENOL) 500 MG tablet, Take 500 mg by mouth every 6 (six) hours as needed., Disp: , Rfl:  .  albuterol (PROAIR HFA) 108 (90 BASE) MCG/ACT inhaler, Inhale 2 puffs into the lungs every 6 (six) hours as needed., Disp: , Rfl:  .  atorvastatin (LIPITOR) 40 MG tablet, TAKE 1 TABLET BY MOUTH ONCE DAILY AT  6  PM, Disp: 90 tablet, Rfl: 4 .  azelastine (ASTELIN) 0.1 % nasal spray, Place 0.3 mLs into both nostrils daily., Disp: , Rfl: 12 .  butalbital-acetaminophen-caffeine (FIORICET, ESGIC) 50-325-40 MG tablet, Take 1-2 tablets by mouth every 8 (eight) hours as needed for headache., Disp: 20 tablet, Rfl: 0 .  ELIQUIS 5 MG TABS tablet, Take 5 mg by mouth every 12 (twelve) hours., Disp: , Rfl: 11 .  fluticasone (FLONASE) 50 MCG/ACT nasal spray, Place 2 sprays into both nostrils daily., Disp: 16 g, Rfl: 6 .  Fluticasone-Salmeterol (ADVAIR) 250-50 MCG/DOSE AEPB, INHALE 1 DOSE BY MOUTH TWICE DAILY, Disp: 60 each, Rfl: 12 .  Glucosamine-Chondroit-Vit C-Mn (GLUCOSAMINE CHONDR 1500 COMPLX) CAPS, Take 3-4 capsules  by mouth daily. Takes 3-4 capsules daily , Disp: , Rfl:  .  glucose blood (ONE TOUCH ULTRA TEST) test strip, USE ONE STRIP TO CHECK GLUCOSE ONCE DAILY, Disp: 75 each, Rfl: 4 .  hydrocortisone (PROCTOCARE-HC) 2.5 % rectal cream, Place 1 application rectally 2 (two) times daily., Disp: 30 g, Rfl: 3 .  levothyroxine (SYNTHROID, LEVOTHROID) 50 MCG tablet, TAKE 1 TABLET BY MOUTH ONCE DAILY BEFORE  BREAKFAST, Disp: 30 tablet, Rfl: 12 .  metFORMIN (GLUCOPHAGE-XR) 500 MG 24 hr tablet, TAKE ONE TABLET BY MOUTH ONCE DAILY FOR  BLOOD  SUGAR, Disp: 30 tablet, Rfl: 12 .  nitroGLYCERIN (NITROSTAT) 0.3 MG SL tablet, Place 1 tablet (0.3 mg total) under the tongue every 5 (five) minutes as needed for chest pain., Disp: 90 tablet, Rfl: 0 .  pantoprazole (PROTONIX) 40 MG tablet, TAKE 1 TABLET BY MOUTH ONCE DAILY 30 TO 60 MINUTES PRIOR TO EVENING MEAL, Disp: , Rfl: 12 .  potassium chloride (K-DUR) 10 MEQ tablet, Take 1 tablet (10 mEq total) by mouth 2 (two) times daily., Disp: 180 tablet, Rfl: 4 .  potassium chloride (K-DUR,KLOR-CON) 10 MEQ tablet, TAKE 1 TABLET BY MOUTH TWICE DAILY, Disp: 180 tablet, Rfl: 4 .  triamterene-hydrochlorothiazide (MAXZIDE-25) 37.5-25 MG tablet, TAKE 1 TABLET BY MOUTH ONCE DAILY, Disp: 90 tablet, Rfl: 4  Review of Systems  Constitutional: Positive for fatigue. Negative for appetite change, chills and fever.  HENT: Positive for rhinorrhea and  sneezing. Negative for sore throat.   Respiratory: Positive for cough (blood and clear colored phlegm), hemoptysis, shortness of breath and wheezing. Negative for chest tightness.   Cardiovascular: Negative for chest pain and palpitations.  Gastrointestinal: Negative for abdominal pain, nausea and vomiting.  Neurological: Negative for dizziness, weakness and headaches.    Social History   Tobacco Use  . Smoking status: Never Smoker  . Smokeless tobacco: Never Used  Substance Use Topics  . Alcohol use: Yes    Alcohol/week: 4.0 standard drinks      Types: 4 Glasses of wine per week      Objective:   BP 116/79 (BP Location: Left Arm, Patient Position: Sitting, Cuff Size: Large)   Pulse 68   Temp 97.6 F (36.4 C) (Oral)   Resp 20 Comment: labored  Wt 165 lb (74.8 kg)   SpO2 96% Comment: room air  BMI 30.18 kg/m  Vitals:   07/28/18 1051  BP: 116/79  Pulse: 68  Resp: 20  Temp: 97.6 F (36.4 C)  TempSrc: Oral  SpO2: 96%  Weight: 165 lb (74.8 kg)     Physical Exam  General Appearance:    Alert, cooperative, no distress  HENT:   ENT exam normal, no neck nodes or sinus tenderness  Eyes:    PERRL, conjunctiva/corneas clear, EOM's intact       Lungs:     Occasional expiratory wheeze, no rales, respirations unlabored  Heart:    Regular rate and rhythm  Neurologic:   Awake, alert, oriented x 3. No apparent focal neurological           defect.       CXR WNL    Assessment & Plan    1. Cough  - DG Chest 2 View; Future  2. Dyspnea, unspecified type  - DG Chest 2 View; Future Call if symptoms change or if not rapidly improving.        Lelon Huh, MD  [facility]

## 2018-08-16 ENCOUNTER — Ambulatory Visit
Admission: RE | Admit: 2018-08-16 | Discharge: 2018-08-16 | Disposition: A | Discharge status: Home / Self Care | Payer: PPO | Source: Ambulatory Visit | Attending: Family Medicine | Admitting: Family Medicine

## 2018-08-16 DIAGNOSIS — I63532 Cerebral infarction due to unspecified occlusion or stenosis of left posterior cerebral artery: Secondary | ICD-10-CM | POA: Diagnosis not present

## 2018-08-16 DIAGNOSIS — Z1231 Encounter for screening mammogram for malignant neoplasm of breast: Secondary | ICD-10-CM | POA: Diagnosis not present

## 2018-08-23 DIAGNOSIS — E119 Type 2 diabetes mellitus without complications: Secondary | ICD-10-CM | POA: Diagnosis not present

## 2018-08-23 LAB — HM DIABETES EYE EXAM

## 2018-08-24 ENCOUNTER — Encounter: Payer: Self-pay | Admitting: Family Medicine

## 2018-08-30 ENCOUNTER — Ambulatory Visit (INDEPENDENT_AMBULATORY_CARE_PROVIDER_SITE_OTHER): Payer: PPO | Admitting: Family Medicine

## 2018-08-30 ENCOUNTER — Encounter: Payer: Self-pay | Admitting: Family Medicine

## 2018-08-30 VITALS — BP 134/66 | HR 64 | Temp 98.2°F | Resp 16 | Wt 168.0 lb

## 2018-08-30 DIAGNOSIS — E782 Mixed hyperlipidemia: Secondary | ICD-10-CM | POA: Diagnosis not present

## 2018-08-30 DIAGNOSIS — E119 Type 2 diabetes mellitus without complications: Secondary | ICD-10-CM | POA: Diagnosis not present

## 2018-08-30 DIAGNOSIS — I10 Essential (primary) hypertension: Secondary | ICD-10-CM

## 2018-08-30 DIAGNOSIS — E039 Hypothyroidism, unspecified: Secondary | ICD-10-CM | POA: Diagnosis not present

## 2018-08-30 LAB — POCT GLYCOSYLATED HEMOGLOBIN (HGB A1C): Hemoglobin A1C: 6.5 % — AB (ref 4.0–5.6)

## 2018-08-30 MED ORDER — NITROGLYCERIN 0.3 MG SL SUBL: 0.3000 mg | SUBLINGUAL_TABLET | SUBLINGUAL | 0 refills | Status: DC | PRN

## 2018-08-30 MED ORDER — NITROGLYCERIN 0.3 MG SL SUBL: 0.3000 mg | SUBLINGUAL_TABLET | SUBLINGUAL | 0 refills | Status: AC | PRN

## 2018-08-30 MED ORDER — ALBUTEROL SULFATE HFA 108 (90 BASE) MCG/ACT IN AERS: 2.0000 | INHALATION_SPRAY | Freq: Four times a day (QID) | RESPIRATORY_TRACT | 2 refills | Status: AC | PRN

## 2018-08-30 NOTE — Progress Notes (Signed)
Patient: Destiny Gilbert Female    DOB: 1940-08-29   78 y.o.   MRN: [id number] Visit Date: 08/30/2018  Today's Provider: Lelon Huh, MD   Chief Complaint  Patient presents with  . Diabetes  . Hypertension  . Hypothyroidism  . Hyperlipidemia   Subjective:     Thyroid Problem  Presents for follow-up visit. Patient reports no anxiety, cold intolerance, constipation, diaphoresis, diarrhea, fatigue, hair loss, heat intolerance, hoarse voice, leg swelling, visual change, weight gain or weight loss. The symptoms have been stable.    Lab Results  Component Value Date   TSH 11.020 (H) 01/20/2018  Was last checked during hospitalization. No dose changes were made at that time.     Diabetes Mellitus Type II, Follow-up:   Lab Results  Component Value Date   HGBA1C 6.3 (A) 03/29/2018   HGBA1C 6.4 (A) 11/24/2017   HGBA1C 6.3 05/27/2017   Last seen for diabetes 5 months ago.  Management since then includes no changes. She reports excellent compliance with treatment, although she has been eating out more lately.  .  Current symptoms include none and have been stable. Home blood sugar records: fasting range: 120-140's  Episodes of hypoglycemia? no   Most Recent Eye Exam: 08/2018 Weight trend: stable  Current diet: in general, a "healthy" diet   Current exercise: walking  ------------------------------------------------------------------------   Hypertension, follow-up:  BP Readings from Last 3 Encounters:  08/30/18 134/66  07/28/18 116/79  07/20/18 118/70    She was last seen for hypertension 5 months ago.  BP at that visit was  130/76. Management since that visit includes No changes She reports excellent compliance with treatment. She is not having side effects.  She is exercising. She is adherent to low salt diet.   Outside blood pressures are no being checked. She is experiencing none.  Patient denies chest pain, fatigue, irregular heart beat, lower  extremity edema, near-syncope and palpitations.   Cardiovascular risk factors include advanced age (older than 28 for men, 13 for women), diabetes mellitus, dyslipidemia and hypertension.  Use of agents associated with hypertension: none.   ------------------------------------------------------------------------    Lipid/Cholesterol, Follow-up:   Last seen for this 7 months ago.  Management since that visit includes No changes.  Last Lipid Panel:    Component Value Date/Time   CHOL 190 01/04/2018 1131   TRIG 113 01/04/2018 1131   HDL 69 01/04/2018 1131   CHOLHDL 2.8 01/04/2018 1131   CHOLHDL 3.3 01/06/2017 0448   VLDL 44 (H) 01/06/2017 0448   LDLCALC 98 01/04/2018 1131    She reports excellent compliance with treatment. She is not having side effects.   Wt Readings from Last 3 Encounters:  08/30/18 168 lb (76.2 kg)  07/28/18 165 lb (74.8 kg)  07/20/18 168 lb (76.2 kg)    ------------------------------------------------------------------------     Allergies  Allergen Reactions  . Dipyridamole Nausea Only    Headache  . Sulfa Antibiotics Nausea And Vomiting     Current Outpatient Medications:  .  acetaminophen (TYLENOL) 500 MG tablet, Take 500 mg by mouth every 6 (six) hours as needed., Disp: , Rfl:  .  albuterol (PROAIR HFA) 108 (90 BASE) MCG/ACT inhaler, Inhale 2 puffs into the lungs every 6 (six) hours as needed., Disp: , Rfl:  .  atorvastatin (LIPITOR) 40 MG tablet, TAKE 1 TABLET BY MOUTH ONCE DAILY AT  6  PM, Disp: 90 tablet, Rfl: 4 .  butalbital-acetaminophen-caffeine (FIORICET, ESGIC) 50-325-40 MG tablet,  Take 1-2 tablets by mouth every 8 (eight) hours as needed for headache., Disp: 20 tablet, Rfl: 0 .  ELIQUIS 5 MG TABS tablet, Take 5 mg by mouth every 12 (twelve) hours., Disp: , Rfl: 11 .  fluticasone (FLONASE) 50 MCG/ACT nasal spray, Place 2 sprays into both nostrils daily., Disp: 16 g, Rfl: 6 .  Fluticasone-Salmeterol (ADVAIR) 250-50 MCG/DOSE AEPB,  INHALE 1 DOSE BY MOUTH TWICE DAILY, Disp: 60 each, Rfl: 12 .  Glucosamine-Chondroit-Vit C-Mn (GLUCOSAMINE CHONDR 1500 COMPLX) CAPS, Take 3-4 capsules by mouth daily. Takes 3-4 capsules daily , Disp: , Rfl:  .  glucose blood (ONE TOUCH ULTRA TEST) test strip, USE ONE STRIP TO CHECK GLUCOSE ONCE DAILY, Disp: 75 each, Rfl: 4 .  hydrocortisone (PROCTOCARE-HC) 2.5 % rectal cream, Place 1 application rectally 2 (two) times daily., Disp: 30 g, Rfl: 3 .  levothyroxine (SYNTHROID, LEVOTHROID) 50 MCG tablet, TAKE 1 TABLET BY MOUTH ONCE DAILY BEFORE  BREAKFAST, Disp: 30 tablet, Rfl: 12 .  metFORMIN (GLUCOPHAGE-XR) 500 MG 24 hr tablet, TAKE ONE TABLET BY MOUTH ONCE DAILY FOR  BLOOD  SUGAR, Disp: 30 tablet, Rfl: 12 .  nitroGLYCERIN (NITROSTAT) 0.3 MG SL tablet, Place 1 tablet (0.3 mg total) under the tongue every 5 (five) minutes as needed for chest pain., Disp: 90 tablet, Rfl: 0 .  potassium chloride (K-DUR) 10 MEQ tablet, Take 1 tablet (10 mEq total) by mouth 2 (two) times daily., Disp: 180 tablet, Rfl: 4 .  triamterene-hydrochlorothiazide (MAXZIDE-25) 37.5-25 MG tablet, TAKE 1 TABLET BY MOUTH ONCE DAILY, Disp: 90 tablet, Rfl: 4 .  azelastine (ASTELIN) 0.1 % nasal spray, Place 0.3 mLs into both nostrils daily., Disp: , Rfl: 12 .  pantoprazole (PROTONIX) 40 MG tablet, TAKE 1 TABLET BY MOUTH ONCE DAILY 30 TO 60 MINUTES PRIOR TO EVENING MEAL, Disp: , Rfl: 12  Review of Systems  Constitutional: Negative.  Negative for diaphoresis, fatigue, weight gain and weight loss.  HENT: Negative for hoarse voice.   Respiratory: Positive for wheezing. Negative for apnea, cough, choking, chest tightness, shortness of breath (Occasionally; pt states this is in good control) and stridor.   Cardiovascular: Negative.   Gastrointestinal: Negative.  Negative for constipation and diarrhea.  Endocrine: Negative.  Negative for cold intolerance and heat intolerance.  Neurological: Negative for dizziness, light-headedness and headaches.   Psychiatric/Behavioral: The patient is not nervous/anxious.     Social History   Tobacco Use  . Smoking status: Never Smoker  . Smokeless tobacco: Never Used  Substance Use Topics  . Alcohol use: Yes    Alcohol/week: 4.0 standard drinks    Types: 4 Glasses of wine per week      Objective:   BP 134/66 (BP Location: Right Arm, Patient Position: Sitting, Cuff Size: Normal)   Pulse 64   Temp 98.2 F (36.8 C) (Oral)   Resp 16   Wt 168 lb (76.2 kg)   BMI 30.73 kg/m    Physical Exam   General Appearance:    Alert, cooperative, no distress  Eyes:    PERRL, conjunctiva/corneas clear, EOM's intact       Lungs:     Clear to auscultation bilaterally, respirations unlabored  Heart:    Regular rate and rhythm  Neurologic:   Awake, alert, oriented x 3. No apparent focal neurological           defect.       Results for orders placed or performed in visit on 08/30/18  POCT glycosylated hemoglobin (Hb A1C)  Result Value Ref Range   Hemoglobin A1C 6.5 (A) 4.0 - 5.6 %       Assessment & Plan    1. Essential (primary) hypertension Well controlled.  Continue current medications.    2. Type 2 diabetes mellitus without complication, unspecified whether long term insulin use ([place]) Well controlled with diet.  Continue current medications.   - POCT glycosylated hemoglobin (Hb A1C)  3. Mixed hyperlipidemia She is tolerating atorvastatin well with no adverse effects.    4. Hypothyroidism, unspecified type Due to check- TSH  Follow up for AWV/CPE in 4-5 months.        Lelon Huh, MD  [facility]

## 2018-08-30 NOTE — Patient Instructions (Signed)
.   Please review the attached list of medications and notify my office if there are any errors.   . Please bring all of your medications to every appointment so we can make sure that our medication list is the same as yours.   

## 2018-08-31 ENCOUNTER — Telehealth: Payer: Self-pay

## 2018-08-31 LAB — TSH: TSH: 0.83 u[IU]/mL (ref 0.450–4.500)

## 2018-08-31 NOTE — Telephone Encounter (Signed)
Pt advised.   Thanks,   -Winta Barcelo  

## 2018-08-31 NOTE — Telephone Encounter (Signed)
-----   Message from Birdie Sons, MD sent at 08/31/2018  7:33 AM EST ----- Thyroid function is normal. Continue current dose of levothyroxine

## 2018-10-12 DIAGNOSIS — I63532 Cerebral infarction due to unspecified occlusion or stenosis of left posterior cerebral artery: Secondary | ICD-10-CM | POA: Diagnosis not present

## 2018-10-25 ENCOUNTER — Other Ambulatory Visit: Payer: Self-pay | Admitting: Family Medicine

## 2018-11-25 DIAGNOSIS — I10 Essential (primary) hypertension: Secondary | ICD-10-CM | POA: Diagnosis not present

## 2018-11-25 DIAGNOSIS — I639 Cerebral infarction, unspecified: Secondary | ICD-10-CM | POA: Diagnosis not present

## 2018-11-25 DIAGNOSIS — I4891 Unspecified atrial fibrillation: Secondary | ICD-10-CM | POA: Diagnosis not present

## 2018-11-25 DIAGNOSIS — I63532 Cerebral infarction due to unspecified occlusion or stenosis of left posterior cerebral artery: Secondary | ICD-10-CM | POA: Diagnosis not present

## 2018-11-25 DIAGNOSIS — E7800 Pure hypercholesterolemia, unspecified: Secondary | ICD-10-CM | POA: Diagnosis not present

## 2018-11-25 DIAGNOSIS — Z95818 Presence of other cardiac implants and grafts: Secondary | ICD-10-CM | POA: Diagnosis not present

## 2018-11-25 DIAGNOSIS — R079 Chest pain, unspecified: Secondary | ICD-10-CM | POA: Diagnosis not present

## 2018-12-02 DIAGNOSIS — I63532 Cerebral infarction due to unspecified occlusion or stenosis of left posterior cerebral artery: Secondary | ICD-10-CM | POA: Diagnosis not present

## 2018-12-03 ENCOUNTER — Telehealth: Payer: Self-pay

## 2018-12-03 MED ORDER — NITROFURANTOIN MONOHYD MACRO 100 MG PO CAPS: 100.0000 mg | ORAL_CAPSULE | Freq: Two times a day (BID) | ORAL | 0 refills | Status: AC

## 2018-12-03 NOTE — Telephone Encounter (Signed)
Have sent prescription nitrofurantoin to [facility]

## 2018-12-03 NOTE — Telephone Encounter (Signed)
Patient states that she is having some burning with urination and pain. She states she gets cystitis twice a year and feels as if she is having a flare-up. She has appointment scheduled on Tuesday.

## 2018-12-03 NOTE — Telephone Encounter (Signed)
Pt advised.   Thanks,   -Treesa Mccully  

## 2018-12-07 ENCOUNTER — Ambulatory Visit (INDEPENDENT_AMBULATORY_CARE_PROVIDER_SITE_OTHER): Payer: PPO | Admitting: Family Medicine

## 2018-12-07 ENCOUNTER — Other Ambulatory Visit: Payer: Self-pay

## 2018-12-07 ENCOUNTER — Encounter: Payer: Self-pay | Admitting: Family Medicine

## 2018-12-07 VITALS — BP 120/70 | HR 77 | Temp 98.5°F | Resp 16 | Wt 168.0 lb

## 2018-12-07 DIAGNOSIS — N390 Urinary tract infection, site not specified: Secondary | ICD-10-CM

## 2018-12-07 LAB — POCT URINALYSIS DIPSTICK
Bilirubin, UA: NEGATIVE
Glucose, UA: NEGATIVE
Ketones, UA: NEGATIVE
Nitrite, UA: NEGATIVE
Protein, UA: NEGATIVE
Spec Grav, UA: 1.015 (ref 1.010–1.025)
Urobilinogen, UA: 0.2 E.U./dL
pH, UA: 6 (ref 5.0–8.0)

## 2018-12-07 NOTE — Patient Instructions (Signed)
.   Please review the attached list of medications and notify my office if there are any errors.    Please bring all of your medications to every appointment so we can make sure that our medication list is the same as yours.    Put metformin on hold for now. Let me know if you start seeing sugars consistently over 200.

## 2018-12-07 NOTE — Progress Notes (Signed)
Patient: Destiny Gilbert Female    DOB: Oct 22, 1940   78 y.o.   MRN: [id number] Visit Date: 12/07/2018  Today's Provider: Lelon Huh, MD   Chief Complaint  Patient presents with  . Dysuria    x 5 days   Subjective:     Dysuria   This is a new problem. Episode onset: 5 days ago. The problem has been gradually improving. The quality of the pain is described as burning. Associated symptoms include chills, frequency, nausea and urgency. Pertinent negatives include no vomiting. She has tried antibiotics and acetaminophen (patient was prescribed Nitrofuratonin on  12/03/2018) for the symptoms.  she states sx are about 80% better since starting antibiotic.   Allergies  Allergen Reactions  . Dipyridamole Nausea Only    Headache  . Sulfa Antibiotics Nausea And Vomiting     Current Outpatient Medications:  .  acetaminophen (TYLENOL) 500 MG tablet, Take 500 mg by mouth every 6 (six) hours as needed., Disp: , Rfl:  .  ADVAIR DISKUS 250-50 MCG/DOSE AEPB, INHALE 1 DOSE BY MOUTH TWICE DAILY, Disp: 180 each, Rfl: 4 .  albuterol (PROAIR HFA) 108 (90 Base) MCG/ACT inhaler, Inhale 2 puffs into the lungs every 6 (six) hours as needed., Disp: 1 Inhaler, Rfl: 2 .  atorvastatin (LIPITOR) 40 MG tablet, TAKE 1 TABLET BY MOUTH ONCE DAILY AT  6  PM, Disp: 90 tablet, Rfl: 4 .  butalbital-acetaminophen-caffeine (FIORICET, ESGIC) 50-325-40 MG tablet, Take 1-2 tablets by mouth every 8 (eight) hours as needed for headache., Disp: 20 tablet, Rfl: 0 .  ELIQUIS 5 MG TABS tablet, Take 5 mg by mouth every 12 (twelve) hours., Disp: , Rfl: 11 .  fluticasone (FLONASE) 50 MCG/ACT nasal spray, Place 2 sprays into both nostrils daily., Disp: 16 g, Rfl: 6 .  Glucosamine-Chondroit-Vit C-Mn (GLUCOSAMINE CHONDR 1500 COMPLX) CAPS, Take 3-4 capsules by mouth daily. Takes 3-4 capsules daily , Disp: , Rfl:  .  glucose blood (ONE TOUCH ULTRA TEST) test strip, USE ONE STRIP TO CHECK GLUCOSE ONCE DAILY, Disp: 75 each, Rfl: 4  .  hydrocortisone (PROCTOCARE-HC) 2.5 % rectal cream, Place 1 application rectally 2 (two) times daily., Disp: 30 g, Rfl: 3 .  levothyroxine (SYNTHROID, LEVOTHROID) 50 MCG tablet, TAKE 1 TABLET BY MOUTH ONCE DAILY BEFORE  BREAKFAST, Disp: 30 tablet, Rfl: 12 .  metFORMIN (GLUCOPHAGE-XR) 500 MG 24 hr tablet, TAKE ONE TABLET BY MOUTH ONCE DAILY FOR  BLOOD  SUGAR, Disp: 30 tablet, Rfl: 12 .  nitrofurantoin, macrocrystal-monohydrate, (MACROBID) 100 MG capsule, Take 1 capsule (100 mg total) by mouth 2 (two) times daily for 5 days., Disp: 10 capsule, Rfl: 0 .  nitroGLYCERIN (NITROSTAT) 0.3 MG SL tablet, Place 1 tablet (0.3 mg total) under the tongue every 5 (five) minutes as needed for chest pain., Disp: 30 tablet, Rfl: 0 .  potassium chloride (K-DUR) 10 MEQ tablet, Take 1 tablet (10 mEq total) by mouth 2 (two) times daily., Disp: 180 tablet, Rfl: 4 .  triamterene-hydrochlorothiazide (MAXZIDE-25) 37.5-25 MG tablet, TAKE 1 TABLET BY MOUTH ONCE DAILY, Disp: 90 tablet, Rfl: 4  Review of Systems  Constitutional: Positive for chills and fever (up to 101). Negative for appetite change and fatigue.  Respiratory: Negative for chest tightness and shortness of breath.   Cardiovascular: Negative for chest pain and palpitations.  Gastrointestinal: Positive for nausea. Negative for abdominal pain and vomiting.  Genitourinary: Positive for dysuria, frequency and urgency.  Neurological: Positive for headaches. Negative for dizziness and  weakness.    Social History   Tobacco Use  . Smoking status: Never Smoker  . Smokeless tobacco: Never Used  Substance Use Topics  . Alcohol use: Yes    Alcohol/week: 4.0 standard drinks    Types: 4 Glasses of wine per week      Objective:   BP 120/70 (BP Location: Left Arm, Patient Position: Sitting, Cuff Size: Large)   Pulse 77   Temp 98.5 F (36.9 C) (Oral)   Resp 16   Wt 168 lb (76.2 kg)   SpO2 97% Comment: room air  BMI 30.73 kg/m  Vitals:   12/07/18 0842  BP:  120/70  Pulse: 77  Resp: 16  Temp: 98.5 F (36.9 C)  TempSrc: Oral  SpO2: 97%  Weight: 168 lb (76.2 kg)     Physical Exam   General appearance: alert, well developed, well nourished, cooperative and in no distress Head: Normocephalic, without obvious abnormality, atraumatic Respiratory: Respirations even and unlabored, normal respiratory rate Extremities: No gross deformities Skin: Skin color, texture, turgor normal. No rashes seen  Psych: Appropriate mood and affect. Neurologic: Mental status: Alert, oriented to person, place, and time, thought content appropriate.  Results for orders placed or performed in visit on 12/07/18  POCT Urinalysis Dipstick  Result Value Ref Range   Color, UA yellow    Clarity, UA cloudy    Glucose, UA Negative Negative   Bilirubin, UA negative    Ketones, UA negative    Spec Grav, UA 1.015 1.010 - 1.025   Blood, UA Trace (non-hemolyzed)    pH, UA 6.0 5.0 - 8.0   Protein, UA Negative Negative   Urobilinogen, UA 0.2 0.2 or 1.0 E.U./dL   Nitrite, UA negative    Leukocytes, UA Moderate (2+) (A) Negative   Appearance     Odor          Assessment & Plan    1. Urinary tract infection without hematuria, site unspecified Symptomatically improved in nitrofurantoin which she is to finish while waiting for culture results.  - POCT Urinalysis Dipstick - Urine Culture     Lelon Huh, MD  [facility]

## 2018-12-09 LAB — URINE CULTURE

## 2018-12-21 ENCOUNTER — Telehealth: Payer: Self-pay | Admitting: Family Medicine

## 2018-12-21 NOTE — Telephone Encounter (Signed)
[facility] called stating they had in their first visit with her today for in home care.  They are providing routine visit and urgent in home visits.  Pt is stable and well  This is just an [employer]

## 2018-12-22 ENCOUNTER — Telehealth: Payer: Self-pay | Admitting: Family Medicine

## 2018-12-22 NOTE — Chronic Care Management (AMB) (Signed)
Chronic Care Management   Note  12/22/2018 Name: Destiny Gilbert MRN: [id number] DOB: 08-27-1940  Destiny Gilbert is a 78 y.o. year old female who is a primary care patient of Fisher, Kirstie Peri, MD. I reached out to Otelia Santee by phone today in response to a referral sent by Destiny Gilbert's health plan.    [name] was given information about Chronic Care Management services today including:  1. CCM service includes personalized support from designated clinical staff supervised by her physician, including individualized plan of care and coordination with other care providers 2. 24/7 contact phone numbers for assistance for urgent and routine care needs. 3. Service will only be billed when office clinical staff spend 20 minutes or more in a month to coordinate care. 4. Only one practitioner may furnish and bill the service in a calendar month. 5. The patient may stop CCM services at any time (effective at the end of the month) by phone call to the office staff. 6. The patient will be responsible for cost sharing (co-pay) of up to 20% of the service fee (after annual deductible is met).  Patient agreed to services and verbal consent obtained.   Follow up plan: Telephone appointment with CCM team member scheduled for: 12/30/2018  [place]  ??[email or web address]   ??[phone]

## 2018-12-30 ENCOUNTER — Telehealth: Payer: PPO

## 2018-12-30 ENCOUNTER — Ambulatory Visit: Payer: Self-pay

## 2018-12-30 DIAGNOSIS — I10 Essential (primary) hypertension: Secondary | ICD-10-CM

## 2018-12-30 DIAGNOSIS — E119 Type 2 diabetes mellitus without complications: Secondary | ICD-10-CM

## 2018-12-30 NOTE — Chronic Care Management (AMB) (Signed)
Chronic Care Management   Unsuccessful Call Note 12/30/2018 Name: Destiny Gilbert MRN: [id number] DOB: 31-Mar-1941  Destiny Gilbert is a 78 y.o. year old female who is a primary care patient of Sherrie Mustache, Demetrios Isaacs, MD. She was referred to the Chronic Case Management team by her health plan. She consented to services and agreed to initial assessment telephone appointment at 11:00 today.   Was unable to reach patient via telephone today for initial RN CM assessment as previously scheduled. I have left HIPAA compliant voicemail asking patient to return my call. (unsuccessful outreach #1).   Plan: Will follow-up within 7 business days via telephone.    Rosine Solecki E. Suzie Portela, RN, BSN Nurse Care Coordinator [facility] [phone]

## 2019-01-06 ENCOUNTER — Ambulatory Visit: Payer: Self-pay

## 2019-01-06 ENCOUNTER — Other Ambulatory Visit: Payer: Self-pay

## 2019-01-06 DIAGNOSIS — I10 Essential (primary) hypertension: Secondary | ICD-10-CM

## 2019-01-06 DIAGNOSIS — E119 Type 2 diabetes mellitus without complications: Secondary | ICD-10-CM

## 2019-01-06 NOTE — Chronic Care Management (AMB) (Signed)
Chronic Care Management   Note  01/06/2019 Name: Destiny Gilbert MRN: [id number] DOB: [birth year]  Care Coordination: Successful telephone encounter to Destiny Gilbert to reschedule missed CCM Rn CM initial telephone assessment. Destiny Gilbert agrees to rescheduling and request RN CM contact her on her cell.  Plan: Initial assessment rescheduled for 7/14 at 3:00  Daliya Parchment E. Suzie Portela, RN, BSN Nurse Care Coordinator [facility] [phone]

## 2019-01-10 ENCOUNTER — Other Ambulatory Visit: Payer: Self-pay

## 2019-01-10 ENCOUNTER — Ambulatory Visit (INDEPENDENT_AMBULATORY_CARE_PROVIDER_SITE_OTHER): Payer: PPO | Admitting: Family Medicine

## 2019-01-10 ENCOUNTER — Encounter: Payer: Self-pay | Admitting: Family Medicine

## 2019-01-10 ENCOUNTER — Ambulatory Visit (INDEPENDENT_AMBULATORY_CARE_PROVIDER_SITE_OTHER): Payer: PPO

## 2019-01-10 VITALS — BP 128/64 | HR 76 | Temp 98.7°F | Resp 16 | Ht 62.0 in | Wt 169.0 lb

## 2019-01-10 DIAGNOSIS — I480 Paroxysmal atrial fibrillation: Secondary | ICD-10-CM | POA: Diagnosis not present

## 2019-01-10 DIAGNOSIS — E039 Hypothyroidism, unspecified: Secondary | ICD-10-CM | POA: Diagnosis not present

## 2019-01-10 DIAGNOSIS — Z0000 Encounter for general adult medical examination without abnormal findings: Secondary | ICD-10-CM

## 2019-01-10 DIAGNOSIS — E119 Type 2 diabetes mellitus without complications: Secondary | ICD-10-CM | POA: Diagnosis not present

## 2019-01-10 DIAGNOSIS — I10 Essential (primary) hypertension: Secondary | ICD-10-CM

## 2019-01-10 DIAGNOSIS — E782 Mixed hyperlipidemia: Secondary | ICD-10-CM

## 2019-01-10 NOTE — Assessment & Plan Note (Signed)
resolved 

## 2019-01-10 NOTE — Progress Notes (Signed)
Subjective:   Destiny Gilbert is a 78 y.o. female who presents for Medicare Annual (Subsequent) preventive examination.    This visit is being conducted through telemedicine due to the COVID-19 pandemic. This patient has given me verbal consent via doximity to conduct this visit, patient states they are participating from their home address. Some vital signs may be absent or patient reported.    Patient identification: identified by name, DOB, and current address  Review of Systems:  N/A  Cardiac Risk Factors include: advanced age (>31men, >44 women);diabetes mellitus;dyslipidemia;hypertension     Objective:     Vitals: There were no vitals taken for this visit.  There is no height or weight on file to calculate BMI. Unable to obtain vitals due to visit being conducted via telephonically.   Advanced Directives 01/10/2019 07/19/2018 05/06/2018 01/20/2018 01/04/2018 07/29/2017 07/29/2017  Does Patient Have a Medical Advance Directive? No No Yes No No No No  Type of Advance Directive - Public librarian;Living will - - - -  Copy of [place] in Chart? - - No - copy requested - - - -  Would patient like information on creating a medical advance directive? No - Patient declined - No - Patient declined No - Patient declined No - Patient declined No - Patient declined No - Patient declined    Tobacco Social History   Tobacco Use  Smoking Status Never Smoker  Smokeless Tobacco Never Used     Counseling given: Not Answered   Clinical Intake:  Pre-visit preparation completed: Yes  Pain Score: 0-No pain     Nutritional Risks: None Diabetes: Yes  How often do you need to have someone help you when you read instructions, pamphlets, or other written materials from your doctor or pharmacy?: 1 - Never   Diabetes:  Is the patient diabetic?  Yes type 2 If diabetic, was a CBG obtained today?  No  Did the patient bring in their glucometer from home?  No   How often do you monitor your CBG's? Once a week.   Financial Strains and Diabetes Management:  Are you having any financial strains with the device, your supplies or your medication? No .  Does the patient want to be seen by Chronic Care Management for management of their diabetes?  No  Would the patient like to be referred to a Nutritionist or for Diabetic Management?  No   Diabetic Exams:  Diabetic Eye Exam: Completed 08/23/18. Repeat yearly.  Diabetic Foot Exam: Pt has been advised about the importance in completing this exam. Note made to follow up on this today.     Interpreter Needed?: No  Information entered by :: [facility], LPN  Past Medical History:  Diagnosis Date  . Diabetes mellitus without complication ([place])   . History of peptic ulcer    perforated  . Hyperlipidemia   . Pneumonia 01/23/2015   [facility]    Past Surgical History:  Procedure Laterality Date  . ABDOMINAL HYSTERECTOMY  1994   BSO  . CATARACT EXTRACTION     left eye- 09/2014; righr eye- 08/13/2014  . COLONOSCOPY WITH PROPOFOL N/A 12/23/2016   Procedure: COLONOSCOPY WITH PROPOFOL;  Surgeon: Lollie Sails, MD;  Location: [facility] ENDOSCOPY;  Service: Endoscopy;  Laterality: N/A;  . INSERTION OF CARDIAC MONITOR    . INSERTION OF CARDIAC MONITOR    . LOOP RECORDER INSERTION N/A 07/29/2017   Procedure: LOOP RECORDER INSERTION;  Surgeon: Isaias Cowman, MD;  Location:  [facility];  Service: Cardiovascular;  Laterality: N/A;   Family History  Problem Relation Age of Onset  . Lung cancer Mother   . Pancreatic cancer Father   . Hypertension Sister   . Hyperlipidemia Sister   . Breast cancer Neg Hx    Social History   Socioeconomic History  . Marital status: Married    Spouse name: Not on file  . Number of children: 3  . Years of education: Not on file  . Highest education level: Some college, no degree  Occupational History  . Occupation: Retired  Scientific laboratory technician  . Financial resource  strain: Not hard at all  . Food insecurity    Worry: Never true    Inability: Never true  . Transportation needs    Medical: No    Non-medical: No  Tobacco Use  . Smoking status: Never Smoker  . Smokeless tobacco: Never Used  Substance and Sexual Activity  . Alcohol use: Yes    Alcohol/week: 4.0 standard drinks    Types: 4 Glasses of wine per week  . Drug use: No  . Sexual activity: Not on file  Lifestyle  . Physical activity    Days per week: 0 days    Minutes per session: 0 min  . Stress: Not at all  Relationships  . Social Herbalist on phone: Patient refused    Gets together: Patient refused    Attends religious service: Patient refused    Active member of club or organization: Patient refused    Attends meetings of clubs or organizations: Patient refused    Relationship status: Patient refused  Other Topics Concern  . Not on file  Social History Narrative  . Not on file    Outpatient Encounter Medications as of 01/10/2019  Medication Sig  . acetaminophen (TYLENOL) 500 MG tablet Take 500 mg by mouth every 6 (six) hours as needed.  Marland Kitchen ADVAIR DISKUS 250-50 MCG/DOSE AEPB INHALE 1 DOSE BY MOUTH TWICE DAILY  . albuterol (PROAIR HFA) 108 (90 Base) MCG/ACT inhaler Inhale 2 puffs into the lungs every 6 (six) hours as needed.  Marland Kitchen atorvastatin (LIPITOR) 40 MG tablet TAKE 1 TABLET BY MOUTH ONCE DAILY AT  6  PM  . butalbital-acetaminophen-caffeine (FIORICET, ESGIC) 50-325-40 MG tablet Take 1-2 tablets by mouth every 8 (eight) hours as needed for headache.  Marland Kitchen ELIQUIS 5 MG TABS tablet Take 5 mg by mouth every 12 (twelve) hours.  . fluticasone (FLONASE) 50 MCG/ACT nasal spray Place 2 sprays into both nostrils daily. (Patient taking differently: Place 2 sprays into both nostrils daily. As needed)  . Glucosamine-Chondroit-Vit C-Mn (GLUCOSAMINE CHONDR 1500 COMPLX) CAPS Take 3-4 capsules by mouth daily. Takes 3-4 capsules daily   . glucose blood (ONE TOUCH ULTRA TEST) test strip  USE ONE STRIP TO CHECK GLUCOSE ONCE DAILY  . hydrocortisone (PROCTOCARE-HC) 2.5 % rectal cream Place 1 application rectally 2 (two) times daily.  Marland Kitchen levothyroxine (SYNTHROID, LEVOTHROID) 50 MCG tablet TAKE 1 TABLET BY MOUTH ONCE DAILY BEFORE  BREAKFAST  . nitroGLYCERIN (NITROSTAT) 0.3 MG SL tablet Place 1 tablet (0.3 mg total) under the tongue every 5 (five) minutes as needed for chest pain.  . potassium chloride (K-DUR) 10 MEQ tablet Take 1 tablet (10 mEq total) by mouth 2 (two) times daily.  Marland Kitchen triamterene-hydrochlorothiazide (MAXZIDE-25) 37.5-25 MG tablet TAKE 1 TABLET BY MOUTH ONCE DAILY   No facility-administered encounter medications on file as of 01/10/2019.     Activities of  Daily Living In your present state of health, do you have any difficulty performing the following activities: 01/10/2019 01/20/2018  Hearing? N N  Vision? N N  Comment Wears eye glasses. -  Difficulty concentrating or making decisions? N N  Walking or climbing stairs? N N  Dressing or bathing? N N  Doing errands, shopping? N N  Preparing Food and eating ? N -  Using the Toilet? N -  In the past six months, have you accidently leaked urine? N -  Do you have problems with loss of bowel control? N -  Managing your Medications? N -  Managing your Finances? N -  Housekeeping or managing your Housekeeping? N -  Some recent data might be hidden    Patient Care Team: Birdie Sons, MD as PCP - General (Family Medicine) [facility], [facility]) Lollie Sails, MD as Consulting Physician (Gastroenterology) Isaias Cowman, MD as Consulting Physician (Cardiology) Anabel Bene, MD as Referring Physician (Neurology) Clyde Canterbury, MD as Referring Physician (Otolaryngology) Benedetto Goad, RN as Registered Nurse    Assessment:   This is a routine wellness examination for Jadelin.  Exercise Activities and Dietary recommendations Current Exercise Habits: The patient does not participate in  regular exercise at present, Exercise limited by: None identified  Goals    . DIET - REDUCE SUGAR INTAKE     Recommend to cut back on sugar and sweets in daily diet and subsitute for healthier snack.     . Exercise 3x per week (30 min per time)     Recommend to exercise for 3 days a week for at least 30 minutes at a time.     . Reduce sugar intake     Recommend decreasing amount of sugar and carbohydrates in daily diet.        Fall Risk: Fall Risk  01/10/2019 01/04/2018 05/27/2017 01/02/2017 05/20/2016  Falls in the past year? 0 No No No No    FALL RISK PREVENTION PERTAINING TO THE HOME:  Any stairs in or around the home? Yes  If so, are there any without handrails? Yes   Home free of loose throw rugs in walkways, pet beds, electrical cords, etc? Yes  Adequate lighting in your home to reduce risk of falls? Yes   ASSISTIVE DEVICES UTILIZED TO PREVENT FALLS:  Life alert? No  Use of a cane, walker or w/c? No  Grab bars in the bathroom? No  Shower chair or bench in shower? No  Elevated toilet seat or a handicapped toilet? Yes   TIMED UP AND GO:  Was the test performed? No .    Depression Screen PHQ 2/9 Scores 01/10/2019 05/06/2018 01/04/2018 05/27/2017  PHQ - 2 Score 0 0 0 0  PHQ- 9 Score - - - 0     Cognitive Function: Declined today.      6CIT Screen 05/27/2017 01/02/2017  What Year? 0 points 0 points  What month? 0 points 0 points  What time? 0 points 0 points  Count back from 20 0 points 0 points  Months in reverse 0 points 0 points  Repeat phrase 2 points 0 points  Total Score 2 0    Immunization History  Administered Date(s) Administered  . Influenza, High Dose Seasonal PF 03/18/2016, 03/27/2017, 03/29/2018  . Influenza,inj,Quad PF,6+ Mos 05/17/2015  . Pneumococcal Conjugate-13 05/20/2016  . Pneumococcal Polysaccharide-23 04/12/2008, 09/12/2013  . Tdap 05/03/2007  . Zoster 06/12/2009    Qualifies for Shingles Vaccine? Yes  Zostavax completed 07/02/09. Due  for Shingrix. Education has been provided regarding the importance of this vaccine. Pt has been advised to call insurance company to determine out of pocket expense. Advised may also receive vaccine at local pharmacy or Health Dept. Verbalized acceptance and understanding.  Tdap: Although this vaccine is not a covered service during a Wellness Exam, does the patient still wish to receive this vaccine today?  No .  Education has been provided regarding the importance of this vaccine. Advised may receive this vaccine at local pharmacy or Health Dept. Aware to provide a copy of the vaccination record if obtained from local pharmacy or Health Dept. Verbalized acceptance and understanding.  Flu Vaccine: Up to date  Pneumococcal Vaccine: Completed series  Screening Tests Health Maintenance  Topic Date Due  . FOOT EXAM  09/23/1950  . TETANUS/TDAP  05/02/2017  . INFLUENZA VACCINE  02/05/2019  . HEMOGLOBIN A1C  02/28/2019  . URINE MICROALBUMIN  03/30/2019  . OPHTHALMOLOGY EXAM  08/24/2019  . COLONOSCOPY  12/24/2019  . DEXA SCAN  02/04/2023  . PNA vac Low Risk Adult  Completed    Cancer Screenings:  Colorectal Screening: Completed 12/23/16. Repeat every 3 years.  Mammogram: Completed 08/16/2018.   Bone Density: Completed 02/03/18. Results reflect NORMAL. No repeat needed.  Lung Cancer Screening: (Low Dose CT Chest recommended if Age 38-80 years, 30 pack-year currently smoking OR have quit w/in 15years.) does not qualify.   Additional Screening:  Dental Screening: Recommended annual dental exams for proper oral hygiene   Community Resource Referral:  CRR required this visit?  No       Plan:  I have personally reviewed and addressed the Medicare Annual Wellness questionnaire and have noted the following in the patient's chart:  A. Medical and social history B. Use of alcohol, tobacco or illicit drugs  C. Current medications and supplements D. Functional ability and status E.   Nutritional status F.  Physical activity G. Advance directives H. List of other physicians I.  Hospitalizations, surgeries, and ER visits in previous 12 months J.  [place] such as hearing and vision if needed, cognitive and depression L. Referrals and appointments   In addition, I have reviewed and discussed with patient certain preventive protocols, quality metrics, and best practice recommendations. A written personalized care plan for preventive services as well as general preventive health recommendations were provided to patient.   Glendora Score, [place]  01/11/6753 Nurse Health Advisor   Nurse Notes: Pt needs a diabetic foot exam at today's visit. Declined tetanus vaccine.

## 2019-01-10 NOTE — Assessment & Plan Note (Signed)
Currently off of metformin due to recall. If aic is up will consider changing to IR formulation

## 2019-01-10 NOTE — Patient Instructions (Signed)
Destiny Gilbert , Thank you for taking time to come for your Medicare Wellness Visit. I appreciate your ongoing commitment to your health goals. Please review the following plan we discussed and let me know if I can assist you in the future.   Screening recommendations/referrals: Colonoscopy: Up to date, due 12/2019 Mammogram: Up to date, due 08/2020 Bone Density: Up to date, last DEXA was normal. No repeat needed. Recommended yearly ophthalmology/optometry visit for glaucoma screening and checkup Recommended yearly dental visit for hygiene and checkup  Vaccinations: Influenza vaccine: Up to date Pneumococcal vaccine: Completed series Tdap vaccine: Pt declines today.  Shingles vaccine: Pt declines today.     Advanced directives: Advance directive discussed with you today. Even though you declined this today please call our office should you change your mind and we can give you the proper paperwork for you to fill out.  Conditions/risks identified: Recommend to start walking 3 days a week for at least 30 minutes at at time.  Next appointment: 10:00 AM in office today with Dr Caryn Section.    Preventive Care 76 Years and Older, Female Preventive care refers to lifestyle choices and visits with your health care provider that can promote health and wellness. What does preventive care include?  A yearly physical exam. This is also called an annual well check.  Dental exams once or twice a year.  Routine eye exams. Ask your health care provider how often you should have your eyes checked.  Personal lifestyle choices, including:  Daily care of your teeth and gums.  Regular physical activity.  Eating a healthy diet.  Avoiding tobacco and drug use.  Limiting alcohol use.  Practicing safe sex.  Taking low-dose aspirin every day.  Taking vitamin and mineral supplements as recommended by your health care provider. What happens during an annual well check? The services and screenings done  by your health care provider during your annual well check will depend on your age, overall health, lifestyle risk factors, and family history of disease. Counseling  Your health care provider may ask you questions about your:  Alcohol use.  Tobacco use.  Drug use.  Emotional well-being.  Home and relationship well-being.  Sexual activity.  Eating habits.  History of falls.  Memory and ability to understand (cognition).  Work and work Statistician.  Reproductive health. Screening  You may have the following tests or measurements:  Height, weight, and BMI.  Blood pressure.  Lipid and cholesterol levels. These may be checked every 5 years, or more frequently if you are over 62 years old.  Skin check.  Lung cancer screening. You may have this screening every year starting at age 69 if you have a 30-pack-year history of smoking and currently smoke or have quit within the past 15 years.  Fecal occult blood test (FOBT) of the stool. You may have this test every year starting at age 20.  Flexible sigmoidoscopy or colonoscopy. You may have a sigmoidoscopy every 5 years or a colonoscopy every 10 years starting at age 58.  Hepatitis C blood test.  Hepatitis B blood test.  Sexually transmitted disease (STD) testing.  Diabetes screening. This is done by checking your blood sugar (glucose) after you have not eaten for a while (fasting). You may have this done every 1-3 years.  Bone density scan. This is done to screen for osteoporosis. You may have this done starting at age 67.  Mammogram. This may be done every 1-2 years. Talk to your health care provider about  how often you should have regular mammograms. Talk with your health care provider about your test results, treatment options, and if necessary, the need for more tests. Vaccines  Your health care provider may recommend certain vaccines, such as:  Influenza vaccine. This is recommended every year.  Tetanus,  diphtheria, and acellular pertussis (Tdap, Td) vaccine. You may need a Td booster every 10 years.  Zoster vaccine. You may need this after age 22.  Pneumococcal 13-valent conjugate (PCV13) vaccine. One dose is recommended after age 84.  Pneumococcal polysaccharide (PPSV23) vaccine. One dose is recommended after age 27. Talk to your health care provider about which screenings and vaccines you need and how often you need them. This information is not intended to replace advice given to you by your health care provider. Make sure you discuss any questions you have with your health care provider. Document Released: 07/20/2015 Document Revised: 03/12/2016 Document Reviewed: 04/24/2015 Elsevier Interactive Patient Education  2017 [employer] Prevention in the Home Falls can cause injuries. They can happen to people of all ages. There are many things you can do to make your home safe and to help prevent falls. What can I do on the outside of my home?  Regularly fix the edges of walkways and driveways and fix any cracks.  Remove anything that might make you trip as you walk through a door, such as a raised step or threshold.  Trim any bushes or trees on the path to your home.  Use bright outdoor lighting.  Clear any walking paths of anything that might make someone trip, such as rocks or tools.  Regularly check to see if handrails are loose or broken. Make sure that both sides of any steps have handrails.  Any raised decks and porches should have guardrails on the edges.  Have any leaves, snow, or ice cleared regularly.  Use sand or salt on walking paths during winter.  Clean up any spills in your garage right away. This includes oil or grease spills. What can I do in the bathroom?  Use night lights.  Install grab bars by the toilet and in the tub and shower. Do not use towel bars as grab bars.  Use non-skid mats or decals in the tub or shower.  If you need to sit down in  the shower, use a plastic, non-slip stool.  Keep the floor dry. Clean up any water that spills on the floor as soon as it happens.  Remove soap buildup in the tub or shower regularly.  Attach bath mats securely with double-sided non-slip rug tape.  Do not have throw rugs and other things on the floor that can make you trip. What can I do in the bedroom?  Use night lights.  Make sure that you have a light by your bed that is easy to reach.  Do not use any sheets or blankets that are too big for your bed. They should not hang down onto the floor.  Have a firm chair that has side arms. You can use this for support while you get dressed.  Do not have throw rugs and other things on the floor that can make you trip. What can I do in the kitchen?  Clean up any spills right away.  Avoid walking on wet floors.  Keep items that you use a lot in easy-to-reach places.  If you need to reach something above you, use a strong step stool that has a grab bar.  Keep  electrical cords out of the way.  Do not use floor polish or wax that makes floors slippery. If you must use wax, use non-skid floor wax.  Do not have throw rugs and other things on the floor that can make you trip. What can I do with my stairs?  Do not leave any items on the stairs.  Make sure that there are handrails on both sides of the stairs and use them. Fix handrails that are broken or loose. Make sure that handrails are as long as the stairways.  Check any carpeting to make sure that it is firmly attached to the stairs. Fix any carpet that is loose or worn.  Avoid having throw rugs at the top or bottom of the stairs. If you do have throw rugs, attach them to the floor with carpet tape.  Make sure that you have a light switch at the top of the stairs and the bottom of the stairs. If you do not have them, ask someone to add them for you. What else can I do to help prevent falls?  Wear shoes that:  Do not have high  heels.  Have rubber bottoms.  Are comfortable and fit you well.  Are closed at the toe. Do not wear sandals.  If you use a stepladder:  Make sure that it is fully opened. Do not climb a closed stepladder.  Make sure that both sides of the stepladder are locked into place.  Ask someone to hold it for you, if possible.  Clearly mark and make sure that you can see:  Any grab bars or handrails.  First and last steps.  Where the edge of each step is.  Use tools that help you move around (mobility aids) if they are needed. These include:  Canes.  Walkers.  Scooters.  Crutches.  Turn on the lights when you go into a dark area. Replace any light bulbs as soon as they burn out.  Set up your furniture so you have a clear path. Avoid moving your furniture around.  If any of your floors are uneven, fix them.  If there are any pets around you, be aware of where they are.  Review your medicines with your doctor. Some medicines can make you feel dizzy. This can increase your chance of falling. Ask your doctor what other things that you can do to help prevent falls. This information is not intended to replace advice given to you by your health care provider. Make sure you discuss any questions you have with your health care provider. Document Released: 04/19/2009 Document Revised: 11/29/2015 Document Reviewed: 07/28/2014 Elsevier Interactive Patient Education  2017 [employer].

## 2019-01-10 NOTE — Patient Instructions (Addendum)
.   Please review the attached list of medications and notify my office if there are any errors.   . Please bring all of your medications to every appointment so we can make sure that our medication list is the same as yours.   . We will have flu vaccines available after Labor Day. Please go to your pharmacy or call the office in early September to schedule you flu shot.   The CDC recommends two doses of Shingrix (the shingles vaccine) separated by 2 to 6 months for adults age 76 years and older. I recommend checking with your pharmacy plan regarding coverage for this vaccine.

## 2019-01-10 NOTE — Progress Notes (Signed)
Patient: Destiny Gilbert, Female    DOB: [birth year], 78 y.o.   MRN: [id number] Visit Date: 01/10/2019  Today's Provider: Lelon Huh, MD   Chief Complaint  Patient presents with  . Annual Exam  . Diabetes  . Hyperlipidemia   Subjective:     Complete Physical Destiny Gilbert is a 78 y.o. female. She feels well. She reports no regular exercising . She reports she is sleeping well.  -----------------------------------------------------------  Diabetes Mellitus Type II, Follow-up:   Lab Results  Component Value Date   HGBA1C 6.5 (A) 08/30/2018   HGBA1C 6.3 (A) 03/29/2018   HGBA1C 6.4 (A) 11/24/2017    Last seen for diabetes 5 months ago.  Management since then includes no changes. She reports good compliance with treatment. She is not having side effects.  Current symptoms include none and have been stable. Home blood sugar records: fasting range: 147- 174  Episodes of hypoglycemia? no   Current insulin regiment: none Most Recent Eye Exam: 08/23/2018 Weight trend: stable Prior visit with dietician: No Current exercise: none Current diet habits: well balanced  Pertinent Labs:    Component Value Date/Time   CHOL 190 01/04/2018 1131   TRIG 113 01/04/2018 1131   HDL 69 01/04/2018 1131   LDLCALC 98 01/04/2018 1131   CREATININE 0.89 07/19/2018 1117   CREATININE 1.18 09/13/2013 0421    Wt Readings from Last 3 Encounters:  01/10/19 169 lb (76.7 kg)  12/07/18 168 lb (76.2 kg)  08/30/18 168 lb (76.2 kg)    ------------------------------------------------------------------------  Lipid/Cholesterol, Follow-up:   Last seen for this 5 months ago.  Management changes since that visit include none. . Last Lipid Panel:    Component Value Date/Time   CHOL 190 01/04/2018 1131   TRIG 113 01/04/2018 1131   HDL 69 01/04/2018 1131   CHOLHDL 2.8 01/04/2018 1131   CHOLHDL 3.3 01/06/2017 0448   VLDL 44 (H) 01/06/2017 0448   LDLCALC 98 01/04/2018 1131    Risk  factors for vascular disease include hypercholesterolemia  She reports good compliance with treatment. She is not having side effects.  Current symptoms include none and have been stable. Weight trend: stable Prior visit with dietician: no Current diet: well balanced Current exercise: none  Wt Readings from Last 3 Encounters:  01/10/19 169 lb (76.7 kg)  12/07/18 168 lb (76.2 kg)  08/30/18 168 lb (76.2 kg)    -------------------------------------------------------------------   Review of Systems  Constitutional: Negative for chills, fatigue and fever.  HENT: Negative for congestion, ear pain, rhinorrhea, sneezing and sore throat.   Eyes: Negative.  Negative for pain and redness.  Respiratory: Negative for cough, shortness of breath and wheezing.   Cardiovascular: Negative for chest pain and leg swelling.  Gastrointestinal: Negative for abdominal pain, blood in stool, constipation, diarrhea and nausea.  Endocrine: Negative for polydipsia and polyphagia.  Genitourinary: Negative.  Negative for dysuria, flank pain, hematuria, pelvic pain, vaginal bleeding and vaginal discharge.  Musculoskeletal: Negative for arthralgias, back pain, gait problem and joint swelling.  Skin: Negative for rash.  Neurological: Negative.  Negative for dizziness, tremors, seizures, weakness, light-headedness, numbness and headaches.  Hematological: Negative for adenopathy.  Psychiatric/Behavioral: Negative.  Negative for behavioral problems, confusion and dysphoric mood. The patient is not nervous/anxious and is not hyperactive.     Social History   Socioeconomic History  . Marital status: Married    Spouse name: Not on file  . Number of children: 3  . Years of  education: Not on file  . Highest education level: Some college, no degree  Occupational History  . Occupation: Retired  Scientific laboratory technician  . Financial resource strain: Not hard at all  . Food insecurity    Worry: Never true    Inability: Never  true  . Transportation needs    Medical: No    Non-medical: No  Tobacco Use  . Smoking status: Never Smoker  . Smokeless tobacco: Never Used  Substance and Sexual Activity  . Alcohol use: Yes    Alcohol/week: 4.0 standard drinks    Types: 4 Glasses of wine per week  . Drug use: No  . Sexual activity: Not on file  Lifestyle  . Physical activity    Days per week: 0 days    Minutes per session: 0 min  . Stress: Not at all  Relationships  . Social Herbalist on phone: Patient refused    Gets together: Patient refused    Attends religious service: Patient refused    Active member of club or organization: Patient refused    Attends meetings of clubs or organizations: Patient refused    Relationship status: Patient refused  . Intimate partner violence    Fear of current or ex partner: Patient refused    Emotionally abused: Patient refused    Physically abused: Patient refused    Forced sexual activity: Patient refused  Other Topics Concern  . Not on file  Social History Narrative  . Not on file    Past Medical History:  Diagnosis Date  . Diabetes mellitus without complication ([place])   . History of peptic ulcer    perforated  . Hyperlipidemia   . Pneumonia 01/23/2015   [facility]      Patient Active Problem List   Diagnosis Date Noted  . Intermittent atrial fibrillation ([place]) 03/29/2018  . Hypothyroidism 01/25/2018  . Hiatal hernia 01/25/2018  . AKI (acute kidney injury) ([place]) 01/20/2018  . Cerebrovascular disease 01/15/2017  . History of CVA (cerebrovascular accident) 01/05/2017  . Hyperplastic colon polyp 12/25/2016  . Occult blood in stools 10/14/2016  . Diabetes ([place]) 12/16/2009  . Asthma 12/21/2008  . Allergic rhinitis 04/16/2008  . Essential (primary) hypertension 05/03/2007  . Mixed hyperlipidemia 07/07/2006  . Peptic ulcer 07/08/2003  . Colon, diverticulosis 07/07/1998    Past Surgical History:  Procedure Laterality Date  . ABDOMINAL HYSTERECTOMY   1994   BSO  . CATARACT EXTRACTION     left eye- 09/2014; righr eye- 08/13/2014  . COLONOSCOPY WITH PROPOFOL N/A 12/23/2016   Procedure: COLONOSCOPY WITH PROPOFOL;  Surgeon: Lollie Sails, MD;  Location[facility] ENDOSCOPY;  Service: Endoscopy;  Laterality: N/A;  . INSERTION OF CARDIAC MONITOR    . INSERTION OF CARDIAC MONITOR    . LOOP RECORDER INSERTION N/A 07/29/2017   Procedure: LOOP RECORDER INSERTION;  Surgeon: Isaias Cowman, MD;  Location: [place] CV LAB;  Service: Cardiovascular;  Laterality: N/A;    Her family history includes Hyperlipidemia in her sister; Hypertension in her sister; Lung cancer in her mother; Pancreatic cancer in her father. There is no history of Breast cancer.   Current Outpatient Medications:  .  acetaminophen (TYLENOL) 500 MG tablet, Take 500 mg by mouth every 6 (six) hours as needed., Disp: , Rfl:  .  ADVAIR DISKUS 250-50 MCG/DOSE AEPB, INHALE 1 DOSE BY MOUTH TWICE DAILY, Disp: 180 each, Rfl: 4 .  albuterol (PROAIR HFA) 108 (90 Base) MCG/ACT inhaler, Inhale 2 puffs into the lungs  every 6 (six) hours as needed., Disp: 1 Inhaler, Rfl: 2 .  atorvastatin (LIPITOR) 40 MG tablet, TAKE 1 TABLET BY MOUTH ONCE DAILY AT  6  PM, Disp: 90 tablet, Rfl: 4 .  butalbital-acetaminophen-caffeine (FIORICET, ESGIC) 50-325-40 MG tablet, Take 1-2 tablets by mouth every 8 (eight) hours as needed for headache., Disp: 20 tablet, Rfl: 0 .  ELIQUIS 5 MG TABS tablet, Take 5 mg by mouth every 12 (twelve) hours., Disp: , Rfl: 11 .  fluticasone (FLONASE) 50 MCG/ACT nasal spray, Place 2 sprays into both nostrils daily. (Patient taking differently: Place 2 sprays into both nostrils daily. As needed), Disp: 16 g, Rfl: 6 .  Glucosamine-Chondroit-Vit C-Mn (GLUCOSAMINE CHONDR 1500 COMPLX) CAPS, Take 3-4 capsules by mouth daily. Takes 3-4 capsules daily , Disp: , Rfl:  .  glucose blood (ONE TOUCH ULTRA TEST) test strip, USE ONE STRIP TO CHECK GLUCOSE ONCE DAILY, Disp: 75 each, Rfl: 4 .   hydrocortisone (PROCTOCARE-HC) 2.5 % rectal cream, Place 1 application rectally 2 (two) times daily., Disp: 30 g, Rfl: 3 .  levothyroxine (SYNTHROID, LEVOTHROID) 50 MCG tablet, TAKE 1 TABLET BY MOUTH ONCE DAILY BEFORE  BREAKFAST, Disp: 30 tablet, Rfl: 12 .  nitroGLYCERIN (NITROSTAT) 0.3 MG SL tablet, Place 1 tablet (0.3 mg total) under the tongue every 5 (five) minutes as needed for chest pain., Disp: 30 tablet, Rfl: 0 .  potassium chloride (K-DUR) 10 MEQ tablet, Take 1 tablet (10 mEq total) by mouth 2 (two) times daily., Disp: 180 tablet, Rfl: 4 .  triamterene-hydrochlorothiazide (MAXZIDE-25) 37.5-25 MG tablet, TAKE 1 TABLET BY MOUTH ONCE DAILY, Disp: 90 tablet, Rfl: 4  Patient Care Team: Birdie Sons, MD as PCP - General (Family Medicine) Pa, [place] (Optometry) Lollie Sails, MD as Consulting Physician (Gastroenterology) Isaias Cowman, MD as Consulting Physician (Cardiology) Anabel Bene, MD as Referring Physician (Neurology) Clyde Canterbury, MD as Referring Physician (Otolaryngology) Benedetto Goad, RN as Registered Nurse     Objective:    Vitals: BP 128/64 (BP Location: Left Arm, Patient Position: Sitting, Cuff Size: Large)   Pulse 76   Temp 98.7 F (37.1 C) (Oral)   Resp 16   Ht 5\' 2"  (1.575 m)   Wt 169 lb (76.7 kg)   SpO2 96% Comment: room air  BMI 30.91 kg/m   Physical Exam    General Appearance:    Alert, cooperative, no distress, appears stated age  Head:    Normocephalic, without obvious abnormality, atraumatic  Eyes:    PERRL, conjunctiva/corneas clear, EOM's intact, fundi    benign, both eyes  Ears:    Normal TM's and external ear canals, both ears  Nose:   Nares normal, septum midline, mucosa normal, no drainage    or sinus tenderness  Throat:   Lips, mucosa, and tongue normal; teeth and gums normal  Neck:   Supple, symmetrical, trachea midline, no adenopathy;    thyroid:  no enlargement/tenderness/nodules; no carotid   bruit or  JVD  Back:     Symmetric, no curvature, ROM normal, no CVA tenderness  Lungs:     Clear to auscultation bilaterally, respirations unlabored  Chest Wall:    No tenderness or deformity   Heart:    Regular rate and rhythm, S1 and S2 normal, no murmur, rub   or gallop  Breast Exam:    normal appearance, no masses or tenderness  Abdomen:     Soft, non-tender, bowel sounds active all four quadrants,    no  masses, no organomegaly  Pelvic:    not indicated; post-menopausal, no abnormal Pap smears in past  Extremities:   Extremities normal, atraumatic, no cyanosis or edema  Pulses:   2+ and symmetric all extremities  Skin:   Skin color, texture, turgor normal, no rashes or lesions  Lymph nodes:   Cervical, supraclavicular, and axillary nodes normal  Neurologic:   CNII-XII intact, normal strength, sensation and reflexes    throughout    Activities of Daily Living In your present state of health, do you have any difficulty performing the following activities: 01/10/2019 01/20/2018  Hearing? N N  Vision? N N  Comment Wears eye glasses. -  Difficulty concentrating or making decisions? N N  Walking or climbing stairs? N N  Dressing or bathing? N N  Doing errands, shopping? N N  Preparing Food and eating ? N -  Using the Toilet? N -  In the past six months, have you accidently leaked urine? N -  Do you have problems with loss of bowel control? N -  Managing your Medications? N -  Managing your Finances? N -  Housekeeping or managing your Housekeeping? N -  Some recent data might be hidden    Fall Risk Assessment Fall Risk  01/10/2019 01/04/2018 05/27/2017 01/02/2017 05/20/2016  Falls in the past year? 0 No No No No     Depression Screen PHQ 2/9 Scores 01/10/2019 05/06/2018 01/04/2018 05/27/2017  PHQ - 2 Score 0 0 0 0  PHQ- 9 Score - - - 0    6CIT Screen 05/27/2017  What Year? 0 points  What month? 0 points  What time? 0 points  Count back from 20 0 points  Months in reverse 0 points   Repeat phrase 2 points  Total Score 2       Assessment & Plan:    Annual Physical Reviewed patient's Family Medical History Reviewed and updated list of patient's medical providers Assessment of cognitive impairment was done Assessed patient's functional ability Established a written schedule for health screening [place] Completed and Reviewed  Exercise Activities and Dietary recommendations Goals    . DIET - REDUCE SUGAR INTAKE     Recommend to cut back on sugar and sweets in daily diet and subsitute for healthier snack.     . Exercise 3x per week (30 min per time)     Recommend to exercise for 3 days a week for at least 30 minutes at a time.     . Reduce sugar intake     Recommend decreasing amount of sugar and carbohydrates in daily diet.        Immunization History  Administered Date(s) Administered  . Influenza, High Dose Seasonal PF 03/18/2016, 03/27/2017, 03/29/2018  . Influenza,inj,Quad PF,6+ Mos 05/17/2015  . Pneumococcal Conjugate-13 05/20/2016  . Pneumococcal Polysaccharide-23 04/12/2008, 09/12/2013  . Tdap 05/03/2007  . Zoster 06/12/2009    Health Maintenance  Topic Date Due  . FOOT EXAM  09/23/1952  . TETANUS/TDAP  05/02/2017  . INFLUENZA VACCINE  02/05/2019  . HEMOGLOBIN A1C  02/28/2019  . URINE MICROALBUMIN  03/30/2019  . OPHTHALMOLOGY EXAM  08/24/2019  . COLONOSCOPY  12/24/2019  . DEXA SCAN  02/04/2023  . PNA vac Low Risk Adult  Completed     Discussed health benefits of physical activity, and encouraged her to engage in regular exercise appropriate for her age and condition.    ------------------------------------------------------------------------------------------------------------    Lelon Huh, MD  [facility]

## 2019-01-10 NOTE — Assessment & Plan Note (Signed)
Well controlled. Continue current medications  

## 2019-01-12 DIAGNOSIS — E119 Type 2 diabetes mellitus without complications: Secondary | ICD-10-CM | POA: Diagnosis not present

## 2019-01-12 DIAGNOSIS — E039 Hypothyroidism, unspecified: Secondary | ICD-10-CM | POA: Diagnosis not present

## 2019-01-12 DIAGNOSIS — E782 Mixed hyperlipidemia: Secondary | ICD-10-CM | POA: Diagnosis not present

## 2019-01-12 DIAGNOSIS — I480 Paroxysmal atrial fibrillation: Secondary | ICD-10-CM | POA: Diagnosis not present

## 2019-01-13 LAB — COMPREHENSIVE METABOLIC PANEL
ALT: 18 IU/L (ref 0–32)
AST: 35 IU/L (ref 0–40)
Albumin/Globulin Ratio: 1.6 (ref 1.2–2.2)
Albumin: 3.9 g/dL (ref 3.7–4.7)
Alkaline Phosphatase: 95 IU/L (ref 39–117)
BUN/Creatinine Ratio: 18 (ref 12–28)
BUN: 20 mg/dL (ref 8–27)
Bilirubin Total: 0.4 mg/dL (ref 0.0–1.2)
CO2: 24 mmol/L (ref 20–29)
Calcium: 9.4 mg/dL (ref 8.7–10.3)
Chloride: 103 mmol/L (ref 96–106)
Creatinine, Ser: 1.1 mg/dL — ABNORMAL HIGH (ref 0.57–1.00)
GFR calc Af Amer: 56 mL/min/{1.73_m2} — ABNORMAL LOW (ref >59)
GFR calc non Af Amer: 49 mL/min/{1.73_m2} — ABNORMAL LOW (ref >59)
Globulin, Total: 2.5 g/dL (ref 1.5–4.5)
Glucose: 102 mg/dL — ABNORMAL HIGH (ref 65–99)
Potassium: 3.6 mmol/L (ref 3.5–5.2)
Sodium: 141 mmol/L (ref 134–144)
Total Protein: 6.4 g/dL (ref 6.0–8.5)

## 2019-01-13 LAB — CBC
Hematocrit: 38.3 % (ref 34.0–46.6)
Hemoglobin: 13 g/dL (ref 11.1–15.9)
MCH: 31.2 pg (ref 26.6–33.0)
MCHC: 33.9 g/dL (ref 31.5–35.7)
MCV: 92 fL (ref 79–97)
Platelets: 270 10*3/uL (ref 150–450)
RBC: 4.17 x10E6/uL (ref 3.77–5.28)
RDW: 11.9 % (ref 11.7–15.4)
WBC: 5.8 10*3/uL (ref 3.4–10.8)

## 2019-01-13 LAB — LIPID PANEL
Chol/HDL Ratio: 2.6 ratio (ref 0.0–4.4)
Cholesterol, Total: 158 mg/dL (ref 100–199)
HDL: 60 mg/dL (ref >39)
LDL Calculated: 76 mg/dL (ref 0–99)
Triglycerides: 111 mg/dL (ref 0–149)
VLDL Cholesterol Cal: 22 mg/dL (ref 5–40)

## 2019-01-13 LAB — HEMOGLOBIN A1C
Est. average glucose Bld gHb Est-mCnc: 134 mg/dL
Hgb A1c MFr Bld: 6.3 % — ABNORMAL HIGH (ref 4.8–5.6)

## 2019-01-13 LAB — TSH: TSH: 1.57 u[IU]/mL (ref 0.450–4.500)

## 2019-01-18 ENCOUNTER — Ambulatory Visit: Payer: Self-pay

## 2019-01-18 ENCOUNTER — Other Ambulatory Visit: Payer: Self-pay

## 2019-01-18 ENCOUNTER — Encounter: Payer: PPO | Admitting: Family Medicine

## 2019-01-18 DIAGNOSIS — E119 Type 2 diabetes mellitus without complications: Secondary | ICD-10-CM

## 2019-01-18 DIAGNOSIS — I10 Essential (primary) hypertension: Secondary | ICD-10-CM

## 2019-01-18 NOTE — Chronic Care Management (AMB) (Signed)
Chronic Care Management   Note  01/18/2019 Name: CERIYAH NEARING MRN: [id number] DOB: September 06, 1940  Care Coordination: Successful telephone encounter to ms. Derwood Kaplan. 6, 78 year old female patient of Dr. Mila Merry, who was referred to the CCM Team by her health plan. She consented to services and was scheduled for her initial assessment today at 3:00  Ms. Rivadeneira wished to have more detail about chronic case management services provided by her primary care practice. Ms. Burford states she is currently engaged with a Nurse Case Manager with [facility]. Her RN CM calls monthly and provides her with educational materials needed to better her health. Ms. Kinzler has been offered in home visits by [facility] but declines at this time as "my husband and I are doing very well".   Ms. Katsaros does not feel she needs an additional case manager at this time even if it is through her primary care provider. She willingly takes RN CM contact information and agrees to call CCM RN CM if needs or concerns that [facility] is unable to meet arises.   Ceil Roderick E. Suzie Portela, RN, BSN Nurse Care Coordinator [facility] Practice/[facility] Care Management [phone]

## 2019-02-10 ENCOUNTER — Other Ambulatory Visit: Payer: Self-pay | Admitting: Pharmacist

## 2019-02-10 NOTE — Patient Outreach (Signed)
[facility]) Care Management  [place]   02/10/2019  Destiny Gilbert 1940/09/28 [id number]  Reason for referral: Medication Assistance with Eliquis  Referral source: HTA Concierge Current insurance: [employer]  PMHx includes but not limited to:  HTN, T2DM, HLD, atrial fibrillation on chronic anticoagulation, hypothyroidism, hx CVA  Outreach:  Successful telephone call with Destiny Gilbert.  HIPAA identifiers verified.   Subjective:  Patient reports she usually picks up a 90 day supply of Eliquis for $90 but this has increased now that she is in the coverage gap. She also reports that her Advair co-pay has increased.  She has been only taking it at night once daily for years and her doctor is aware of this.  She states she picks up a 90 day supply but it actually lasts here 180 days as it is written for BID instructions.  She also has the albuterol inhaler for emergencies but states she has not had to use this yet.    Objective: The ASCVD Risk score Mikey Bussing DC Jr., et al., 2013) failed to calculate for the following reasons:   The patient has a prior MI or stroke diagnosis  Lab Results  Component Value Date   CREATININE 1.10 (H) 01/12/2019   CREATININE 0.89 07/19/2018   CREATININE 0.91 01/21/2018    Lab Results  Component Value Date   HGBA1C 6.3 (H) 01/12/2019    Lipid Panel     Component Value Date/Time   CHOL 158 01/12/2019 0833   TRIG 111 01/12/2019 0833   HDL 60 01/12/2019 0833   CHOLHDL 2.6 01/12/2019 0833   CHOLHDL 3.3 01/06/2017 0448   VLDL 44 (H) 01/06/2017 0448   LDLCALC 76 01/12/2019 0833    BP Readings from Last 3 Encounters:  01/10/19 128/64  12/07/18 120/70  08/30/18 134/66    Allergies  Allergen Reactions  . Dipyridamole Nausea Only    Headache  . Sulfa Antibiotics Nausea And Vomiting    Medications Reviewed Today    Reviewed by Randal Buba, CMA (Certified Medical Assistant) on 01/10/19 at 33  Med List  Status: <None>  Medication Order Taking? Sig Documenting Provider Last Dose Status Informant  acetaminophen (TYLENOL) 500 MG tablet [id number] Yes Take 500 mg by mouth every 6 (six) hours as needed. [provider] Taking Active Self  ADVAIR DISKUS 250-50 MCG/DOSE AEPB [id number] Yes INHALE 1 DOSE BY MOUTH TWICE DAILY Fisher, Kirstie Peri, MD Taking Active   albuterol [facility] HFA) 108 [phone] Base) MCG/ACT inhaler [id number] Yes Inhale 2 puffs into the lungs every 6 (six) hours as needed. Birdie Sons, MD Taking Active   atorvastatin (LIPITOR) 40 MG tablet [id number] Yes TAKE 1 TABLET BY MOUTH ONCE DAILY AT  6  PM Fisher, Kirstie Peri, MD Taking Active   butalbital-acetaminophen-caffeine [place]) [phone] MG tablet [id number] Yes Take 1-2 tablets by mouth every 8 (eight) hours as needed for headache. Lavonia Drafts, MD Taking Active   ELIQUIS 5 MG TABS tablet [id number] Yes Take 5 mg by mouth every 12 (twelve) hours. [provider] Taking Active   fluticasone (FLONASE) 50 MCG/ACT nasal spray [id number] Yes Place 2 sprays into both nostrils daily.  Patient taking differently: Place 2 sprays into both nostrils daily. As needed   Birdie Sons, MD Taking Active   Glucosamine-Chondroit-Vit C-Mn (GLUCOSAMINE CHONDR 1500 COMPLX) CAPS [id number] Yes Take 3-4 capsules by mouth daily. Takes 3-4 capsules daily  [provider] Taking Active Self  glucose blood (ONE  TOUCH ULTRA TEST) test strip [id number] Yes USE ONE STRIP TO CHECK GLUCOSE ONCE DAILY Birdie Sons, MD Taking Active   hydrocortisone [facility]) 2.5 % rectal cream [id number] Yes Place 1 application rectally 2 (two) times daily. Birdie Sons, MD Taking Active   levothyroxine (SYNTHROID, LEVOTHROID) 50 MCG tablet [id number] Yes TAKE 1 TABLET BY MOUTH ONCE DAILY BEFORE  BREAKFAST Birdie Sons, MD Taking Active   nitroGLYCERIN (NITROSTAT) 0.3 MG SL tablet [id number] Yes Place 1 tablet (0.3 mg total) under the tongue  every 5 (five) minutes as needed for chest pain. Birdie Sons, MD Taking Active   potassium chloride (K-DUR) 10 MEQ tablet [id number] Yes Take 1 tablet (10 mEq total) by mouth 2 (two) times daily. Birdie Sons, MD Taking Active   triamterene-hydrochlorothiazide [facility]) 37.5-25 MG tablet [id number] Yes TAKE 1 TABLET BY MOUTH ONCE DAILY Birdie Sons, MD Taking Active           Assessment: Drugs sorted by system:  Hematologic: apixaban  Cardiovascular: atorvastatin, triamterene-HCTZ  Pulmonary/Allergy: albuterol, fluticasone NS, fluticasone-salmeterol  Endocrine: levothyroxine  Topical: hydrocortisone cream  Pain: acetaminophen, butalbital-acetaminophen-caffeine  Vitamins/Minerals/Supplements: glucosamine-chondroitin complex, potassium  Medication Review Findings:  . Monitor potassium levels closely with potassium supplement and triamterene-HCTZ . Monitor closely for signs and symptoms of bleeding with apixaban - no issues per patient at this point  Medication Assistance Findings:  Medication assistance needs identified: Eliquis, Advair  Extra Help:  Not eligible for Extra Help Low Income Subsidy based on reported income and assets  Patient Assistance Programs: Advair made by [place] requirement met: No o Out-of-pocket prescription expenditure met:   Unknown - Patient has not met application requirements to apply for this program at this time.  - Reviewed program requirements with patient.    Eliquis made by [facility] o Income requirement met: No o Out-of-pocket prescription expenditure met:   Unknown - Patient has not met application requirements to apply for this program at this time.  - Reviewed program requirements with patient.   - Patient could consider substitution to [facility] which has no out-of-pocket expenditure and patient meets income criteria however patient would like to discuss with provider first and also will ask about samples to last through  the end of the year.    Plan: . Will close [facility] pharmacy case as no further medication needs identified at this time.  Am happy to assist in the future as needed.     Ralene Bathe, PharmD, [place] [phone]

## 2019-03-22 DIAGNOSIS — Z8673 Personal history of transient ischemic attack (TIA), and cerebral infarction without residual deficits: Secondary | ICD-10-CM | POA: Diagnosis not present

## 2019-03-29 ENCOUNTER — Other Ambulatory Visit: Payer: Self-pay | Admitting: Family Medicine

## 2019-04-18 ENCOUNTER — Other Ambulatory Visit: Payer: Self-pay | Admitting: Family Medicine

## 2019-05-11 DIAGNOSIS — Z20828 Contact with and (suspected) exposure to other viral communicable diseases: Secondary | ICD-10-CM | POA: Diagnosis not present

## 2019-05-18 DIAGNOSIS — I63532 Cerebral infarction due to unspecified occlusion or stenosis of left posterior cerebral artery: Secondary | ICD-10-CM | POA: Diagnosis not present

## 2019-05-18 DIAGNOSIS — Z95818 Presence of other cardiac implants and grafts: Secondary | ICD-10-CM | POA: Diagnosis not present

## 2019-05-18 DIAGNOSIS — I480 Paroxysmal atrial fibrillation: Secondary | ICD-10-CM | POA: Diagnosis not present

## 2019-05-18 DIAGNOSIS — I10 Essential (primary) hypertension: Secondary | ICD-10-CM | POA: Diagnosis not present

## 2019-05-18 DIAGNOSIS — E7800 Pure hypercholesterolemia, unspecified: Secondary | ICD-10-CM | POA: Diagnosis not present

## 2019-05-25 ENCOUNTER — Other Ambulatory Visit: Payer: Self-pay | Admitting: Family Medicine

## 2019-05-25 DIAGNOSIS — I10 Essential (primary) hypertension: Secondary | ICD-10-CM

## 2019-06-06 ENCOUNTER — Other Ambulatory Visit: Payer: Self-pay | Admitting: Family Medicine

## 2019-06-06 NOTE — Telephone Encounter (Signed)
Requested medication (s) are due for refill today: yes  Requested medication (s) are on the active medication list: yes  Last refill: 03/21/2019  Future visit scheduled: no  Notes to clinic:  Review for refill   Requested Prescriptions  Pending Prescriptions Disp Refills   PROCTO-MED HC 2.5 % rectal cream [Pharmacy Med Name: Procto-Med HC 2.5 % External Cream] 28 g 0    Sig: APPLY  CREAM RECTALLY TO AFFECTED AREA TWICE DAILY     Off-Protocol Failed - 06/06/2019 10:09 AM      Failed - Medication not assigned to a protocol, review manually.      Passed - Valid encounter within last 12 months    Recent Outpatient Visits          4 months ago Annual physical exam   [facility] Birdie Sons, MD   6 months ago Urinary tract infection without hematuria, site unspecified   [facility] Birdie Sons, MD   9 months ago Essential (primary) hypertension   [facility] Birdie Sons, MD   10 months ago Cough   [facility] Birdie Sons, MD   10 months ago Cough   [facility] Birdie Sons, MD            Over the Counter:  OTC Passed - 06/06/2019 10:09 AM      Passed - Valid encounter within last 12 months    Recent Outpatient Visits          4 months ago Annual physical exam   [facility] Birdie Sons, MD   6 months ago Urinary tract infection without hematuria, site unspecified   [facility] Birdie Sons, MD   9 months ago Essential (primary) hypertension   [facility] Birdie Sons, MD   10 months ago Cough   [facility] Birdie Sons, MD   10 months ago Cough   [facility] Birdie Sons, MD

## 2019-06-28 ENCOUNTER — Other Ambulatory Visit: Payer: Self-pay | Admitting: Family Medicine

## 2019-07-14 ENCOUNTER — Other Ambulatory Visit: Payer: Self-pay | Admitting: Family Medicine

## 2019-07-14 DIAGNOSIS — Z1231 Encounter for screening mammogram for malignant neoplasm of breast: Secondary | ICD-10-CM

## 2019-07-28 ENCOUNTER — Other Ambulatory Visit: Payer: Self-pay | Admitting: Family Medicine

## 2019-08-18 ENCOUNTER — Ambulatory Visit
Admission: RE | Admit: 2019-08-18 | Discharge: 2019-08-18 | Disposition: A | Discharge status: Home / Self Care | Payer: PPO | Source: Ambulatory Visit | Attending: Family Medicine | Admitting: Family Medicine

## 2019-08-18 DIAGNOSIS — Z1231 Encounter for screening mammogram for malignant neoplasm of breast: Secondary | ICD-10-CM | POA: Diagnosis not present

## 2019-08-24 ENCOUNTER — Other Ambulatory Visit: Payer: Self-pay | Admitting: Family Medicine

## 2019-08-24 DIAGNOSIS — I10 Essential (primary) hypertension: Secondary | ICD-10-CM

## 2019-08-24 DIAGNOSIS — E119 Type 2 diabetes mellitus without complications: Secondary | ICD-10-CM | POA: Diagnosis not present

## 2019-08-24 LAB — HM DIABETES EYE EXAM

## 2019-08-26 ENCOUNTER — Other Ambulatory Visit: Payer: Self-pay | Admitting: Family Medicine

## 2019-09-01 ENCOUNTER — Ambulatory Visit (INDEPENDENT_AMBULATORY_CARE_PROVIDER_SITE_OTHER): Payer: PPO | Admitting: Physician Assistant

## 2019-09-01 ENCOUNTER — Other Ambulatory Visit: Payer: Self-pay

## 2019-09-01 ENCOUNTER — Ambulatory Visit: Payer: Self-pay | Admitting: *Deleted

## 2019-09-01 DIAGNOSIS — E119 Type 2 diabetes mellitus without complications: Secondary | ICD-10-CM | POA: Diagnosis not present

## 2019-09-01 DIAGNOSIS — I480 Paroxysmal atrial fibrillation: Secondary | ICD-10-CM

## 2019-09-01 DIAGNOSIS — E1159 Type 2 diabetes mellitus with other circulatory complications: Secondary | ICD-10-CM | POA: Diagnosis not present

## 2019-09-01 LAB — POCT GLYCOSYLATED HEMOGLOBIN (HGB A1C)
Est. average glucose Bld gHb Est-mCnc: 134
Hemoglobin A1C: 6.3 % — AB (ref 4.0–5.6)

## 2019-09-01 MED ORDER — METFORMIN HCL 500 MG PO TABS: 500.0000 mg | ORAL_TABLET | Freq: Every day | ORAL | 0 refills | Status: DC

## 2019-09-01 NOTE — Progress Notes (Signed)
Patient: Destiny Gilbert Female    DOB: 1940-11-20   79 y.o.   MRN: [id number] Visit Date: 09/02/2019  Today's Provider: Trinna Post, PA-C   Chief Complaint  Patient presents with  . Diabetes   Subjective:     HPI   Patient has diet controlled diabetes. She was previously on metformin XR 500 mg QD but was discontinued due to concerns for recall over certain lots of the medication. Since then her A1c has remained under 7. Most recently over the past two weeks she reports her fasting sugars have been elevated in the 160s - 180's. She denies other symptoms. She is trying to reduce her sugar intake and increase exercise. She reports increased stress due to family issues.    Diabetes Mellitus Type II, Follow-up:   Lab Results  Component Value Date   HGBA1C 6.3 (A) 09/01/2019   HGBA1C 6.3 (H) 01/12/2019   HGBA1C 6.5 (A) 08/30/2018    Last seen for diabetes 7 months ago.  Management since then includes metformin was stopped. She reports good compliance with treatment. She is not having side effects.  Current symptoms include none and have been stable. Home blood sugar records: fasting range: 165-205  Episodes of hypoglycemia? no   Current insulin regiment: Is not on insulin Most Recent Eye Exam: 08/2019 Weight trend: stable Prior visit with dietician: No Current exercise: none Current diet habits: well balanced  Pertinent Labs:    Component Value Date/Time   CHOL 158 01/12/2019 0833   TRIG 111 01/12/2019 0833   HDL 60 01/12/2019 0833   LDLCALC 76 01/12/2019 0833   CREATININE 1.10 (H) 01/12/2019 0833   CREATININE 1.18 09/13/2013 0421    Wt Readings from Last 3 Encounters:  01/10/19 169 lb (76.7 kg)  12/07/18 168 lb (76.2 kg)  08/30/18 168 lb (76.2 kg)    ------------------------------------------------------------------------    Allergies  Allergen Reactions  . Dipyridamole Nausea Only    Headache  . Sulfa Antibiotics Nausea And Vomiting      Current Outpatient Medications:  .  acetaminophen (TYLENOL) 500 MG tablet, Take 500 mg by mouth every 6 (six) hours as needed., Disp: , Rfl:  .  ADVAIR DISKUS 250-50 MCG/DOSE AEPB, INHALE 1 DOSE BY MOUTH TWICE DAILY (Patient taking differently: Inhale 1 puff into the lungs daily. ), Disp: 180 each, Rfl: 4 .  albuterol (PROAIR HFA) 108 (90 Base) MCG/ACT inhaler, Inhale 2 puffs into the lungs every 6 (six) hours as needed., Disp: 1 Inhaler, Rfl: 2 .  atorvastatin (LIPITOR) 40 MG tablet, TAKE 1 TABLET BY MOUTH ONCE DAILY AT 6 PM., Disp: 90 tablet, Rfl: 4 .  ELIQUIS 5 MG TABS tablet, Take 5 mg by mouth every 12 (twelve) hours., Disp: , Rfl: 11 .  EUTHYROX 50 MCG tablet, TAKE 1 TABLET BY MOUTH ONCE DAILY BEFORE BREAKFAST, Disp: 90 tablet, Rfl: 1 .  fluticasone (FLONASE) 50 MCG/ACT nasal spray, Place 2 sprays into both nostrils daily. (Patient taking differently: Place 2 sprays into both nostrils daily. As needed), Disp: 16 g, Rfl: 6 .  Glucosamine-Chondroit-Vit C-Mn (GLUCOSAMINE CHONDR 1500 COMPLX) CAPS, Take 3-4 capsules by mouth daily. Takes 3-4 capsules daily , Disp: , Rfl:  .  nitroGLYCERIN (NITROSTAT) 0.3 MG SL tablet, Place 1 tablet (0.3 mg total) under the tongue every 5 (five) minutes as needed for chest pain., Disp: 30 tablet, Rfl: 0 .  ONETOUCH ULTRA test strip, USE ONE STRIP TO CHECK GLUCOSE ONCE DAILY, Disp:  100 each, Rfl: 1 .  potassium chloride (KLOR-CON) 10 MEQ tablet, Take 1 tablet by mouth twice daily, Disp: 180 tablet, Rfl: 0 .  PROCTO-MED HC 2.5 % rectal cream, APPLY  CREAM RECTALLY TO AFFECTED AREA TWICE DAILY, Disp: 28 g, Rfl: 4 .  triamterene-hydrochlorothiazide (MAXZIDE-25) 37.5-25 MG tablet, Take 1 tablet by mouth once daily, Disp: 90 tablet, Rfl: 0 .  metFORMIN (GLUCOPHAGE) 500 MG tablet, Take 1 tablet (500 mg total) by mouth daily with breakfast., Disp: 90 tablet, Rfl: 0  Review of Systems  Social History   Tobacco Use  . Smoking status: Never Smoker  . Smokeless  tobacco: Never Used  Substance Use Topics  . Alcohol use: Yes    Alcohol/week: 4.0 standard drinks    Types: 4 Glasses of wine per week      Objective:   There were no vitals taken for this visit. There were no vitals filed for this visit.There is no height or weight on file to calculate BMI.   Physical Exam Constitutional:      Appearance: Normal appearance.  Cardiovascular:     Rate and Rhythm: Normal rate and regular rhythm.     Heart sounds: Normal heart sounds.  Pulmonary:     Effort: Pulmonary effort is normal.     Breath sounds: Normal breath sounds.  Neurological:     Mental Status: She is alert and oriented to person, place, and time. Mental status is at baseline.  Psychiatric:        Mood and Affect: Mood normal.        Behavior: Behavior normal.      Results for orders placed or performed in visit on 09/01/19  POCT glycosylated hemoglobin (Hb A1C)  Result Value Ref Range   Hemoglobin A1C 6.3 (A) 4.0 - 5.6 %   HbA1c POC (<> result, manual entry)     HbA1c, POC (prediabetic range)     HbA1c, POC (controlled diabetic range)     Est. average glucose Bld gHb Est-mCnc 134        Assessment & Plan    1. Type 2 diabetes mellitus without complication, unspecified whether long term insulin use ([place])  Discussed with patient that her A1c is very well controlled at this point. Discussed a few different scenarios including the fact that as diabetes is a progressive disease, it is known to worsen over time and we may be catching A1c at the beginning of this process. She may be having temporary fluctuations due to diet or stress. I think it would be reasonable to resume metformin IR or wait 3 months to recheck her A1c. She is uncertain at this point of what she would like to do so I will send in a prescription of the metformin IR to take if she chooses to. She has a follow up with her PCP on 09/21/2019 which I think will be a good touch point for her.   - POCT glycosylated  hemoglobin (Hb A1C) - metFORMIN (GLUCOPHAGE) 500 MG tablet; Take 1 tablet (500 mg total) by mouth daily with breakfast.  Dispense: 90 tablet; Refill: 0  2. Type 2 diabetes mellitus with vascular disease ([place])   3. Intermittent atrial fibrillation (HCC)  The entirety of the information documented in the History of Present Illness, Review of Systems and Physical Exam were personally obtained by me. Portions of this information were initially documented by [facility] and reviewed by me for thoroughness and accuracy.   F/u PRN  Trinna Post, PA-C  [facility]

## 2019-09-01 NOTE — Patient Instructions (Signed)
Diabetes Mellitus and Exercise Exercising regularly is important for your overall health, especially when you have diabetes (diabetes mellitus). Exercising is not only about losing weight. It has many other health benefits, such as increasing muscle strength and bone density and reducing body fat and stress. This leads to improved fitness, flexibility, and endurance, all of which result in better overall health. Exercise has additional benefits for people with diabetes, including:  Reducing appetite.  Helping to lower and control blood glucose.  Lowering blood pressure.  Helping to control amounts of fatty substances (lipids) in the blood, such as cholesterol and triglycerides.  Helping the body to respond better to insulin (improving insulin sensitivity).  Reducing how much insulin the body needs.  Decreasing the risk for heart disease by: ? Lowering cholesterol and triglyceride levels. ? Increasing the levels of good cholesterol. ? Lowering blood glucose levels. What is my activity plan? Your health care provider or certified diabetes educator can help you make a plan for the type and frequency of exercise (activity plan) that works for you. Make sure that you:  Do at least 150 minutes of moderate-intensity or vigorous-intensity exercise each week. This could be brisk walking, biking, or water aerobics. ? Do stretching and strength exercises, such as yoga or weightlifting, at least 2 times a week. ? Spread out your activity over at least 3 days of the week.  Get some form of physical activity every day. ? Do not go more than 2 days in a row without some kind of physical activity. ? Avoid being inactive for more than 30 minutes at a time. Take frequent breaks to walk or stretch.  Choose a type of exercise or activity that you enjoy, and set realistic goals.  Start slowly, and gradually increase the intensity of your exercise over time. What do I need to know about managing my  diabetes?   Check your blood glucose before and after exercising. ? If your blood glucose is 240 mg/dL (13.3 mmol/L) or higher before you exercise, check your urine for ketones. If you have ketones in your urine, do not exercise until your blood glucose returns to normal. ? If your blood glucose is 100 mg/dL (5.6 mmol/L) or lower, eat a snack containing 15-20 grams of carbohydrate. Check your blood glucose 15 minutes after the snack to make sure that your level is above 100 mg/dL (5.6 mmol/L) before you start your exercise.  Know the symptoms of low blood glucose (hypoglycemia) and how to treat it. Your risk for hypoglycemia increases during and after exercise. Common symptoms of hypoglycemia can include: ? Hunger. ? Anxiety. ? Sweating and feeling clammy. ? Confusion. ? Dizziness or feeling light-headed. ? Increased heart rate or palpitations. ? Blurry vision. ? Tingling or numbness around the mouth, lips, or tongue. ? Tremors or shakes. ? Irritability.  Keep a rapid-acting carbohydrate snack available before, during, and after exercise to help prevent or treat hypoglycemia.  Avoid injecting insulin into areas of the body that are going to be exercised. For example, avoid injecting insulin into: ? The arms, when playing tennis. ? The legs, when jogging.  Keep records of your exercise habits. Doing this can help you and your health care provider adjust your diabetes management plan as needed. Write down: ? Food that you eat before and after you exercise. ? Blood glucose levels before and after you exercise. ? The type and amount of exercise you have done. ? When your insulin is expected to peak, if you use   insulin. Avoid exercising at times when your insulin is peaking.  When you start a new exercise or activity, work with your health care provider to make sure the activity is safe for you, and to adjust your insulin, medicines, or food intake as needed.  Drink plenty of water while  you exercise to prevent dehydration or heat stroke. Drink enough fluid to keep your urine clear or pale yellow. Summary  Exercising regularly is important for your overall health, especially when you have diabetes (diabetes mellitus).  Exercising has many health benefits, such as increasing muscle strength and bone density and reducing body fat and stress.  Your health care provider or certified diabetes educator can help you make a plan for the type and frequency of exercise (activity plan) that works for you.  When you start a new exercise or activity, work with your health care provider to make sure the activity is safe for you, and to adjust your insulin, medicines, or food intake as needed. This information is not intended to replace advice given to you by your health care provider. Make sure you discuss any questions you have with your health care provider. Document Revised: 01/15/2017 Document Reviewed: 12/03/2015 Elsevier Patient Education  2020 [employer] Inc[employer]  

## 2019-09-01 NOTE — Telephone Encounter (Signed)
Patient calling with fasting cbg's in the 180's for the last 2 weeks. Today, fasting 205 with mild lightheadedness. She does not take medication for diabetes at this time. Stated she was taken off metformin about one year ago. Instructed patient to drink lots of water beginning now and eat only low carb/low sugar foods today. Any worsening or additional symptoms to seek treatment at the ED. No availabitiy with PCP today. Scheduled afternoon appointment with Narda Bonds, PA for 3:20. Cleared via DT for office visit. Routing to office.  Reason for Disposition . [1] Symptoms of high blood sugar (e.g., frequent urination, weak, weight loss) AND [2] not able to test blood glucose  Answer Assessment - Initial Assessment Questions 1. BLOOD GLUCOSE: "What is your blood glucose level?"      205 cbg @9 :27am. 2. ONSET: "When did you check the blood glucose?"    9:27a 3. USUAL RANGE: "What is your glucose level usually?" (e.g., usual fasting morning value, usual evening value)    140's until the last 2 weeks. 4. KETONES: "Do you check for ketones (urine or blood test strips)?" If yes, ask: "What does the test show now?"     No 5. TYPE 1 or 2:  "Do you know what type of diabetes you have?"  (e.g., Type 1, Type 2, Gestational; doesn't know)     type 6. INSULIN: "Do you take insulin?" "What type of insulin(s) do you use? What is the mode of delivery? (syringe, pen; injection or pump)?"      no 7. DIABETES PILLS: "Do you take any pills for your diabetes?" If yes, ask: "Have you missed taking any pills recently?"     yes 8. OTHER SYMPTOMS: "Do you have any symptoms?" (e.g., fever, frequent urination, difficulty breathing, dizziness, weakness, vomiting)     Somewhat lightheaded this morning.Is improving. 9. PREGNANCY: "Is there any chance you are pregnant?" "When was your last menstrual period?"     na  Protocols used: DIABETES - HIGH BLOOD SUGAR-A-AH

## 2019-09-20 NOTE — Progress Notes (Signed)
Patient: Destiny Gilbert   DOB: [birth year]   79 y.o. Female  MRN: [id number] Visit Date: 09/21/2019  Today's Provider: Lelon Huh, MD  Subjective:    Chief Complaint  Patient presents with  . Diabetes   HPI  Diabetes Mellitus Type II, Follow-up:   Lab Results  Component Value Date   HGBA1C 6.3 (A) 09/01/2019   HGBA1C 6.3 (H) 01/12/2019   HGBA1C 6.5 (A) 08/30/2018    Last seen for diabetes 2 weeks ago (seen by Carles Collet, PA-C).  Management since then includes no changes. Patient was uncertain whether she wanted to take medications. She was advised to follow up in 1 month. Prescription for IR Metformin was sent to her pharmacy. She reports fair compliance with treatment. Patient decided to not take Metformin and work on lifestyle changes. She is not having side effects.  Current symptoms include none  Home blood sugar records: fasting range: averaging 150's  Episodes of hypoglycemia? yes - 12 episodes since 07/14/2019   Current insulin regiment: Is not on insulin Most Recent Eye Exam: 08/24/2019 Weight trend: stable Prior visit with dietician: No Current exercise: cardiovascular workout on exercise equipment Current diet habits: low carb diet   Pertinent Labs:    Component Value Date/Time   CHOL 158 01/12/2019 0833   TRIG 111 01/12/2019 0833   HDL 60 01/12/2019 0833   LDLCALC 76 01/12/2019 0833   CREATININE 1.10 (H) 01/12/2019 0833   CREATININE 1.18 09/13/2013 0421    Wt Readings from Last 3 Encounters:  01/10/19 169 lb (76.7 kg)  12/07/18 168 lb (76.2 kg)  08/30/18 168 lb (76.2 kg)    ------------------------------------------------------------------------      Medications: Outpatient Medications Prior to Visit  Medication Sig Dispense Refill  . acetaminophen (TYLENOL) 500 MG tablet Take 500 mg by mouth every 6 (six) hours as needed.    Marland Kitchen ADVAIR DISKUS 250-50 MCG/DOSE AEPB INHALE 1 DOSE BY MOUTH TWICE DAILY (Patient taking differently:  Inhale 1 puff into the lungs daily. ) 180 each 4  . albuterol (PROAIR HFA) 108 (90 Base) MCG/ACT inhaler Inhale 2 puffs into the lungs every 6 (six) hours as needed. 1 Inhaler 2  . atorvastatin (LIPITOR) 40 MG tablet TAKE 1 TABLET BY MOUTH ONCE DAILY AT 6 PM. 90 tablet 4  . ELIQUIS 5 MG TABS tablet Take 5 mg by mouth every 12 (twelve) hours.  11  . EUTHYROX 50 MCG tablet TAKE 1 TABLET BY MOUTH ONCE DAILY BEFORE BREAKFAST 90 tablet 1  . fluticasone (FLONASE) 50 MCG/ACT nasal spray Place 2 sprays into both nostrils daily. (Patient taking differently: Place 2 sprays into both nostrils daily. As needed) 16 g 6  . Glucosamine-Chondroit-Vit C-Mn (GLUCOSAMINE CHONDR 1500 COMPLX) CAPS Take 3-4 capsules by mouth daily. Takes 3-4 capsules daily     . nitroGLYCERIN (NITROSTAT) 0.3 MG SL tablet Place 1 tablet (0.3 mg total) under the tongue every 5 (five) minutes as needed for chest pain. 30 tablet 0  . ONETOUCH ULTRA test strip USE ONE STRIP TO CHECK GLUCOSE ONCE DAILY 100 each 1  . potassium chloride (KLOR-CON) 10 MEQ tablet Take 1 tablet by mouth twice daily 180 tablet 0  . PROCTO-MED HC 2.5 % rectal cream APPLY  CREAM RECTALLY TO AFFECTED AREA TWICE DAILY 28 g 4  . triamterene-hydrochlorothiazide (MAXZIDE-25) 37.5-25 MG tablet Take 1 tablet by mouth once daily 90 tablet 0  . metFORMIN (GLUCOPHAGE) 500 MG tablet Take 1 tablet (500 mg  total) by mouth daily with breakfast. (Patient not taking: Reported on 09/21/2019) 90 tablet 0   No facility-administered medications prior to visit.      Review of Systems    Objective:    BP 124/72 (BP Location: Right Arm, Patient Position: Sitting, Cuff Size: Normal)   Pulse 69   Temp (!) 96.9 F (36.1 C) (Temporal)   Wt 166 lb 12.8 oz (75.7 kg)   BMI 30.51 kg/m    Physical Exam    General: Appearance:    Obese female in no acute distress  Eyes:    PERRL, conjunctiva/corneas clear, EOM's intact       Lungs:     Clear to auscultation bilaterally,  respirations unlabored  Heart:    Normal heart rate. Regular rhythm. No murmurs, rubs, or gallops.   MS:   All extremities are intact.   Neurologic:   Awake, alert, oriented x 3. No apparent focal neurological           defect.        Results for orders placed or performed in visit on 09/21/19  POCT UA - Microalbumin  Result Value Ref Range   Microalbumin Ur, POC negative mg/L      Assessment & Plan:    1. Type 2 diabetes mellitus without complication, unspecified whether long term insulin use ([place]) Doing well off of metformin, counseled to work on improving diet and increasing   2. Stress She does report she has been feeling stressed lately and has started OTC St John's wort which she feels has been helping.  - St Johns Wort 300 MG CAPS; Take by mouth.     Lelon Huh, MD  [facility] [phone] (phone) [phone] (fax)  [place]

## 2019-09-21 ENCOUNTER — Encounter: Payer: Self-pay | Admitting: Family Medicine

## 2019-09-21 ENCOUNTER — Ambulatory Visit (INDEPENDENT_AMBULATORY_CARE_PROVIDER_SITE_OTHER): Payer: PPO | Admitting: Family Medicine

## 2019-09-21 ENCOUNTER — Other Ambulatory Visit: Payer: Self-pay

## 2019-09-21 VITALS — BP 124/72 | HR 69 | Temp 96.9°F | Wt 166.8 lb

## 2019-09-21 DIAGNOSIS — E119 Type 2 diabetes mellitus without complications: Secondary | ICD-10-CM

## 2019-09-21 DIAGNOSIS — F4329 Adjustment disorder with other symptoms: Secondary | ICD-10-CM | POA: Diagnosis not present

## 2019-09-21 LAB — POCT UA - MICROALBUMIN: Microalbumin Ur, POC: NEGATIVE mg/L

## 2019-09-30 DIAGNOSIS — I63532 Cerebral infarction due to unspecified occlusion or stenosis of left posterior cerebral artery: Secondary | ICD-10-CM | POA: Diagnosis not present

## 2019-11-15 DIAGNOSIS — I63532 Cerebral infarction due to unspecified occlusion or stenosis of left posterior cerebral artery: Secondary | ICD-10-CM | POA: Diagnosis not present

## 2019-11-15 DIAGNOSIS — Z95818 Presence of other cardiac implants and grafts: Secondary | ICD-10-CM | POA: Diagnosis not present

## 2019-11-15 DIAGNOSIS — E7800 Pure hypercholesterolemia, unspecified: Secondary | ICD-10-CM | POA: Diagnosis not present

## 2019-11-15 DIAGNOSIS — I480 Paroxysmal atrial fibrillation: Secondary | ICD-10-CM | POA: Diagnosis not present

## 2019-11-22 ENCOUNTER — Other Ambulatory Visit: Payer: Self-pay | Admitting: Family Medicine

## 2019-11-22 DIAGNOSIS — I10 Essential (primary) hypertension: Secondary | ICD-10-CM

## 2019-12-27 ENCOUNTER — Telehealth: Payer: Self-pay

## 2019-12-27 DIAGNOSIS — E039 Hypothyroidism, unspecified: Secondary | ICD-10-CM

## 2019-12-27 DIAGNOSIS — E782 Mixed hyperlipidemia: Secondary | ICD-10-CM

## 2019-12-27 DIAGNOSIS — I10 Essential (primary) hypertension: Secondary | ICD-10-CM

## 2019-12-27 DIAGNOSIS — E1159 Type 2 diabetes mellitus with other circulatory complications: Secondary | ICD-10-CM

## 2019-12-27 NOTE — Telephone Encounter (Signed)
Have placed future order. She can stop by anytime before appt. She needs to be fasting.

## 2019-12-27 NOTE — Telephone Encounter (Signed)
Copied from [place] [id number]. Topic: Quick Communication - See Telephone Encounter >> Dec 27, 2019 10:49 AM Loma Boston wrote: CRM for notification. See Telephone encounter for: 12/27/19. PT wanting FU CB as sch for Annual Nurse then Dr appt on 7/20, would like to have order placed for lab work so she could discuss labs with dr. Daryll Brod with pt was requested when orders were placed at [phone]

## 2019-12-27 NOTE — Telephone Encounter (Signed)
Patient advised.

## 2020-01-12 DIAGNOSIS — I63532 Cerebral infarction due to unspecified occlusion or stenosis of left posterior cerebral artery: Secondary | ICD-10-CM | POA: Diagnosis not present

## 2020-01-23 ENCOUNTER — Other Ambulatory Visit: Payer: Self-pay

## 2020-01-23 DIAGNOSIS — E782 Mixed hyperlipidemia: Secondary | ICD-10-CM

## 2020-01-23 DIAGNOSIS — E039 Hypothyroidism, unspecified: Secondary | ICD-10-CM

## 2020-01-23 DIAGNOSIS — E1159 Type 2 diabetes mellitus with other circulatory complications: Secondary | ICD-10-CM

## 2020-01-23 NOTE — Progress Notes (Signed)
Subjective:   Destiny Gilbert is a 79 y.o. female who presents for Medicare Annual (Subsequent) preventive examination.  Review of Systems    N/A  Cardiac Risk Factors include: advanced age (>52men, >47 women)     Objective:    Today's Vitals   01/24/20 1325 01/24/20 1326  BP:  136/60  Pulse:  93  Temp:  (!) 97.1 F (36.2 C)  TempSrc:  Temporal  SpO2:  95%  Weight:  162 lb 6.4 oz (73.7 kg)  Height:  5\' 2"  (1.575 m)  PainSc: 8  8   PainLoc:  Back   Body mass index is 29.7 kg/m.  Advanced Directives 01/24/2020 01/10/2019 07/19/2018 05/06/2018 01/20/2018 01/04/2018 07/29/2017  Does Patient Have a Medical Advance Directive? No No No Yes No No No  Type of Advance Directive - - Public librarian;Living will - - -  Copy of [place] in Chart? - - - No - copy requested - - -  Would patient like information on creating a medical advance directive? No - Patient declined No - Patient declined - No - Patient declined No - Patient declined No - Patient declined No - Patient declined    Current Medications (verified) Outpatient Encounter Medications as of 01/24/2020  Medication Sig  . acetaminophen (TYLENOL) 500 MG tablet Take 500 mg by mouth every 6 (six) hours as needed.  Marland Kitchen ADVAIR DISKUS 250-50 MCG/DOSE AEPB INHALE 1 DOSE BY MOUTH TWICE DAILY (Patient taking differently: Inhale 1 puff into the lungs daily. )  . albuterol (PROAIR HFA) 108 (90 Base) MCG/ACT inhaler Inhale 2 puffs into the lungs every 6 (six) hours as needed.  Marland Kitchen atorvastatin (LIPITOR) 40 MG tablet TAKE 1 TABLET BY MOUTH ONCE DAILY AT 6 PM.  . ELIQUIS 5 MG TABS tablet Take 5 mg by mouth every 12 (twelve) hours.  . EUTHYROX 50 MCG tablet TAKE 1 TABLET BY MOUTH ONCE DAILY BEFORE BREAKFAST  . Glucosamine-Chondroit-Vit C-Mn (GLUCOSAMINE CHONDR 1500 COMPLX) CAPS Take 3-4 capsules by mouth daily. Takes 3-4 capsules daily   . nitroGLYCERIN (NITROSTAT) 0.3 MG SL tablet Place 1 tablet (0.3 mg total) under  the tongue every 5 (five) minutes as needed for chest pain.  Glory Rosebush ULTRA test strip USE ONE STRIP TO CHECK GLUCOSE ONCE DAILY  . potassium chloride (KLOR-CON) 10 MEQ tablet Take 1 tablet by mouth twice daily  . PROCTO-MED HC 2.5 % rectal cream APPLY  CREAM RECTALLY TO AFFECTED AREA TWICE DAILY  . St Johns Wort 300 MG CAPS Take 300 mg by mouth in the morning, at noon, and at bedtime.   . triamterene-hydrochlorothiazide (MAXZIDE-25) 37.5-25 MG tablet Take 1 tablet by mouth once daily  . fluticasone (FLONASE) 50 MCG/ACT nasal spray Place 2 sprays into both nostrils daily. (Patient not taking: Reported on 01/24/2020)   No facility-administered encounter medications on file as of 01/24/2020.    Allergies (verified) Dipyridamole and Sulfa antibiotics   History: Past Medical History:  Diagnosis Date  . Diabetes mellitus without complication ([place])   . History of peptic ulcer    perforated  . Hyperlipidemia   . Hypertension   . Pneumonia 01/23/2015   [facility]    Past Surgical History:  Procedure Laterality Date  . ABDOMINAL HYSTERECTOMY  1994   BSO  . CATARACT EXTRACTION     left eye- 09/2014; righr eye- 08/13/2014  . COLONOSCOPY WITH PROPOFOL N/A 12/23/2016   Procedure: COLONOSCOPY WITH PROPOFOL;  Surgeon: Lollie Sails, MD;  Location: [facility] ENDOSCOPY;  Service: Endoscopy;  Laterality: N/A;  . INSERTION OF CARDIAC MONITOR    . INSERTION OF CARDIAC MONITOR    . LOOP RECORDER INSERTION N/A 07/29/2017   Procedure: LOOP RECORDER INSERTION;  Surgeon: Isaias Cowman, MD;  Location: [facility];  Service: Cardiovascular;  Laterality: N/A;   Family History  Problem Relation Age of Onset  . Lung cancer Mother   . Pancreatic cancer Father   . Hypertension Sister   . Hyperlipidemia Sister   . Breast cancer Neg Hx    Social History   Socioeconomic History  . Marital status: Married    Spouse name: Not on file  . Number of children: 3  . Years of education: Not on file    . Highest education level: Some college, no degree  Occupational History  . Occupation: Retired  Tobacco Use  . Smoking status: Never Smoker  . Smokeless tobacco: Never Used  Vaping Use  . Vaping Use: Never used  Substance and Sexual Activity  . Alcohol use: Yes    Alcohol/week: 2.0 - 4.0 standard drinks    Types: 2 - 4 Glasses of wine per week    Comment: twice a week  . Drug use: No  . Sexual activity: Not on file  Other Topics Concern  . Not on file  Social History Narrative  . Not on file   Social Determinants of Health   Financial Resource Strain: Low Risk   . Difficulty of Paying Living Expenses: Not very hard  Food Insecurity: No Food Insecurity  . Worried About Charity fundraiser in the Last Year: Never true  . Ran Out of Food in the Last Year: Never true  Transportation Needs: No Transportation Needs  . Lack of Transportation (Medical): No  . Lack of Transportation (Non-Medical): No  Physical Activity: Inactive  . Days of Exercise per Week: 0 days  . Minutes of Exercise per Session: 0 min  Stress: No Stress Concern Present  . Feeling of Stress : Not at all  Social Connections: Moderately Integrated  . Frequency of Communication with Friends and Family: More than three times a week  . Frequency of Social Gatherings with Friends and Family: More than three times a week  . Attends Religious Services: More than 4 times per year  . Active Member of Clubs or Organizations: No  . Attends Archivist Meetings: Never  . Marital Status: Married    Tobacco Counseling Counseling given: Not Answered   Clinical Intake:  Pre-visit preparation completed: Yes  Pain : 0-10 Pain Score: 8  Pain Type: Acute pain Pain Location: Back (Sciatic nerve pain) Pain Orientation: Lower Pain Descriptors / Indicators: Aching, Radiating Pain Onset: 1 to 4 weeks ago Pain Relieving Factors: Takes Tylenol as needed for pain.  Pain Relieving Factors: Takes Tylenol as  needed for pain.  Nutritional Status: BMI 25 -29 Overweight Nutritional Risks: None Diabetes: Yes  How often do you need to have someone help you when you read instructions, pamphlets, or other written materials from your doctor or pharmacy?: 1 - Never  Diabetic? Yes  Nutrition Risk Assessment:  Has the patient had any N/V/D within the last 2 months?  No  Does the patient have any non-healing wounds?  No  Has the patient had any unintentional weight loss or weight gain?  No   Diabetes:  Is the patient diabetic?  Yes  If diabetic, was a CBG obtained today?  No  Did  the patient bring in their glucometer from home?  No  How often do you monitor your CBG's? Twice a week.   Financial Strains and Diabetes Management:  Are you having any financial strains with the device, your supplies or your medication? No .  Does the patient want to be seen by Chronic Care Management for management of their diabetes?  No  Would the patient like to be referred to a Nutritionist or for Diabetic Management?  No   Diabetic Exams:  Diabetic Eye Exam: Completed 08/24/19. Diabetic Foot Exam: Overdue, Pt has been advised about the importance in completing this exam. Note made to follow up on this at today's in apt.    Interpreter Needed?: No  Information entered by :: [facility], LPN   Activities of Daily Living In your present state of health, do you have any difficulty performing the following activities: 01/24/2020  Hearing? N  Vision? N  Comment Wears eye glasses.  Difficulty concentrating or making decisions? N  Walking or climbing stairs? N  Dressing or bathing? N  Doing errands, shopping? N  Preparing Food and eating ? N  Using the Toilet? N  In the past six months, have you accidently leaked urine? N  Do you have problems with loss of bowel control? N  Managing your Medications? N  Managing your Finances? N  Housekeeping or managing your Housekeeping? N  Some recent data might be  hidden    Patient Care Team: Birdie Sons, MD as PCP - General (Family Medicine) Isaias Cowman, MD as Consulting Physician (Cardiology) Anabel Bene, MD as Referring Physician (Neurology) Clyde Canterbury, MD as Referring Physician (Otolaryngology) Leandrew Koyanagi, MD as Referring Physician (Ophthalmology) Efrain Sella, MD as Consulting Physician (Gastroenterology)  Indicate any recent Medical Services you may have received from other than [facility] providers in the past year (date may be approximate).     Assessment:   This is a routine wellness examination for Eisa.  Hearing/Vision screen No exam data present  Dietary issues and exercise activities discussed: Current Exercise Habits: The patient does not participate in regular exercise at present, Exercise limited by: neurologic condition(s) (Sciatic nerve pain.)  Goals    . DIET - REDUCE SUGAR INTAKE     Recommend to cut back on sugar and sweets in daily diet and subsitute for healthier snack.     . Exercise 3x per week (30 min per time)     Recommend to exercise for 3 days a week for at least 30 minutes at a time.     Marland Kitchen LIFESTYLE - DECREASE FALLS RISK     Recommend to remove any items from the home that may cause slips or trips.      Depression Screen PHQ 2/9 Scores 01/24/2020 01/10/2019 05/06/2018 01/04/2018 05/27/2017 01/02/2017 01/02/2017  PHQ - 2 Score 0 0 0 0 0 0 0  PHQ- 9 Score - - - - 0 0 -    Fall Risk Fall Risk  01/24/2020 09/21/2019 01/10/2019 01/04/2018 05/27/2017  Falls in the past year? 1 0 0 No No  Comment tripped - - - -  Number falls in past yr: 0 - - - -  Injury with Fall? 0 - - - -  Follow up Falls prevention discussed - - - -    Any stairs in or around the home? No  If so, are there any without handrails? N/A Home free of loose throw rugs in walkways, pet beds, electrical cords, etc? Yes  Adequate lighting in your home to reduce risk of falls? Yes   ASSISTIVE DEVICES UTILIZED TO  PREVENT FALLS:  Life alert? No  Use of a cane, walker or w/c? No  Grab bars in the bathroom? No  Shower chair or bench in shower? No  Elevated toilet seat or a handicapped toilet? Yes   TIMED UP AND GO:  Was the test performed? Yes .  Length of time to ambulate 10 feet: 9 sec.   Gait steady and fast without use of assistive device  Cognitive Function:     6CIT Screen 01/24/2020 05/27/2017 01/02/2017  What Year? 0 points 0 points 0 points  What month? 0 points 0 points 0 points  What time? 0 points 0 points 0 points  Count back from 20 0 points 0 points 0 points  Months in reverse 0 points 0 points 0 points  Repeat phrase 2 points 2 points 0 points  Total Score 2 2 0    Immunizations Immunization History  Administered Date(s) Administered  . Influenza, High Dose Seasonal PF 03/18/2016, 03/27/2017, 03/29/2018, 04/05/2019  . Influenza,inj,Quad PF,6+ Mos 05/17/2015  . PFIZER SARS-COV-2 Vaccination 07/14/2019, 08/04/2019  . Pneumococcal Conjugate-13 05/20/2016  . Pneumococcal Polysaccharide-23 04/12/2008, 09/12/2013  . Tdap 05/03/2007  . Zoster 06/12/2009  . Zoster Recombinat (Shingrix) 05/18/2019    TDAP status: Due, Education has been provided regarding the importance of this vaccine. Advised may receive this vaccine at local pharmacy or Health Dept. Aware to provide a copy of the vaccination record if obtained from local pharmacy or Health Dept. Verbalized acceptance and understanding. Flu Vaccine status: Up to date Pneumococcal vaccine status: Up to date Covid-19 vaccine status: Completed vaccines  Qualifies for Shingles Vaccine? Yes   Zostavax completed Yes   Shingrix Completed?: Completed series.  Screening Tests Health Maintenance  Topic Date Due  . COLONOSCOPY  12/24/2019  . FOOT EXAM  01/10/2020  . TETANUS/TDAP  01/23/2021 (Originally 05/02/2017)  . INFLUENZA VACCINE  02/05/2020  . HEMOGLOBIN A1C  07/25/2020  . OPHTHALMOLOGY EXAM  08/23/2020  . URINE  MICROALBUMIN  09/20/2020  . DEXA SCAN  02/04/2023  . COVID-19 Vaccine  Completed  . PNA vac Low Risk Adult  Completed    Health Maintenance  Health Maintenance Due  Topic Date Due  . COLONOSCOPY  12/24/2019  . FOOT EXAM  01/10/2020    Colorectal cancer screening: Currently due. Consult scheduled 02/2020. Mammogram status: Completed 08/18/19. Repeat every year Bone Density status: Completed 02/03/18. Previous DEXA scan was normal. No repeat needed unless advised by a physician.  Lung Cancer Screening: (Low Dose CT Chest recommended if Age 41-80 years, 30 pack-year currently smoking OR have quit w/in 15years.) does not qualify.   Additional Screening:  Vision Screening: Recommended annual ophthalmology exams for early detection of glaucoma and other disorders of the eye. Is the patient up to date with their annual eye exam?  Yes  Who is the provider or what is the name of the office in which the patient attends annual eye exams? Dr Wallace Going @ [facility] If pt is not established with a provider, would they like to be referred to a provider to establish care? No .   Dental Screening: Recommended annual dental exams for proper oral hygiene  Community Resource Referral / Chronic Care Management: CRR required this visit?  No   CCM required this visit?  No      Plan:     I have personally reviewed and noted the following in  the patient's chart:   . Medical and social history . Use of alcohol, tobacco or illicit drugs  . Current medications and supplements . Functional ability and status . Nutritional status . Physical activity . Advanced directives . List of other physicians . Hospitalizations, surgeries, and ER visits in previous 12 months . Vitals . Screenings to include cognitive, depression, and falls . Referrals and appointments  In addition, I have reviewed and discussed with patient certain preventive protocols, quality metrics, and best practice recommendations. A  written personalized care plan for preventive services as well as general preventive health recommendations were provided to patient.     Tenise Stetler [place], [place]   [id number]   Nurse Notes: Pt needs a diabetic foot exam today. Colonoscopy consult is scheduled for 02/2020.

## 2020-01-24 ENCOUNTER — Ambulatory Visit (INDEPENDENT_AMBULATORY_CARE_PROVIDER_SITE_OTHER): Payer: PPO

## 2020-01-24 ENCOUNTER — Ambulatory Visit (INDEPENDENT_AMBULATORY_CARE_PROVIDER_SITE_OTHER): Payer: PPO | Admitting: Family Medicine

## 2020-01-24 ENCOUNTER — Other Ambulatory Visit: Payer: Self-pay

## 2020-01-24 ENCOUNTER — Encounter: Payer: Self-pay | Admitting: Family Medicine

## 2020-01-24 VITALS — BP 136/60 | HR 93 | Temp 97.1°F | Resp 16 | Ht 62.0 in | Wt 162.0 lb

## 2020-01-24 VITALS — BP 136/60 | HR 93 | Temp 97.1°F | Ht 62.0 in | Wt 162.4 lb

## 2020-01-24 DIAGNOSIS — E1159 Type 2 diabetes mellitus with other circulatory complications: Secondary | ICD-10-CM | POA: Diagnosis not present

## 2020-01-24 DIAGNOSIS — M549 Dorsalgia, unspecified: Secondary | ICD-10-CM | POA: Diagnosis not present

## 2020-01-24 DIAGNOSIS — Z0000 Encounter for general adult medical examination without abnormal findings: Secondary | ICD-10-CM | POA: Diagnosis not present

## 2020-01-24 DIAGNOSIS — I10 Essential (primary) hypertension: Secondary | ICD-10-CM | POA: Diagnosis not present

## 2020-01-24 DIAGNOSIS — E039 Hypothyroidism, unspecified: Secondary | ICD-10-CM

## 2020-01-24 DIAGNOSIS — E782 Mixed hyperlipidemia: Secondary | ICD-10-CM

## 2020-01-24 LAB — COMPREHENSIVE METABOLIC PANEL
ALT: 23 IU/L (ref 0–32)
AST: 37 IU/L (ref 0–40)
Albumin/Globulin Ratio: 1.5 (ref 1.2–2.2)
Albumin: 4 g/dL (ref 3.7–4.7)
Alkaline Phosphatase: 91 IU/L (ref 48–121)
BUN/Creatinine Ratio: 21 (ref 12–28)
BUN: 19 mg/dL (ref 8–27)
Bilirubin Total: 0.5 mg/dL (ref 0.0–1.2)
CO2: 26 mmol/L (ref 20–29)
Calcium: 9.5 mg/dL (ref 8.7–10.3)
Chloride: 103 mmol/L (ref 96–106)
Creatinine, Ser: 0.9 mg/dL (ref 0.57–1.00)
GFR calc Af Amer: 71 mL/min/{1.73_m2} (ref >59)
GFR calc non Af Amer: 62 mL/min/{1.73_m2} (ref >59)
Globulin, Total: 2.7 g/dL (ref 1.5–4.5)
Glucose: 102 mg/dL — ABNORMAL HIGH (ref 65–99)
Potassium: 3.6 mmol/L (ref 3.5–5.2)
Sodium: 142 mmol/L (ref 134–144)
Total Protein: 6.7 g/dL (ref 6.0–8.5)

## 2020-01-24 LAB — CBC
Hematocrit: 39.3 % (ref 34.0–46.6)
Hemoglobin: 13 g/dL (ref 11.1–15.9)
MCH: 32.1 pg (ref 26.6–33.0)
MCHC: 33.1 g/dL (ref 31.5–35.7)
MCV: 97 fL (ref 79–97)
Platelets: 276 10*3/uL (ref 150–450)
RBC: 4.05 x10E6/uL (ref 3.77–5.28)
RDW: 11.8 % (ref 11.7–15.4)
WBC: 6.3 10*3/uL (ref 3.4–10.8)

## 2020-01-24 LAB — HEMOGLOBIN A1C
Est. average glucose Bld gHb Est-mCnc: 134 mg/dL
Hgb A1c MFr Bld: 6.3 % — ABNORMAL HIGH (ref 4.8–5.6)

## 2020-01-24 LAB — TSH: TSH: 2.42 u[IU]/mL (ref 0.450–4.500)

## 2020-01-24 LAB — LIPID PANEL
Chol/HDL Ratio: 2.8 ratio (ref 0.0–4.4)
Cholesterol, Total: 165 mg/dL (ref 100–199)
HDL: 58 mg/dL (ref >39)
LDL Chol Calc (NIH): 87 mg/dL (ref 0–99)
Triglycerides: 113 mg/dL (ref 0–149)
VLDL Cholesterol Cal: 20 mg/dL (ref 5–40)

## 2020-01-24 LAB — T4, FREE: Free T4: 1.06 ng/dL (ref 0.82–1.77)

## 2020-01-24 MED ORDER — LEVOTHYROXINE SODIUM 50 MCG PO TABS: ORAL_TABLET | ORAL | 4 refills | Status: DC

## 2020-01-24 MED ORDER — TRIAMTERENE-HCTZ 37.5-25 MG PO TABS: 1.0000 | ORAL_TABLET | Freq: Every day | ORAL | 4 refills | Status: DC

## 2020-01-24 MED ORDER — POTASSIUM CHLORIDE ER 10 MEQ PO TBCR: 10.0000 meq | EXTENDED_RELEASE_TABLET | Freq: Two times a day (BID) | ORAL | 4 refills | Status: DC

## 2020-01-24 MED ORDER — CYCLOBENZAPRINE HCL 5 MG PO TABS: 5.0000 mg | ORAL_TABLET | Freq: Three times a day (TID) | ORAL | 1 refills | Status: DC | PRN

## 2020-01-24 NOTE — Patient Instructions (Addendum)
Destiny Gilbert , Thank you for taking time to come for your Medicare Wellness Visit. I appreciate your ongoing commitment to your health goals. Please review the following plan we discussed and let me know if I can assist you in the future.   Screening recommendations/referrals: Colonoscopy: Currently due, consult scheduled 02/2020. Mammogram: Up to date, due 08/2020 Bone Density: Previous DEXA scan was normal. No repeat needed unless advised by a physician. Recommended yearly ophthalmology/optometry visit for glaucoma screening and checkup Recommended yearly dental visit for hygiene and checkup  Vaccinations: Influenza vaccine: Due 04/04/20 Pneumococcal vaccine: Completed series Tdap vaccine: Currently due, declined today.  Shingles vaccine: Completed series.    Advanced directives: Advance directive discussed with you today. Even though you declined this today please call our office should you change your mind and we can give you the proper paperwork for you to fill out.  Conditions/risks identified: Fall risk prevention discussed today.   Next appointment: 2:00 PM with Dr Rosanna Randy. Declined scheduling an AWV for 2022 at this time.    Preventive Care 69 Years and Older, Female Preventive care refers to lifestyle choices and visits with your health care provider that can promote health and wellness. What does preventive care include?  A yearly physical exam. This is also called an annual well check.  Dental exams once or twice a year.  Routine eye exams. Ask your health care provider how often you should have your eyes checked.  Personal lifestyle choices, including:  Daily care of your teeth and gums.  Regular physical activity.  Eating a healthy diet.  Avoiding tobacco and drug use.  Limiting alcohol use.  Practicing safe sex.  Taking low-dose aspirin every day.  Taking vitamin and mineral supplements as recommended by your health care provider. What happens during an  annual well check? The services and screenings done by your health care provider during your annual well check will depend on your age, overall health, lifestyle risk factors, and family history of disease. Counseling  Your health care provider may ask you questions about your:  Alcohol use.  Tobacco use.  Drug use.  Emotional well-being.  Home and relationship well-being.  Sexual activity.  Eating habits.  History of falls.  Memory and ability to understand (cognition).  Work and work Statistician.  Reproductive health. Screening  You may have the following tests or measurements:  Height, weight, and BMI.  Blood pressure.  Lipid and cholesterol levels. These may be checked every 5 years, or more frequently if you are over 53 years old.  Skin check.  Lung cancer screening. You may have this screening every year starting at age 51 if you have a 30-pack-year history of smoking and currently smoke or have quit within the past 15 years.  Fecal occult blood test (FOBT) of the stool. You may have this test every year starting at age 34.  Flexible sigmoidoscopy or colonoscopy. You may have a sigmoidoscopy every 5 years or a colonoscopy every 10 years starting at age 67.  Hepatitis C blood test.  Hepatitis B blood test.  Sexually transmitted disease (STD) testing.  Diabetes screening. This is done by checking your blood sugar (glucose) after you have not eaten for a while (fasting). You may have this done every 1-3 years.  Bone density scan. This is done to screen for osteoporosis. You may have this done starting at age 29.  Mammogram. This may be done every 1-2 years. Talk to your health care provider about how often you  should have regular mammograms. Talk with your health care provider about your test results, treatment options, and if necessary, the need for more tests. Vaccines  Your health care provider may recommend certain vaccines, such as:  Influenza  vaccine. This is recommended every year.  Tetanus, diphtheria, and acellular pertussis (Tdap, Td) vaccine. You may need a Td booster every 10 years.  Zoster vaccine. You may need this after age 13.  Pneumococcal 13-valent conjugate (PCV13) vaccine. One dose is recommended after age 56.  Pneumococcal polysaccharide (PPSV23) vaccine. One dose is recommended after age 47. Talk to your health care provider about which screenings and vaccines you need and how often you need them. This information is not intended to replace advice given to you by your health care provider. Make sure you discuss any questions you have with your health care provider. Document Released: 07/20/2015 Document Revised: 03/12/2016 Document Reviewed: 04/24/2015 Elsevier Interactive Patient Education  2017 [place] Prevention in the Home Falls can cause injuries. They can happen to people of all ages. There are many things you can do to make your home safe and to help prevent falls. What can I do on the outside of my home?  Regularly fix the edges of walkways and driveways and fix any cracks.  Remove anything that might make you trip as you walk through a door, such as a raised step or threshold.  Trim any bushes or trees on the path to your home.  Use bright outdoor lighting.  Clear any walking paths of anything that might make someone trip, such as rocks or tools.  Regularly check to see if handrails are loose or broken. Make sure that both sides of any steps have handrails.  Any raised decks and porches should have guardrails on the edges.  Have any leaves, snow, or ice cleared regularly.  Use sand or salt on walking paths during winter.  Clean up any spills in your garage right away. This includes oil or grease spills. What can I do in the bathroom?  Use night lights.  Install grab bars by the toilet and in the tub and shower. Do not use towel bars as grab bars.  Use non-skid mats or decals  in the tub or shower.  If you need to sit down in the shower, use a plastic, non-slip stool.  Keep the floor dry. Clean up any water that spills on the floor as soon as it happens.  Remove soap buildup in the tub or shower regularly.  Attach bath mats securely with double-sided non-slip rug tape.  Do not have throw rugs and other things on the floor that can make you trip. What can I do in the bedroom?  Use night lights.  Make sure that you have a light by your bed that is easy to reach.  Do not use any sheets or blankets that are too big for your bed. They should not hang down onto the floor.  Have a firm chair that has side arms. You can use this for support while you get dressed.  Do not have throw rugs and other things on the floor that can make you trip. What can I do in the kitchen?  Clean up any spills right away.  Avoid walking on wet floors.  Keep items that you use a lot in easy-to-reach places.  If you need to reach something above you, use a strong step stool that has a grab bar.  Keep electrical cords out  of the way.  Do not use floor polish or wax that makes floors slippery. If you must use wax, use non-skid floor wax.  Do not have throw rugs and other things on the floor that can make you trip. What can I do with my stairs?  Do not leave any items on the stairs.  Make sure that there are handrails on both sides of the stairs and use them. Fix handrails that are broken or loose. Make sure that handrails are as long as the stairways.  Check any carpeting to make sure that it is firmly attached to the stairs. Fix any carpet that is loose or worn.  Avoid having throw rugs at the top or bottom of the stairs. If you do have throw rugs, attach them to the floor with carpet tape.  Make sure that you have a light switch at the top of the stairs and the bottom of the stairs. If you do not have them, ask someone to add them for you. What else can I do to help  prevent falls?  Wear shoes that:  Do not have high heels.  Have rubber bottoms.  Are comfortable and fit you well.  Are closed at the toe. Do not wear sandals.  If you use a stepladder:  Make sure that it is fully opened. Do not climb a closed stepladder.  Make sure that both sides of the stepladder are locked into place.  Ask someone to hold it for you, if possible.  Clearly mark and make sure that you can see:  Any grab bars or handrails.  First and last steps.  Where the edge of each step is.  Use tools that help you move around (mobility aids) if they are needed. These include:  Canes.  Walkers.  Scooters.  Crutches.  Turn on the lights when you go into a dark area. Replace any light bulbs as soon as they burn out.  Set up your furniture so you have a clear path. Avoid moving your furniture around.  If any of your floors are uneven, fix them.  If there are any pets around you, be aware of where they are.  Review your medicines with your doctor. Some medicines can make you feel dizzy. This can increase your chance of falling. Ask your doctor what other things that you can do to help prevent falls. This information is not intended to replace advice given to you by your health care provider. Make sure you discuss any questions you have with your health care provider. Document Released: 04/19/2009 Document Revised: 11/29/2015 Document Reviewed: 07/28/2014 Elsevier Interactive Patient Education  2017 [employer].

## 2020-01-24 NOTE — Progress Notes (Signed)
Complete physical exam   Patient: Destiny Gilbert   DOB: Jun 16, 1941   79 y.o. Female  MRN: [id number] Visit Date: 01/24/2020  Today's healthcare provider: Lelon Huh, MD   Chief Complaint  Patient presents with  . Annual Exam   Subjective    Destiny Gilbert is a 79 y.o. female who presents today for a complete physical exam.  She reports consuming a general diet. The patient does not participate in regular exercise at present. She generally feels well. She reports sleeping well. She does have additional problems to discuss today.   Has AWV with [facility] today at 1:20pm  HPI  Patient C/O lower back pain x 3 weeks. Patient reports pain radiates to legs. Patient denies any falls or injuries. Patient reports taking Tylenol PRN, reports moderate pain control. Patient reports pain is worse with activity.  States she rarely takes albuterol inhaler. Advair doesn't seem to help.    Diabetes Mellitus Type II, Follow-up  Lab Results  Component Value Date   HGBA1C 6.3 (H) 01/23/2020   HGBA1C 6.3 (A) 09/01/2019   HGBA1C 6.3 (H) 01/12/2019   Wt Readings from Last 3 Encounters:  01/24/20 162 lb (73.5 kg)  01/24/20 162 lb 6.4 oz (73.7 kg)  09/21/19 166 lb 12.8 oz (75.7 kg)   Last seen for diabetes 4 months ago.  Management since then includes counseling to work on improving diet and increasing exercise . She reports excellent compliance with treatment. She is not having side effects.   Home blood sugar records: fasting range: 140's  Episodes of hypoglycemia? No    Current insulin regiment: none Most Recent Eye Exam: 08/24/2019 Current exercise: gardening Current diet habits: in general, a "healthy" diet    Pertinent Labs: Lab Results  Component Value Date   CHOL 165 01/23/2020   HDL 58 01/23/2020   LDLCALC 87 01/23/2020   TRIG 113 01/23/2020   CHOLHDL 2.8 01/23/2020   Lab Results  Component Value Date   NA 142 01/23/2020   K 3.6 01/23/2020   CREATININE 0.90  01/23/2020   GFRNONAA 62 01/23/2020   GFRAA 71 01/23/2020   GLUCOSE 102 (H) 01/23/2020     ---------------------------------------------------------------------------------------------------  Hypothyroid, follow-up  Lab Results  Component Value Date   TSH 2.420 01/23/2020   TSH 1.570 01/12/2019   TSH 0.830 08/30/2018   FREET4 1.06 01/23/2020   Wt Readings from Last 3 Encounters:  01/24/20 162 lb (73.5 kg)  01/24/20 162 lb 6.4 oz (73.7 kg)  09/21/19 166 lb 12.8 oz (75.7 kg)    She was last seen for hypothyroid 1 year ago.  Management since that visit includes continue same medication. She reports excellent compliance with treatment. She is not having side effects.   Symptoms: No change in energy level No constipation  No diarrhea No heat / cold intolerance  No nervousness No palpitations  No weight changes    -----------------------------------------------------------------------------------------  Lipid/Cholesterol, Follow-up  Last lipid panel Other pertinent labs  Lab Results  Component Value Date   CHOL 165 01/23/2020   HDL 58 01/23/2020   LDLCALC 87 01/23/2020   TRIG 113 01/23/2020   CHOLHDL 2.8 01/23/2020   Lab Results  Component Value Date   ALT 23 01/23/2020   AST 37 01/23/2020   PLT 276 01/23/2020   TSH 2.420 01/23/2020     She was last seen for this 1 year ago.  Management since that visit includes continue same medication.  She reports excellent compliance with  treatment. She is not having side effects.   Symptoms: No chest pain No chest pressure/discomfort  No dyspnea No lower extremity edema  No numbness or tingling of extremity No orthopnea  No palpitations No paroxysmal nocturnal dyspnea  No speech difficulty No syncope   Current diet: in general, a "healthy" diet   Current exercise: gardening  The ASCVD Risk score Mikey Bussing DC Jr., et al., 2013) failed to calculate for the following reasons:   The patient has a prior MI or stroke  diagnosis  ---------------------------------------------------------------------------------------------------  Hypertension, follow-up  BP Readings from Last 3 Encounters:  01/24/20 136/60  01/24/20 136/60  09/21/19 124/72   Wt Readings from Last 3 Encounters:  01/24/20 162 lb (73.5 kg)  01/24/20 162 lb 6.4 oz (73.7 kg)  09/21/19 166 lb 12.8 oz (75.7 kg)     She was last seen for hypertension 1 year ago.  BP at that visit was 128/64. Management since that visit includes continue same medication.  She reports excellent compliance with treatment. She is not having side effects.  She is following a Regular diet. She is not exercising. She does not smoke.  Use of agents associated with hypertension: thyroid hormones.   Outside blood pressures are not being checked. Symptoms: No chest pain No chest pressure  No palpitations No syncope  No dyspnea No orthopnea  No paroxysmal nocturnal dyspnea No lower extremity edema   Pertinent labs: Lab Results  Component Value Date   CHOL 165 01/23/2020   HDL 58 01/23/2020   LDLCALC 87 01/23/2020   TRIG 113 01/23/2020   CHOLHDL 2.8 01/23/2020   Lab Results  Component Value Date   NA 142 01/23/2020   K 3.6 01/23/2020   CREATININE 0.90 01/23/2020   GFRNONAA 62 01/23/2020   GFRAA 71 01/23/2020   GLUCOSE 102 (H) 01/23/2020     The ASCVD Risk score (Goff DC Jr., et al., 2013) failed to calculate for the following reasons:   The patient has a prior MI or stroke diagnosis   ---------------------------------------------------------------------------------------------------  Past Medical History:  Diagnosis Date  . Diabetes mellitus without complication ([place])   . History of peptic ulcer    perforated  . Hyperlipidemia   . Hypertension   . Pneumonia 01/23/2015   [facility]    Past Surgical History:  Procedure Laterality Date  . ABDOMINAL HYSTERECTOMY  1994   BSO  . CATARACT EXTRACTION     left eye- 09/2014; righr eye- 08/13/2014    . COLONOSCOPY WITH PROPOFOL N/A 12/23/2016   Procedure: COLONOSCOPY WITH PROPOFOL;  Surgeon: Lollie Sails, MD;  Location: [facility] ENDOSCOPY;  Service: Endoscopy;  Laterality: N/A;  . INSERTION OF CARDIAC MONITOR    . INSERTION OF CARDIAC MONITOR    . LOOP RECORDER INSERTION N/A 07/29/2017   Procedure: LOOP RECORDER INSERTION;  Surgeon: Isaias Cowman, MD;  Location: [facility];  Service: Cardiovascular;  Laterality: N/A;   Social History   Socioeconomic History  . Marital status: Married    Spouse name: Not on file  . Number of children: 3  . Years of education: Not on file  . Highest education level: Some college, no degree  Occupational History  . Occupation: Retired  Tobacco Use  . Smoking status: Never Smoker  . Smokeless tobacco: Never Used  Vaping Use  . Vaping Use: Never used  Substance and Sexual Activity  . Alcohol use: Yes    Alcohol/week: 2.0 - 4.0 standard drinks    Types: 2 - 4  Glasses of wine per week    Comment: twice a week  . Drug use: No  . Sexual activity: Not on file  Other Topics Concern  . Not on file  Social History Narrative  . Not on file   Social Determinants of Health   Financial Resource Strain: Low Risk   . Difficulty of Paying Living Expenses: Not very hard  Food Insecurity: No Food Insecurity  . Worried About Charity fundraiser in the Last Year: Never true  . Ran Out of Food in the Last Year: Never true  Transportation Needs: No Transportation Needs  . Lack of Transportation (Medical): No  . Lack of Transportation (Non-Medical): No  Physical Activity: Inactive  . Days of Exercise per Week: 0 days  . Minutes of Exercise per Session: 0 min  Stress: No Stress Concern Present  . Feeling of Stress : Not at all  Social Connections: Moderately Integrated  . Frequency of Communication with Friends and Family: More than three times a week  . Frequency of Social Gatherings with Friends and Family: More than three times a week   . Attends Religious Services: More than 4 times per year  . Active Member of Clubs or Organizations: No  . Attends Archivist Meetings: Never  . Marital Status: Married  Human resources officer Violence: Not At Risk  . Fear of Current or Ex-Partner: No  . Emotionally Abused: No  . Physically Abused: No  . Sexually Abused: No   Family Status  Relation Name Status  . Mother  Deceased  . Father  Deceased  . Sister  Alive  . Neg Hx  (Not Specified)   Family History  Problem Relation Age of Onset  . Lung cancer Mother   . Pancreatic cancer Father   . Hypertension Sister   . Hyperlipidemia Sister   . Breast cancer Neg Hx    Allergies  Allergen Reactions  . Dipyridamole Nausea Only    Headache  . Sulfa Antibiotics Nausea And Vomiting    Patient Care Team: Birdie Sons, MD as PCP - General (Family Medicine) Isaias Cowman, MD as Consulting Physician (Cardiology) Anabel Bene, MD as Referring Physician (Neurology) Clyde Canterbury, MD as Referring Physician (Otolaryngology) Leandrew Koyanagi, MD as Referring Physician (Ophthalmology) Efrain Sella, MD as Consulting Physician (Gastroenterology)   Medications: Outpatient Medications Prior to Visit  Medication Sig  . acetaminophen (TYLENOL) 500 MG tablet Take 500 mg by mouth every 6 (six) hours as needed.  Marland Kitchen ADVAIR DISKUS 250-50 MCG/DOSE AEPB INHALE 1 DOSE BY MOUTH TWICE DAILY (Patient taking differently: Inhale 1 puff into the lungs daily. )  . albuterol (PROAIR HFA) 108 (90 Base) MCG/ACT inhaler Inhale 2 puffs into the lungs every 6 (six) hours as needed.  Marland Kitchen atorvastatin (LIPITOR) 40 MG tablet TAKE 1 TABLET BY MOUTH ONCE DAILY AT 6 PM.  . ELIQUIS 5 MG TABS tablet Take 5 mg by mouth every 12 (twelve) hours.  . EUTHYROX 50 MCG tablet TAKE 1 TABLET BY MOUTH ONCE DAILY BEFORE BREAKFAST  . Glucosamine-Chondroit-Vit C-Mn (GLUCOSAMINE CHONDR 1500 COMPLX) CAPS Take 3-4 capsules by mouth daily. Takes 3-4 capsules  daily   . nitroGLYCERIN (NITROSTAT) 0.3 MG SL tablet Place 1 tablet (0.3 mg total) under the tongue every 5 (five) minutes as needed for chest pain.  Glory Rosebush ULTRA test strip USE ONE STRIP TO CHECK GLUCOSE ONCE DAILY  . potassium chloride (KLOR-CON) 10 MEQ tablet Take 1 tablet by mouth twice daily  .  PROCTO-MED HC 2.5 % rectal cream APPLY  CREAM RECTALLY TO AFFECTED AREA TWICE DAILY  . St Johns Wort 300 MG CAPS Take 300 mg by mouth in the morning, at noon, and at bedtime.   . triamterene-hydrochlorothiazide (MAXZIDE-25) 37.5-25 MG tablet Take 1 tablet by mouth once daily  . fluticasone (FLONASE) 50 MCG/ACT nasal spray Place 2 sprays into both nostrils daily. (Patient not taking: Reported on 01/24/2020)   No facility-administered medications prior to visit.    Review of Systems  Constitutional: Negative for chills, fatigue and fever.  HENT: Negative for congestion, ear pain, rhinorrhea, sneezing and sore throat.   Eyes: Negative.  Negative for pain and redness.  Respiratory: Negative for cough, shortness of breath and wheezing.   Cardiovascular: Negative for chest pain and leg swelling.  Gastrointestinal: Negative for abdominal pain, blood in stool, constipation, diarrhea and nausea.  Endocrine: Negative for polydipsia and polyphagia.  Genitourinary: Negative.  Negative for dysuria, flank pain, hematuria, pelvic pain, vaginal bleeding and vaginal discharge.  Musculoskeletal: Negative for arthralgias, back pain, gait problem and joint swelling.  Skin: Negative for rash.  Neurological: Negative.  Negative for dizziness, tremors, seizures, weakness, light-headedness, numbness and headaches.  Hematological: Negative for adenopathy.  Psychiatric/Behavioral: Negative.  Negative for behavioral problems, confusion and dysphoric mood. The patient is not nervous/anxious and is not hyperactive.     Last CBC Lab Results  Component Value Date   WBC 6.3 01/23/2020   HGB 13.0 01/23/2020   HCT 39.3  01/23/2020   MCV 97 01/23/2020   MCH 32.1 01/23/2020   RDW 11.8 01/23/2020   PLT 276 66/44/0347   Last metabolic panel Lab Results  Component Value Date   GLUCOSE 102 (H) 01/23/2020   NA 142 01/23/2020   K 3.6 01/23/2020   CL 103 01/23/2020   CO2 26 01/23/2020   BUN 19 01/23/2020   CREATININE 0.90 01/23/2020   GFRNONAA 62 01/23/2020   GFRAA 71 01/23/2020   CALCIUM 9.5 01/23/2020   PHOS 3.6 03/18/2016   PROT 6.7 01/23/2020   ALBUMIN 4.0 01/23/2020   LABGLOB 2.7 01/23/2020   AGRATIO 1.5 01/23/2020   BILITOT 0.5 01/23/2020   ALKPHOS 91 01/23/2020   AST 37 01/23/2020   ALT 23 01/23/2020   ANIONGAP 10 07/19/2018   Last lipids Lab Results  Component Value Date   CHOL 165 01/23/2020   HDL 58 01/23/2020   LDLCALC 87 01/23/2020   TRIG 113 01/23/2020   CHOLHDL 2.8 01/23/2020   Last hemoglobin A1c Lab Results  Component Value Date   HGBA1C 6.3 (H) 01/23/2020   Last thyroid functions Lab Results  Component Value Date   TSH 2.420 01/23/2020      Objective    BP 136/60 (BP Location: Right Arm, Patient Position: Sitting, Cuff Size: Normal)   Pulse 93   Temp (!) 97.1 F (36.2 C) (Temporal)   Resp 16   Ht 5\' 2"  (1.575 m)   Wt 162 lb (73.5 kg)   BMI 29.63 kg/m  BP Readings from Last 3 Encounters:  01/24/20 136/60  01/24/20 136/60  09/21/19 124/72   Wt Readings from Last 3 Encounters:  01/24/20 162 lb (73.5 kg)  01/24/20 162 lb 6.4 oz (73.7 kg)  09/21/19 166 lb 12.8 oz (75.7 kg)      Physical Exam   General Appearance:     Overweight female. Alert, cooperative, in no acute distress, appears stated age   Head:    Normocephalic, without obvious abnormality, atraumatic  Eyes:  PERRL, conjunctiva/corneas clear, EOM's intact, fundi    benign, both eyes  Ears:    Normal TM's and external ear canals, both ears  Neck:   Supple, symmetrical, trachea midline, no adenopathy;    thyroid:  no enlargement/tenderness/nodules; no carotid   bruit or JVD  Back:      Symmetric, no curvature, ROM normal. Tender over para lumbar musculature, left more than right.   Lungs:     Clear to auscultation bilaterally, respirations unlabored  Chest Wall:    No tenderness or deformity   Heart:    Normal heart rate. Irregularly irregular rhythm. No murmurs, rubs, or gallops.   Breast Exam:    deferred  Abdomen:     Soft, non-tender, bowel sounds active all four quadrants,    no masses, no organomegaly  Pelvic:    deferred  Extremities:   All extremities are intact. No cyanosis or edema  Pulses:   2+ and symmetric all extremities  Skin:   Skin color, texture, turgor normal, no rashes or lesions  Lymph nodes:   Cervical, supraclavicular, and axillary nodes normal  Neurologic:   CNII-XII intact, normal strength, sensation and reflexes    throughout     Last depression screening scores PHQ 2/9 Scores 01/24/2020 01/10/2019 05/06/2018  PHQ - 2 Score 0 0 0  PHQ- 9 Score - - -   Last fall risk screening Fall Risk  01/24/2020  Falls in the past year? 1  Comment tripped  Number falls in past yr: 0  Injury with Fall? 0  Follow up Falls prevention discussed   Last Audit-C alcohol use screening Alcohol Use Disorder Test (AUDIT) 01/24/2020  1. How often do you have a drink containing alcohol? 3  2. How many drinks containing alcohol do you have on a typical day when you are drinking? 0  3. How often do you have six or more drinks on one occasion? 0  AUDIT-C Score 3  4. How often during the last year have you found that you were not able to stop drinking once you had started? 0  5. How often during the last year have you failed to do what was normally expected from you because of drinking? 0  6. How often during the last year have you needed a first drink in the morning to get yourself going after a heavy drinking session? 0  7. How often during the last year have you had a feeling of guilt of remorse after drinking? 0  8. How often during the last year have you been  unable to remember what happened the night before because you had been drinking? 0  9. Have you or someone else been injured as a result of your drinking? 0  10. Has a relative or friend or a doctor or another health worker been concerned about your drinking or suggested you cut down? 0  Alcohol Use Disorder Identification Test Final Score (AUDIT) 3  Alcohol Brief Interventions/Follow-up AUDIT Score <7 follow-up not indicated   A score of 3 or more in women, and 4 or more in men indicates increased risk for alcohol abuse, EXCEPT if all of the points are from question 1   No results found for any visits on 01/24/20.  Assessment & Plan    Routine Health Maintenance and Physical Exam  Exercise Activities and Dietary recommendations Goals    . DIET - REDUCE SUGAR INTAKE     Recommend to cut back on sugar and sweets in  daily diet and subsitute for healthier snack.     . Exercise 3x per week (30 min per time)     Recommend to exercise for 3 days a week for at least 30 minutes at a time.     Marland Kitchen LIFESTYLE - DECREASE FALLS RISK     Recommend to remove any items from the home that may cause slips or trips.       Immunization History  Administered Date(s) Administered  . Influenza, High Dose Seasonal PF 03/18/2016, 03/27/2017, 03/29/2018, 04/05/2019  . Influenza,inj,Quad PF,6+ Mos 05/17/2015  . PFIZER SARS-COV-2 Vaccination 07/14/2019, 08/04/2019  . Pneumococcal Conjugate-13 05/20/2016  . Pneumococcal Polysaccharide-23 04/12/2008, 09/12/2013  . Tdap 05/03/2007  . Zoster 06/12/2009  . Zoster Recombinat (Shingrix) 05/18/2019    Health Maintenance  Topic Date Due  . COLONOSCOPY  12/24/2019  . FOOT EXAM  01/10/2020  . TETANUS/TDAP  01/23/2021 (Originally 05/02/2017)  . INFLUENZA VACCINE  02/05/2020  . HEMOGLOBIN A1C  07/25/2020  . OPHTHALMOLOGY EXAM  08/23/2020  . URINE MICROALBUMIN  09/20/2020  . DEXA SCAN  02/04/2023  . COVID-19 Vaccine  Completed  . PNA vac Low Risk Adult   Completed    Discussed health benefits of physical activity, and encouraged her to engage in regular exercise appropriate for her age and condition.  1. Annual physical exam   2. Acute bilateral back pain, unspecified back location  - cyclobenzaprine (FLEXERIL) 5 MG tablet; Take 1 tablet (5 mg total) by mouth 3 (three) times daily as needed for muscle spasms.  Dispense: 30 tablet; Refill: 1  3. Essential (primary) hypertension Well controlled.  Continue current medications.   - potassium chloride (KLOR-CON) 10 MEQ tablet; Take 1 tablet (10 mEq total) by mouth 2 (two) times daily.  Dispense: 180 tablet; Refill: 4 - triamterene-hydrochlorothiazide (MAXZIDE-25) 37.5-25 MG tablet; Take 1 tablet by mouth daily.  Dispense: 90 tablet; Refill: 4  4. Type 2 diabetes mellitus with vascular disease ([place]) Well controlled.  Continue current medications.    5. Hypothyroidism, unspecified type Well controlled.  Continue current medications.   - levothyroxine (EUTHYROX) 50 MCG tablet; TAKE 1 TABLET BY MOUTH ONCE DAILY BEFORE BREAKFAST  Dispense: 90 tablet; Refill: 4  6. Mixed hyperlipidemia She is tolerating atorvastatin well with no adverse effects.     No follow-ups on file.     The entirety of the information documented in the History of Present Illness, Review of Systems and Physical Exam were personally obtained by me. Portions of this information were initially documented by the CMA and reviewed by me for thoroughness and accuracy.      Lelon Huh, MD  [facility] [phone] (phone) [phone] (fax)  [place]

## 2020-02-09 ENCOUNTER — Other Ambulatory Visit: Payer: Self-pay | Admitting: Family Medicine

## 2020-02-09 DIAGNOSIS — J0110 Acute frontal sinusitis, unspecified: Secondary | ICD-10-CM

## 2020-02-15 ENCOUNTER — Telehealth: Payer: Self-pay

## 2020-02-15 NOTE — Telephone Encounter (Signed)
Copied from [place] [id number]. Topic: General - Other >> Feb 15, 2020  9:18 AM Leward Quan A wrote: Reason for CRM: Patient is scheduled for a My Chart visit on 02/16/20 at 10.40 AM she need some instructions if someone could call and explain to her what to do please Ph# [phone]

## 2020-02-15 NOTE — Telephone Encounter (Signed)
Patient has an appointment with Adriana on 02/16/2020 at 10:40am.

## 2020-02-16 ENCOUNTER — Telehealth (INDEPENDENT_AMBULATORY_CARE_PROVIDER_SITE_OTHER): Payer: PPO | Admitting: Physician Assistant

## 2020-02-16 DIAGNOSIS — J309 Allergic rhinitis, unspecified: Secondary | ICD-10-CM

## 2020-02-16 DIAGNOSIS — J45909 Unspecified asthma, uncomplicated: Secondary | ICD-10-CM

## 2020-02-16 MED ORDER — LEVOCETIRIZINE DIHYDROCHLORIDE 5 MG PO TABS: 5.0000 mg | ORAL_TABLET | Freq: Every evening | ORAL | 1 refills | Status: DC

## 2020-02-16 NOTE — Progress Notes (Signed)
MyChart Video Visit    Virtual Visit via Video Note   This visit type was conducted due to national recommendations for restrictions regarding the COVID-19 Pandemic (e.g. social distancing) in an effort to limit this patient's exposure and mitigate transmission in our community. This patient is at least at moderate risk for complications without adequate follow up. This format is felt to be most appropriate for this patient at this time. Physical exam was limited by quality of the video and audio technology used for the visit.   Patient location: Home Provider location: Office   I discussed the limitations of evaluation and management by telemedicine and the availability of in person appointments. The patient expressed understanding and agreed to proceed.   Patient: Destiny Gilbert   DOB: 01-21-1941   79 y.o. Female  MRN: [id number] Visit Date: 02/16/2020  Today's healthcare provider: Trey Sailors, PA-C   Chief Complaint  Patient presents with  . Cough  I,Ernesto Zukowski M Coley Littles,acting as a scribe for Trey Sailors, PA-C.,have documented all relevant documentation on the behalf of Trey Sailors, PA-C,as directed by  Trey Sailors, PA-C while in the presence of Trey Sailors, PA-C.  Subjective    Cough This is a new problem. The current episode started 1 to 4 weeks ago. The problem has been gradually improving. The problem occurs constantly. The cough is productive of sputum. Associated symptoms include postnasal drip, rhinorrhea and a sore throat. Pertinent negatives include no chills, fever, headaches, shortness of breath or wheezing. The symptoms are aggravated by exercise. Her past medical history is significant for environmental allergies.    Patient reports that she is has been to ENT doctor and they advised her is she allergic to cats and dust. The cat does sleep on the sofa.  Patient reports that she does have a cat in the home and is not able to dust home daily so  she thinking it maybe allergies. She has had this previously and been prescribed a flonase nasal spray. She has had these symptoms prior to the pandemic. She does have a history of asthma and is not currently taking Advair. She has not been taking this for one month because she felt she was not wheezing at the time. Her cough was better one month ago. She is not currently taking 2nd generation antihistamine.      Medications: Outpatient Medications Prior to Visit  Medication Sig  . acetaminophen (TYLENOL) 500 MG tablet Take 500 mg by mouth every 6 (six) hours as needed.  Marland Kitchen atorvastatin (LIPITOR) 40 MG tablet TAKE 1 TABLET BY MOUTH ONCE DAILY AT 6 PM.  . cyclobenzaprine (FLEXERIL) 5 MG tablet Take 1 tablet (5 mg total) by mouth 3 (three) times daily as needed for muscle spasms.  Marland Kitchen ELIQUIS 5 MG TABS tablet Take 5 mg by mouth every 12 (twelve) hours.  . fluticasone (FLONASE) 50 MCG/ACT nasal spray Use 2 spray(s) in each nostril once daily  . Glucosamine-Chondroit-Vit C-Mn (GLUCOSAMINE CHONDR 1500 COMPLX) CAPS Take 3-4 capsules by mouth daily. Takes 3-4 capsules daily   . levothyroxine (EUTHYROX) 50 MCG tablet TAKE 1 TABLET BY MOUTH ONCE DAILY BEFORE BREAKFAST  . nitroGLYCERIN (NITROSTAT) 0.3 MG SL tablet Place 1 tablet (0.3 mg total) under the tongue every 5 (five) minutes as needed for chest pain.  Letta Pate ULTRA test strip USE ONE STRIP TO CHECK GLUCOSE ONCE DAILY  . potassium chloride (KLOR-CON) 10 MEQ tablet Take 1 tablet (10 mEq total)  by mouth 2 (two) times daily.  Marland Kitchen PROCTO-MED HC 2.5 % rectal cream APPLY  CREAM RECTALLY TO AFFECTED AREA TWICE DAILY  . St Johns Wort 300 MG CAPS Take 300 mg by mouth in the morning, at noon, and at bedtime.   . triamterene-hydrochlorothiazide (MAXZIDE-25) 37.5-25 MG tablet Take 1 tablet by mouth daily.  Marland Kitchen ADVAIR DISKUS 250-50 MCG/DOSE AEPB INHALE 1 DOSE BY MOUTH TWICE DAILY (Patient not taking: Reported on 02/16/2020)  . albuterol (PROAIR HFA) 108 (90 Base)  MCG/ACT inhaler Inhale 2 puffs into the lungs every 6 (six) hours as needed. (Patient not taking: Reported on 02/16/2020)   No facility-administered medications prior to visit.    Review of Systems  Constitutional: Negative for chills, fatigue and fever.  HENT: Positive for congestion, postnasal drip, rhinorrhea, sneezing, sore throat and voice change. Negative for sinus pressure and sinus pain.   Respiratory: Positive for cough. Negative for chest tightness, shortness of breath and wheezing.   Allergic/Immunologic: Positive for environmental allergies.  Neurological: Negative for weakness and headaches.      Objective    There were no vitals taken for this visit.   Physical Exam Constitutional:      Appearance: Normal appearance.  Pulmonary:     Effort: Pulmonary effort is normal. No respiratory distress.  Neurological:     Mental Status: She is alert.  Psychiatric:        Mood and Affect: Mood normal.        Behavior: Behavior normal.        Assessment & Plan    1. Allergic rhinitis, unspecified seasonality, unspecified trigger  Explained causes of chronic cough, I believe hers is in part due to allergies and worsening of asthma. Recommend she start daily 2nd gen antihistamine and resume her Advair daily.  - levocetirizine (XYZAL) 5 MG tablet; Take 1 tablet (5 mg total) by mouth every evening.  Dispense: 90 tablet; Refill: 1  2. Uncomplicated asthma, unspecified asthma severity, unspecified whether persistent  Acute worsening, resume adviar.   - levocetirizine (XYZAL) 5 MG tablet; Take 1 tablet (5 mg total) by mouth every evening.  Dispense: 90 tablet; Refill: 1    Return if symptoms worsen or fail to improve.     I discussed the assessment and treatment plan with the patient. The patient was provided an opportunity to ask questions and all were answered. The patient agreed with the plan and demonstrated an understanding of the instructions.   The patient was  advised to call back or seek an in-person evaluation if the symptoms worsen or if the condition fails to improve as anticipated.  The entirety of the information documented in the History of Present Illness, Review of Systems and Physical Exam were personally obtained by me. Portions of this information were initially documented by Anson [place], CMA and reviewed by me for thoroughness and accuracy.       Maryella Shivers [facility] [phone] (phone) [phone] (fax)  [facility]

## 2020-02-20 ENCOUNTER — Other Ambulatory Visit: Payer: Self-pay | Admitting: Family Medicine

## 2020-02-20 DIAGNOSIS — Z95818 Presence of other cardiac implants and grafts: Secondary | ICD-10-CM | POA: Diagnosis not present

## 2020-02-20 DIAGNOSIS — Z8673 Personal history of transient ischemic attack (TIA), and cerebral infarction without residual deficits: Secondary | ICD-10-CM | POA: Diagnosis not present

## 2020-02-20 DIAGNOSIS — I10 Essential (primary) hypertension: Secondary | ICD-10-CM

## 2020-02-20 DIAGNOSIS — K219 Gastro-esophageal reflux disease without esophagitis: Secondary | ICD-10-CM | POA: Diagnosis not present

## 2020-02-20 DIAGNOSIS — I480 Paroxysmal atrial fibrillation: Secondary | ICD-10-CM | POA: Diagnosis not present

## 2020-02-20 DIAGNOSIS — Z86010 Personal history of colonic polyps: Secondary | ICD-10-CM | POA: Diagnosis not present

## 2020-02-27 ENCOUNTER — Telehealth: Payer: Self-pay | Admitting: Family Medicine

## 2020-02-27 NOTE — Telephone Encounter (Signed)
Patient is calling to see if her 11:00am Virtual appt for a cough can be changed to an OV. Patient states she has had a cough for a month. Please advise [phone]

## 2020-02-27 NOTE — Telephone Encounter (Signed)
I advised patient that even though she has had cough x1 month.Our office policy at this time is those who have URI symptoms be seen as virtual appt. Patient states that she could not make virtual appt because of internet access. I let patient know that she cannot be seen in office. Tomorrows appt will be telephone visit. KW

## 2020-02-28 ENCOUNTER — Ambulatory Visit (INDEPENDENT_AMBULATORY_CARE_PROVIDER_SITE_OTHER): Payer: PPO | Admitting: Family Medicine

## 2020-02-28 ENCOUNTER — Other Ambulatory Visit: Payer: Self-pay

## 2020-02-28 DIAGNOSIS — J40 Bronchitis, not specified as acute or chronic: Secondary | ICD-10-CM

## 2020-02-28 MED ORDER — AZITHROMYCIN 250 MG PO TABS: ORAL_TABLET | ORAL | 0 refills | Status: AC

## 2020-02-28 NOTE — Progress Notes (Signed)
Virtual telephone visit    Virtual Visit via Telephone Note   This visit type was conducted due to national recommendations for restrictions regarding the COVID-19 Pandemic (e.g. social distancing) in an effort to limit this patient's exposure and mitigate transmission in our community. Due to her co-morbid illnesses, this patient is at least at moderate risk for complications without adequate follow up. This format is felt to be most appropriate for this patient at this time. The patient did not have access to video technology or had technical difficulties with video requiring transitioning to audio format only (telephone). Physical exam was limited to content and character of the telephone converstion.    Patient location: home Provider location: [facility]   Visit Date: 02/28/2020  Today's healthcare provider: Lelon Huh, MD   Chief Complaint  Patient presents with  . Cough   Subjective    Cough This is a chronic problem. The current episode started more than 1 month ago. The problem has been unchanged. The cough is productive of sputum. Associated symptoms include nasal congestion and wheezing. Pertinent negatives include no chest pain, chills, fever, headaches or shortness of breath.    Patient was seen by Carles Collet, PA-C virtually on 02/16/2020 for cough. She was advised to take Xyzal. Patient states that she hasn't seen any improvement in cough since trying this medication. She has been using Advair inhaler also. She has had similar sx in the past which responded to azithromycin. She requests order for chest Xray.       Medications: Outpatient Medications Prior to Visit  Medication Sig  . acetaminophen (TYLENOL) 500 MG tablet Take 500 mg by mouth every 6 (six) hours as needed.  Marland Kitchen ADVAIR DISKUS 250-50 MCG/DOSE AEPB INHALE 1 DOSE BY MOUTH TWICE DAILY  . atorvastatin (LIPITOR) 40 MG tablet TAKE 1 TABLET BY MOUTH ONCE DAILY AT 6 PM.  . ELIQUIS 5 MG TABS tablet Take 5 mg  by mouth every 12 (twelve) hours.  Marland Kitchen levocetirizine (XYZAL) 5 MG tablet Take 1 tablet (5 mg total) by mouth every evening.  Marland Kitchen levothyroxine (EUTHYROX) 50 MCG tablet TAKE 1 TABLET BY MOUTH ONCE DAILY BEFORE BREAKFAST  . nitroGLYCERIN (NITROSTAT) 0.3 MG SL tablet Place 1 tablet (0.3 mg total) under the tongue every 5 (five) minutes as needed for chest pain.  Glory Rosebush ULTRA test strip USE ONE STRIP TO CHECK GLUCOSE ONCE DAILY  . potassium chloride (KLOR-CON) 10 MEQ tablet Take 1 tablet (10 mEq total) by mouth 2 (two) times daily.  Marland Kitchen PROCTO-MED HC 2.5 % rectal cream APPLY  CREAM RECTALLY TO AFFECTED AREA TWICE DAILY  . St Johns Wort 300 MG CAPS Take 300 mg by mouth in the morning, at noon, and at bedtime.   . triamterene-hydrochlorothiazide (MAXZIDE-25) 37.5-25 MG tablet Take 1 tablet by mouth daily.  Marland Kitchen albuterol (PROAIR HFA) 108 (90 Base) MCG/ACT inhaler Inhale 2 puffs into the lungs every 6 (six) hours as needed. (Patient not taking: Reported on 02/28/2020)  . cyclobenzaprine (FLEXERIL) 5 MG tablet Take 1 tablet (5 mg total) by mouth 3 (three) times daily as needed for muscle spasms. (Patient not taking: Reported on 02/28/2020)  . fluticasone (FLONASE) 50 MCG/ACT nasal spray Use 2 spray(s) in each nostril once daily (Patient not taking: Reported on 02/28/2020)  . Glucosamine-Chondroit-Vit C-Mn (GLUCOSAMINE CHONDR 1500 COMPLX) CAPS Take 3-4 capsules by mouth daily. Takes 3-4 capsules daily    No facility-administered medications prior to visit.    Review of Systems  Constitutional:  Negative for appetite change, chills, fatigue and fever.  HENT: Positive for congestion (chest and throat congestion).   Respiratory: Positive for cough and wheezing. Negative for chest tightness and shortness of breath.   Cardiovascular: Negative for chest pain and palpitations.  Gastrointestinal: Negative for abdominal pain, nausea and vomiting.  Neurological: Negative for dizziness, weakness and headaches.       Objective    There were no vitals taken for this visit.   Awake, alert, oriented x 3. In no apparent distress. Coughing occasionally.   Assessment & Plan     1. Bronchitis (suspected) Failed prescription antihistamines and Advair.   rx- azithromycin (ZITHROMAX) 250 MG tablet; 2 by mouth today, then 1 daily for 4 days  Dispense: 6 tablet; Refill: 0  Recommend OTC Mucinex, continue Advair. Consider chest Xr if not starting to improve in 3-4 days. She will call back if so    I discussed the assessment and treatment plan with the patient. The patient was provided an opportunity to ask questions and all were answered. The patient agreed with the plan and demonstrated an understanding of the instructions.   The patient was advised to call back or seek an in-person evaluation if the symptoms worsen or if the condition fails to improve as anticipated.  I provided 12 minutes of non-face-to-face time during this encounter.  The entirety of the information documented in the History of Present Illness, Review of Systems and Physical Exam were personally obtained by me. Portions of this information were initially documented by the CMA and reviewed by me for thoroughness and accuracy.   I discussed the limitations of evaluation and management by telemedicine and the availability of in person appointments. The patient expressed understanding and agreed to proceed.     Lelon Huh, MD [facility] [phone] (phone) [phone] (fax)  [place]

## 2020-02-28 NOTE — Patient Instructions (Signed)
.   Please review the attached list of medications and notify my office if there are any errors.   . Please bring all of your medications to every appointment so we can make sure that our medication list is the same as yours.   

## 2020-03-13 ENCOUNTER — Telehealth: Payer: Self-pay | Admitting: *Deleted

## 2020-03-13 ENCOUNTER — Other Ambulatory Visit: Payer: Self-pay | Admitting: Family Medicine

## 2020-03-13 DIAGNOSIS — R05 Cough: Secondary | ICD-10-CM | POA: Diagnosis not present

## 2020-03-13 DIAGNOSIS — R059 Cough, unspecified: Secondary | ICD-10-CM

## 2020-03-13 NOTE — Telephone Encounter (Signed)
Advised patient as below. Patient will come today to have covid test done. Please place order. Thanks!

## 2020-03-13 NOTE — Telephone Encounter (Signed)
Order placed. i'm not sure if you have to give to [facility] or what, but it should be on the printer. Thanks.

## 2020-03-13 NOTE — Telephone Encounter (Signed)
Order req and label printed for [place].

## 2020-03-13 NOTE — Telephone Encounter (Signed)
Copied from [place] [id number]. Topic: General - Inquiry >> Mar 13, 2020  9:27 AM Mathis Bud wrote: Reason for CRM: Patient is requesting a in person appt.  Patient is still coughing and is wanting for someone to listen to her chest and take xrays.  Patient had virtual visit before. Patient wants in person due to not getting better, Call back [phone]

## 2020-03-13 NOTE — Telephone Encounter (Signed)
She needs a covid test first. If negative can schedule chest XR and in-person visit.

## 2020-03-13 NOTE — Telephone Encounter (Signed)
Please review. Thanks!  

## 2020-03-14 LAB — NOVEL CORONAVIRUS, NAA: SARS-CoV-2, NAA: NOT DETECTED

## 2020-03-15 ENCOUNTER — Telehealth: Payer: Self-pay | Admitting: Family Medicine

## 2020-03-15 ENCOUNTER — Other Ambulatory Visit: Payer: Self-pay | Admitting: Family Medicine

## 2020-03-15 ENCOUNTER — Ambulatory Visit
Admission: RE | Admit: 2020-03-15 | Discharge: 2020-03-15 | Disposition: A | Discharge status: Home / Self Care | Payer: PPO | Attending: Family Medicine | Admitting: Family Medicine

## 2020-03-15 ENCOUNTER — Other Ambulatory Visit: Payer: Self-pay

## 2020-03-15 ENCOUNTER — Ambulatory Visit
Admission: RE | Admit: 2020-03-15 | Discharge: 2020-03-15 | Disposition: A | Discharge status: Home / Self Care | Payer: PPO | Source: Ambulatory Visit | Attending: Family Medicine | Admitting: Family Medicine

## 2020-03-15 DIAGNOSIS — R059 Cough, unspecified: Secondary | ICD-10-CM

## 2020-03-15 DIAGNOSIS — R05 Cough: Secondary | ICD-10-CM | POA: Diagnosis not present

## 2020-03-15 NOTE — Telephone Encounter (Signed)
Please review over your schedule I checked today and tomorrows schedules for all providers and there is no openings. Please advise what time I can unblock to place appointment. Thanks. KW

## 2020-03-15 NOTE — Telephone Encounter (Signed)
Advised pt to come in at 10am tomorrow.

## 2020-03-15 NOTE — Progress Notes (Deleted)
     Established patient visit   Patient: Destiny Gilbert   DOB: 28-Feb-1941   79 y.o. Female  MRN: [id number] Visit Date: 03/16/2020  Today's healthcare provider: Trinna Post, PA-C   No chief complaint on file.  Subjective    Cough      {Show patient history (optional):23778::" "}   Medications: Outpatient Medications Prior to Visit  Medication Sig  . acetaminophen (TYLENOL) 500 MG tablet Take 500 mg by mouth every 6 (six) hours as needed.  Marland Kitchen ADVAIR DISKUS 250-50 MCG/DOSE AEPB INHALE 1 DOSE BY MOUTH TWICE DAILY  . albuterol (PROAIR HFA) 108 (90 Base) MCG/ACT inhaler Inhale 2 puffs into the lungs every 6 (six) hours as needed. (Patient not taking: Reported on 02/28/2020)  . atorvastatin (LIPITOR) 40 MG tablet TAKE 1 TABLET BY MOUTH ONCE DAILY AT 6 PM.  . cyclobenzaprine (FLEXERIL) 5 MG tablet Take 1 tablet (5 mg total) by mouth 3 (three) times daily as needed for muscle spasms. (Patient not taking: Reported on 02/28/2020)  . ELIQUIS 5 MG TABS tablet Take 5 mg by mouth every 12 (twelve) hours.  . fluticasone (FLONASE) 50 MCG/ACT nasal spray Use 2 spray(s) in each nostril once daily (Patient not taking: Reported on 02/28/2020)  . Glucosamine-Chondroit-Vit C-Mn (GLUCOSAMINE CHONDR 1500 COMPLX) CAPS Take 3-4 capsules by mouth daily. Takes 3-4 capsules daily   . levocetirizine (XYZAL) 5 MG tablet Take 1 tablet (5 mg total) by mouth every evening.  Marland Kitchen levothyroxine (EUTHYROX) 50 MCG tablet TAKE 1 TABLET BY MOUTH ONCE DAILY BEFORE BREAKFAST  . nitroGLYCERIN (NITROSTAT) 0.3 MG SL tablet Place 1 tablet (0.3 mg total) under the tongue every 5 (five) minutes as needed for chest pain.  Glory Rosebush ULTRA test strip USE ONE STRIP TO CHECK GLUCOSE ONCE DAILY  . potassium chloride (KLOR-CON) 10 MEQ tablet Take 1 tablet (10 mEq total) by mouth 2 (two) times daily.  Marland Kitchen PROCTO-MED HC 2.5 % rectal cream APPLY  CREAM RECTALLY TO AFFECTED AREA TWICE DAILY  . St Johns Wort 300 MG CAPS Take 300 mg by mouth  in the morning, at noon, and at bedtime.   . triamterene-hydrochlorothiazide (MAXZIDE-25) 37.5-25 MG tablet Take 1 tablet by mouth daily.   No facility-administered medications prior to visit.    Review of Systems  Respiratory: Positive for cough.     {Heme  Chem  Endocrine  Serology  Results Review (optional):23779::" "}  Objective    There were no vitals taken for this visit. {Show previous vital signs (optional):23777::" "}  Physical Exam  ***  No results found for any visits on 03/16/20.  Assessment & Plan     ***  No follow-ups on file.      {provider attestation***:1}   Paulene Floor  [facility] [phone] (phone) [phone] (fax)  [place]

## 2020-03-15 NOTE — Telephone Encounter (Signed)
She needs to be re-evaluated with an appointment. Since her COVID test was negative she can come in.

## 2020-03-15 NOTE — Telephone Encounter (Signed)
You can double book my 10:00 AM.

## 2020-03-15 NOTE — Telephone Encounter (Signed)
Please review.  Is it okay to order?  Thanks,   -Mamie Hundertmark  

## 2020-03-15 NOTE — Telephone Encounter (Signed)
Patient called because she continues to cough and have wheezing.  She is not getting better.  She is requesting a chest Xray.  Please call patient back.  Thanks, [employer]

## 2020-03-16 ENCOUNTER — Ambulatory Visit: Payer: PPO | Admitting: Physician Assistant

## 2020-03-16 ENCOUNTER — Telehealth: Payer: Self-pay

## 2020-03-16 DIAGNOSIS — R059 Cough, unspecified: Secondary | ICD-10-CM

## 2020-03-16 MED ORDER — CEFDINIR 300 MG PO CAPS: 300.0000 mg | ORAL_CAPSULE | Freq: Two times a day (BID) | ORAL | 0 refills | Status: DC

## 2020-03-16 MED ORDER — PREDNISONE 20 MG PO TABS: 20.0000 mg | ORAL_TABLET | Freq: Two times a day (BID) | ORAL | 0 refills | Status: DC

## 2020-03-16 NOTE — Telephone Encounter (Signed)
Patient advised and medication will be sent to patient's pharmacy. Patient also scheduled for 1 week f/u.

## 2020-03-16 NOTE — Telephone Encounter (Signed)
-----   Message from Birdie Sons, MD sent at 03/16/2020  8:02 AM EDT ----- CHEST Xray is clear, no sign of pneumonia or other chronic lung disease. Could be allergies or recurrent bronchitis.  Recommend she start cefdinir 300mg  twice a day for 7 days and prednisone 20mg  twice a day for 5 days. She can schedule in office visit to follow up and 1 week. She can have the same day slot next Friday.

## 2020-03-16 NOTE — Telephone Encounter (Signed)
-----   Message from Birdie Sons, MD sent at 03/15/2020  9:27 AM EDT ----- Please advise patient, Covid test is negative. She can go ahead and proceed with Chest Xr for persistent cough, have place order so she could go today. Thanks.

## 2020-03-16 NOTE — Telephone Encounter (Signed)
Patient advised of covid test results. She said she had received a call yesterday and went to have her chest xray done. She also said that she has an appointment this morning 9/10 with Destiny Gilbert.

## 2020-03-21 DIAGNOSIS — Z8673 Personal history of transient ischemic attack (TIA), and cerebral infarction without residual deficits: Secondary | ICD-10-CM | POA: Diagnosis not present

## 2020-03-23 ENCOUNTER — Ambulatory Visit (INDEPENDENT_AMBULATORY_CARE_PROVIDER_SITE_OTHER): Payer: PPO | Admitting: Family Medicine

## 2020-03-23 ENCOUNTER — Other Ambulatory Visit: Payer: Self-pay

## 2020-03-23 ENCOUNTER — Encounter: Payer: Self-pay | Admitting: Family Medicine

## 2020-03-23 VITALS — BP 127/62 | HR 68 | Temp 98.4°F | Resp 18 | Ht 62.0 in | Wt 160.0 lb

## 2020-03-23 DIAGNOSIS — J40 Bronchitis, not specified as acute or chronic: Secondary | ICD-10-CM

## 2020-03-23 DIAGNOSIS — J45909 Unspecified asthma, uncomplicated: Secondary | ICD-10-CM | POA: Diagnosis not present

## 2020-03-23 DIAGNOSIS — E119 Type 2 diabetes mellitus without complications: Secondary | ICD-10-CM

## 2020-03-23 MED ORDER — METFORMIN HCL ER 500 MG PO TB24: ORAL_TABLET | ORAL | 2 refills | Status: DC

## 2020-03-23 NOTE — Patient Instructions (Signed)
.   I recommend that you get a flu vaccine this year. Please call our office at [phone] at your earliest convenience to schedule a flu shot.    

## 2020-03-23 NOTE — Progress Notes (Signed)
I,Destiny Gilbert,acting as a scribe for Lelon Huh, MD.,have documented all relevant documentation on the behalf of Lelon Huh, MD,as directed by  Lelon Huh, MD while in the presence of Lelon Huh, MD.   Established patient visit   Patient: Destiny Gilbert   DOB: 04-Aug-1940   79 y.o. Female  MRN: [id number] Visit Date: 03/23/2020  Today's healthcare provider: Lelon Huh, MD   Chief Complaint  Patient presents with  . Cough  . Follow-up   Subjective    HPI  Bronchitis (suspected) From 02/28/2020-patient had video visit. Failed prescription antihistamines and Advair. Given rx for- azithromycin (ZITHROMAX) 250 mg.  Patient states she completed the prednisone yesterday and the antibiotic this morning. Patient wants provider to listen to her chest. Patient states she is not coughing anymore.  She also reports he blood sugars have been gradually rising the last several months, with some sugars over 200. She was previously on metformin ER which she tolerated well but stopped last year when it was recalled.   Lab Results  Component Value Date   HGBA1C 6.3 (H) 01/23/2020       Medications: Outpatient Medications Prior to Visit  Medication Sig  . acetaminophen (TYLENOL) 500 MG tablet Take 500 mg by mouth every 6 (six) hours as needed.  Marland Kitchen ADVAIR DISKUS 250-50 MCG/DOSE AEPB INHALE 1 DOSE BY MOUTH TWICE DAILY  . atorvastatin (LIPITOR) 40 MG tablet TAKE 1 TABLET BY MOUTH ONCE DAILY AT 6 PM.  . cefdinir (OMNICEF) 300 MG capsule Take 1 capsule (300 mg total) by mouth 2 (two) times daily.  Marland Kitchen ELIQUIS 5 MG TABS tablet Take 5 mg by mouth every 12 (twelve) hours.  . Glucosamine-Chondroit-Vit C-Mn (GLUCOSAMINE CHONDR 1500 COMPLX) CAPS Take 3-4 capsules by mouth daily. Takes 3-4 capsules daily   . levocetirizine (XYZAL) 5 MG tablet Take 1 tablet (5 mg total) by mouth every evening.  Marland Kitchen levothyroxine (EUTHYROX) 50 MCG tablet TAKE 1 TABLET BY MOUTH ONCE DAILY BEFORE BREAKFAST  .  nitroGLYCERIN (NITROSTAT) 0.3 MG SL tablet Place 1 tablet (0.3 mg total) under the tongue every 5 (five) minutes as needed for chest pain.  Glory Rosebush ULTRA test strip USE ONE STRIP TO CHECK GLUCOSE ONCE DAILY  . potassium chloride (KLOR-CON) 10 MEQ tablet Take 1 tablet (10 mEq total) by mouth 2 (two) times daily.  . predniSONE (DELTASONE) 20 MG tablet Take 1 tablet (20 mg total) by mouth 2 (two) times daily with a meal.  . PROCTO-MED HC 2.5 % rectal cream APPLY  CREAM RECTALLY TO AFFECTED AREA TWICE DAILY  . St Johns Wort 300 MG CAPS Take 300 mg by mouth in the morning, at noon, and at bedtime.   . triamterene-hydrochlorothiazide (MAXZIDE-25) 37.5-25 MG tablet Take 1 tablet by mouth daily.  Marland Kitchen albuterol (PROAIR HFA) 108 (90 Base) MCG/ACT inhaler Inhale 2 puffs into the lungs every 6 (six) hours as needed. (Patient not taking: Reported on 02/28/2020)  . cyclobenzaprine (FLEXERIL) 5 MG tablet Take 1 tablet (5 mg total) by mouth 3 (three) times daily as needed for muscle spasms. (Patient not taking: Reported on 02/28/2020)  . fluticasone (FLONASE) 50 MCG/ACT nasal spray Use 2 spray(s) in each nostril once daily (Patient not taking: Reported on 02/28/2020)   No facility-administered medications prior to visit.    Review of Systems  Constitutional: Negative for appetite change, chills, fatigue and fever.  Respiratory: Negative for chest tightness and shortness of breath.   Cardiovascular: Negative for chest pain and palpitations.  Gastrointestinal: Negative for abdominal pain, nausea and vomiting.  Neurological: Negative for dizziness and weakness.     Objective    BP 127/62 (BP Location: Right Arm, Patient Position: Sitting, Cuff Size: Large)   Pulse 68   Temp 98.4 F (36.9 C) (Oral)   Resp 18   Ht 5\' 2"  (1.575 m)   Wt 160 lb (72.6 kg)   SpO2 98%   BMI 29.26 kg/m   Physical Exam    General: Appearance:     Overweight female in no acute distress  Eyes:    PERRL, conjunctiva/corneas  clear, EOM's intact       Lungs:     Clear to auscultation bilaterally, respirations unlabored  Heart:    Normal heart rate. Regular rhythm. No murmurs, rubs, or gallops.   MS:   All extremities are intact.   Neurologic:   Awake, alert, oriented x 3. No apparent focal neurological           defect.           Assessment & Plan     1. Bronchitis Resolved since completely azithromycin. She back on Advair inhaler as well.   2. Type 2 diabetes mellitus without complication, without long-term current use of insulin (HCC) Off metformin since last year with home sugars slowing rising to upper 100s and low 200s.  restart- metFORMIN (GLUCOPHAGE-XR) 500 MG 24 hr tablet; TAKE ONE TABLET BY MOUTH ONCE DAILY FOR  BLOOD  SUGAR  Dispense: 90 tablet; Refill: 2  3. Uncomplicated asthma, unspecified asthma severity, unspecified whether persistent Stable, back on maintenance inhaler.   She anticipates getting flu shot at her pharmacy.    No follow-ups on file.      The entirety of the information documented in the History of Present Illness, Review of Systems and Physical Exam were personally obtained by me. Portions of this information were initially documented by the CMA and reviewed by me for thoroughness and accuracy.      Lelon Huh, MD  [facility] [phone] (phone) [phone] (fax)  [place]

## 2020-04-08 ENCOUNTER — Other Ambulatory Visit: Payer: Self-pay | Admitting: Family Medicine

## 2020-04-08 NOTE — Telephone Encounter (Signed)
Requested Prescriptions  Pending Prescriptions Disp Refills  . atorvastatin (LIPITOR) 40 MG tablet [Pharmacy Med Name: Atorvastatin Calcium 40 MG Oral Tablet] 90 tablet 3    Sig: TAKE 1 TABLET BY MOUTH ONCE DAILY AT  6PM     Cardiovascular:  Antilipid - Statins Failed - 04/08/2020  5:15 PM      Failed - LDL in normal range and within 360 days    LDL Chol Calc (NIH)  Date Value Ref Range Status  01/23/2020 87 0 - 99 mg/dL Final         Passed - Total Cholesterol in normal range and within 360 days    Cholesterol, Total  Date Value Ref Range Status  01/23/2020 165 100 - 199 mg/dL Final         Passed - HDL in normal range and within 360 days    HDL  Date Value Ref Range Status  01/23/2020 58 >39 mg/dL Final         Passed - Triglycerides in normal range and within 360 days    Triglycerides  Date Value Ref Range Status  01/23/2020 113 0 - 149 mg/dL Final         Passed - Patient is not pregnant      Passed - Valid encounter within last 12 months    Recent Outpatient Visits          2 weeks ago [place], Donald E, MD   1 month ago [place], Donald E, MD   1 month ago Allergic rhinitis, unspecified seasonality, unspecified trigger   [place], Adriana M, PA-C   2 months ago Annual physical exam   [facility] Birdie Sons, MD   6 months ago Type 2 diabetes mellitus without complication, unspecified whether long term insulin use [facility])   [facility] Birdie Sons, MD      Future Appointments            In 3 months Fisher, Kirstie Peri, MD [facility], [place]

## 2020-04-23 DIAGNOSIS — I63532 Cerebral infarction due to unspecified occlusion or stenosis of left posterior cerebral artery: Secondary | ICD-10-CM | POA: Diagnosis not present

## 2020-05-14 ENCOUNTER — Other Ambulatory Visit: Payer: Self-pay

## 2020-05-14 ENCOUNTER — Other Ambulatory Visit
Admission: RE | Admit: 2020-05-14 | Discharge: 2020-05-14 | Disposition: A | Discharge status: Home / Self Care | Payer: PPO | Source: Ambulatory Visit | Attending: Internal Medicine | Admitting: Internal Medicine

## 2020-05-14 DIAGNOSIS — Z01818 Encounter for other preprocedural examination: Secondary | ICD-10-CM | POA: Insufficient documentation

## 2020-05-14 DIAGNOSIS — Z20822 Contact with and (suspected) exposure to covid-19: Secondary | ICD-10-CM | POA: Diagnosis not present

## 2020-05-15 ENCOUNTER — Encounter: Payer: Self-pay | Admitting: Internal Medicine

## 2020-05-15 LAB — SARS CORONAVIRUS 2 (TAT 6-24 HRS): SARS Coronavirus 2: NEGATIVE

## 2020-05-16 ENCOUNTER — Ambulatory Visit: Payer: PPO | Admitting: Anesthesiology

## 2020-05-16 ENCOUNTER — Ambulatory Visit
Admission: RE | Admit: 2020-05-16 | Discharge: 2020-05-16 | Disposition: A | Discharge status: Home / Self Care | Payer: PPO | Attending: Internal Medicine | Admitting: Internal Medicine

## 2020-05-16 ENCOUNTER — Encounter
Admission: RE | Disposition: A | Discharge status: Home / Self Care | Payer: Self-pay | Source: Home / Self Care | Attending: Internal Medicine

## 2020-05-16 DIAGNOSIS — E782 Mixed hyperlipidemia: Secondary | ICD-10-CM | POA: Diagnosis not present

## 2020-05-16 DIAGNOSIS — Z79899 Other long term (current) drug therapy: Secondary | ICD-10-CM | POA: Diagnosis not present

## 2020-05-16 DIAGNOSIS — Z7984 Long term (current) use of oral hypoglycemic drugs: Secondary | ICD-10-CM | POA: Diagnosis not present

## 2020-05-16 DIAGNOSIS — E1159 Type 2 diabetes mellitus with other circulatory complications: Secondary | ICD-10-CM | POA: Diagnosis not present

## 2020-05-16 DIAGNOSIS — Z882 Allergy status to sulfonamides status: Secondary | ICD-10-CM | POA: Diagnosis not present

## 2020-05-16 DIAGNOSIS — Z86010 Personal history of colonic polyps: Secondary | ICD-10-CM | POA: Insufficient documentation

## 2020-05-16 DIAGNOSIS — Z7952 Long term (current) use of systemic steroids: Secondary | ICD-10-CM | POA: Insufficient documentation

## 2020-05-16 DIAGNOSIS — K642 Third degree hemorrhoids: Secondary | ICD-10-CM | POA: Insufficient documentation

## 2020-05-16 DIAGNOSIS — Z1211 Encounter for screening for malignant neoplasm of colon: Secondary | ICD-10-CM | POA: Diagnosis not present

## 2020-05-16 DIAGNOSIS — K449 Diaphragmatic hernia without obstruction or gangrene: Secondary | ICD-10-CM | POA: Insufficient documentation

## 2020-05-16 DIAGNOSIS — K222 Esophageal obstruction: Secondary | ICD-10-CM | POA: Diagnosis not present

## 2020-05-16 DIAGNOSIS — R1310 Dysphagia, unspecified: Secondary | ICD-10-CM | POA: Diagnosis not present

## 2020-05-16 DIAGNOSIS — E039 Hypothyroidism, unspecified: Secondary | ICD-10-CM | POA: Diagnosis not present

## 2020-05-16 DIAGNOSIS — Z7951 Long term (current) use of inhaled steroids: Secondary | ICD-10-CM | POA: Diagnosis not present

## 2020-05-16 DIAGNOSIS — K5730 Diverticulosis of large intestine without perforation or abscess without bleeding: Secondary | ICD-10-CM | POA: Insufficient documentation

## 2020-05-16 DIAGNOSIS — I4891 Unspecified atrial fibrillation: Secondary | ICD-10-CM | POA: Insufficient documentation

## 2020-05-16 DIAGNOSIS — I10 Essential (primary) hypertension: Secondary | ICD-10-CM | POA: Diagnosis not present

## 2020-05-16 DIAGNOSIS — K5790 Diverticulosis of intestine, part unspecified, without perforation or abscess without bleeding: Secondary | ICD-10-CM | POA: Diagnosis not present

## 2020-05-16 DIAGNOSIS — K649 Unspecified hemorrhoids: Secondary | ICD-10-CM | POA: Diagnosis not present

## 2020-05-16 HISTORY — PX: ESOPHAGOGASTRODUODENOSCOPY (EGD) WITH PROPOFOL: SHX5813

## 2020-05-16 HISTORY — DX: Cerebral infarction, unspecified: I63.9

## 2020-05-16 HISTORY — PX: COLONOSCOPY WITH PROPOFOL: SHX5780

## 2020-05-16 HISTORY — DX: Polyp of colon: K63.5

## 2020-05-16 HISTORY — DX: Cystitis, unspecified without hematuria: N30.90

## 2020-05-16 HISTORY — DX: Type 2 diabetes mellitus with ketoacidosis without coma: E11.10

## 2020-05-16 HISTORY — DX: Diverticulosis of intestine, part unspecified, without perforation or abscess without bleeding: K57.90

## 2020-05-16 LAB — GLUCOSE, CAPILLARY: Glucose-Capillary: 118 mg/dL — ABNORMAL HIGH (ref 70–99)

## 2020-05-16 SURGERY — COLONOSCOPY WITH PROPOFOL
Anesthesia: General

## 2020-05-16 MED ORDER — PROPOFOL 10 MG/ML IV BOLUS
INTRAVENOUS | Status: DC | PRN
  Administered 2020-05-16: 60 mg via INTRAVENOUS
  Administered 2020-05-16 (×3): 30 mg via INTRAVENOUS

## 2020-05-16 MED ORDER — LIDOCAINE HCL (CARDIAC) PF 100 MG/5ML IV SOSY
PREFILLED_SYRINGE | INTRAVENOUS | Status: DC | PRN
  Administered 2020-05-16: 80 mg via INTRAVENOUS

## 2020-05-16 MED ORDER — PROPOFOL 500 MG/50ML IV EMUL
INTRAVENOUS | Status: DC | PRN
  Administered 2020-05-16: 50 ug/kg/min via INTRAVENOUS

## 2020-05-16 MED ORDER — SODIUM CHLORIDE 0.9 % IV SOLN
INTRAVENOUS | Status: DC
  Administered 2020-05-16: 20 mL/h via INTRAVENOUS

## 2020-05-16 NOTE — Op Note (Signed)
[facility] Gastroenterology Patient Name: Destiny Gilbert Procedure Date: 05/16/2020 12:41 PM MRN: [id number] Account #: [id number] Date of Birth: [birth year] Admit Type: Outpatient Age: 79 Room: [facility] ENDO ROOM 2 Gender: Female Note Status: Finalized Procedure:             Upper GI endoscopy Indications:           Dysphagia, Abnormal cine-esophagram Providers:             Boykin Nearing. Anastashia Westerfeld MD, MD Medicines:             Propofol per Anesthesia Complications:         No immediate complications. Procedure:             Pre-Anesthesia Assessment:                        - The risks and benefits of the procedure and the                         sedation options and risks were discussed with the                         patient. All questions were answered and informed                         consent was obtained.                        - Patient identification and proposed procedure were                         verified prior to the procedure by the nurse. The                         procedure was verified in the procedure room.                        - ASA Grade Assessment: III - A patient with severe                         systemic disease.                        - After reviewing the risks and benefits, the patient                         was deemed in satisfactory condition to undergo the                         procedure.                        After obtaining informed consent, the endoscope was                         passed under direct vision. Throughout the procedure,                         the patient's blood pressure, pulse, and oxygen  saturations were monitored continuously. The Endoscope                         was introduced through the mouth, and advanced to the                         third part of duodenum. The upper GI endoscopy was                         somewhat difficult due to stricture. Successful                          completion of the procedure was aided by performing                         the maneuvers documented (below) in this report. The                         patient tolerated the procedure well. Findings:      One benign-appearing, intrinsic severe stenosis was found in the distal       esophagus. This stenosis measured 9 mm (inner diameter) x less than one       cm (in length). The stenosis was traversed after dilation. A TTS dilator       was passed through the scope. Dilation with a 04-17-11 mm balloon       dilator was performed to 12 mm. The dilation site was examined following       endoscope reinsertion and showed moderate improvement in luminal       narrowing. Estimated blood loss was minimal.      A 2 cm hiatal hernia was present.      The examined duodenum was normal.      The exam was otherwise without abnormality. Impression:            - Benign-appearing esophageal stenosis. Dilated.                        - 2 cm hiatal hernia.                        - Normal examined duodenum.                        - The examination was otherwise normal.                        - No specimens collected. Recommendation:        - Monitor results to esophageal dilation                        - Proceed with colonoscopy Procedure Code(s):     --- Professional ---                        [phone], Esophagogastroduodenoscopy, flexible,                         transoral; with transendoscopic balloon dilation of  esophagus (less than 30 mm diameter) Diagnosis Code(s):     --- Professional ---                        R93.3, Abnormal findings on diagnostic imaging of                         other parts of digestive tract                        R13.10, Dysphagia, unspecified                        K44.9, Diaphragmatic hernia without obstruction or                         gangrene                        K22.2, Esophageal obstruction CPT copyright 2019 American Medical Association. All  rights reserved. The codes documented in this report are preliminary and upon coder review may  be revised to meet current compliance requirements. Stanton Kidney MD, MD 05/16/2020 1:41:03 PM This report has been signed electronically. Number of Addenda: 0 Note Initiated On: 05/16/2020 12:41 PM Estimated Blood Loss:  Estimated blood loss: none. Estimated blood loss was                         minimal.      [facility]

## 2020-05-16 NOTE — H&P (Signed)
Outpatient short stay form Pre-procedure 05/16/2020 1:27 PM Destiny Gilbert K. Destiny Gilbert, [name]  Primary Physician: Destiny Gilbert,[name]  Reason for visit:  Dysphagia, Hx of adenomatous colon polyps  History of present illness:  79 y/o female patient has a long history of dysphagia with previous diagnosis of esophageal strictures and abnormal barium esophagram. No hemetemesis, weight loss, abdominal pain.                           Patient presents for colonoscopy for a personal hx of colon polyps. The patient denies abdominal pain, abnormal weight loss or rectal bleeding.  patien takes Eliquis for atrial fibrillation but has not taken it in 5 days.    Current Facility-Administered Medications:  .  0.9 %  sodium chloride infusion, , Intravenous, Continuous, [place], Benay Pike, MD, Last Rate: 20 mL/hr at 05/16/20 1213, 20 mL/hr at 05/16/20 1213  Medications Prior to Admission  Medication Sig Dispense Refill Last Dose  . acetaminophen (TYLENOL) 500 MG tablet Take 500 mg by mouth every 6 (six) hours as needed.   Past Week at Unknown time  . ADVAIR DISKUS 250-50 MCG/DOSE AEPB INHALE 1 DOSE BY MOUTH TWICE DAILY 180 each 1 Past Week at Unknown time  . albuterol (PROAIR HFA) 108 (90 Base) MCG/ACT inhaler Inhale 2 puffs into the lungs every 6 (six) hours as needed. 1 Inhaler 2 Past Week at Unknown time  . atorvastatin (LIPITOR) 40 MG tablet TAKE 1 TABLET BY MOUTH ONCE DAILY AT  6PM 90 tablet 3 Past Week at Unknown time  . cefdinir (OMNICEF) 300 MG capsule Take 1 capsule (300 mg total) by mouth 2 (two) times daily. 14 capsule 0 Past Week at Unknown time  . cyclobenzaprine (FLEXERIL) 5 MG tablet Take 1 tablet (5 mg total) by mouth 3 (three) times daily as needed for muscle spasms. 30 tablet 1 Past Week at Unknown time  . ELIQUIS 5 MG TABS tablet Take 5 mg by mouth every 12 (twelve) hours.  11 Past Week at Unknown time  . fluticasone (FLONASE) 50 MCG/ACT nasal spray Use 2 spray(s) in each nostril once daily 16 g 0  Past Week at Unknown time  . Glucosamine-Chondroit-Vit C-Mn (GLUCOSAMINE CHONDR 1500 COMPLX) CAPS Take 3-4 capsules by mouth daily. Takes 3-4 capsules daily    Past Week at Unknown time  . levocetirizine (XYZAL) 5 MG tablet Take 1 tablet (5 mg total) by mouth every evening. 90 tablet 1 Past Week at Unknown time  . levothyroxine (EUTHYROX) 50 MCG tablet TAKE 1 TABLET BY MOUTH ONCE DAILY BEFORE BREAKFAST 90 tablet 4 Past Week at Unknown time  . Magnesium 250 MG TABS Take by mouth daily.   Past Week at Unknown time  . metFORMIN (GLUCOPHAGE-XR) 500 MG 24 hr tablet TAKE ONE TABLET BY MOUTH ONCE DAILY FOR  BLOOD  SUGAR 90 tablet 2 Past Week at Unknown time  . montelukast (SINGULAIR) 10 MG tablet Take 10 mg by mouth at bedtime.   Past Week at Unknown time  . nitroGLYCERIN (NITROSTAT) 0.3 MG SL tablet Place 1 tablet (0.3 mg total) under the tongue every 5 (five) minutes as needed for chest pain. 30 tablet 0 Past Week at Unknown time  . omeprazole (PRILOSEC) 20 MG capsule Take 20 mg by mouth daily.   Past Week at Unknown time  . ONETOUCH ULTRA test strip USE ONE STRIP TO CHECK GLUCOSE ONCE DAILY 100 each 1 Past Week at Unknown time  .  potassium chloride (KLOR-CON) 10 MEQ tablet Take 1 tablet (10 mEq total) by mouth 2 (two) times daily. 180 tablet 4 05/15/2020 at Unknown time  . predniSONE (DELTASONE) 20 MG tablet Take 1 tablet (20 mg total) by mouth 2 (two) times daily with a meal. 10 tablet 0 Past Week at Unknown time  . PROCTO-MED HC 2.5 % rectal cream APPLY  CREAM RECTALLY TO AFFECTED AREA TWICE DAILY 28 g 4 Past Week at Unknown time  . St Johns Wort 300 MG CAPS Take 300 mg by mouth in the morning, at noon, and at bedtime.    Past Week at Unknown time  . triamterene-hydrochlorothiazide (MAXZIDE-25) 37.5-25 MG tablet Take 1 tablet by mouth daily. 90 tablet 4 05/15/2020 at Unknown time     Allergies  Allergen Reactions  . Dipyridamole Nausea Only    Headache  . Sulfa Antibiotics Nausea And Vomiting      Past Medical History:  Diagnosis Date  . Bladder infection   . Diabetes mellitus without complication ([place])   . 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 Diverticulosis   . History of peptic ulcer    perforated  . Hyperlipidemia   . Hyperplastic colon polyp   . Hypertension   . Pneumonia 01/23/2015   [facility]   . Stroke [facility])     Review of systems:  Otherwise negative.    Physical Exam  Gen: Alert, oriented. Appears stated age.  HEENT: [place]/AT. PERRLA. Lungs: CTA, no wheezes. CV: RR nl S1, S2. Abd: soft, benign, no masses. BS+ Ext: No edema. Pulses 2+    Planned procedures: Proceed with EGD and colonoscopy. The patient understands the nature of the planned procedure, indications, risks, alternatives and potential complications including but not limited to bleeding, infection, perforation, damage to internal organs and possible oversedation/side effects from anesthesia. The patient agrees and gives consent to proceed.  Please refer to procedure notes for findings, recommendations and patient disposition/instructions.     Rector Devonshire K. Destiny Gilbert, [name] Gastroenterology 05/16/2020  1:27 PM

## 2020-05-16 NOTE — Anesthesia Preprocedure Evaluation (Signed)
Anesthesia Evaluation  Patient identified by MRN, date of birth, ID band Patient awake    Reviewed: Allergy & Precautions, NPO status , Patient's Chart, lab work & pertinent test results  History of Anesthesia Complications Negative for: history of anesthetic complications  Airway Mallampati: III  TM Distance: >3 FB Neck ROM: Full    Dental no notable dental hx.    Pulmonary asthma , neg sleep apnea,    breath sounds clear to auscultation- rhonchi (-) wheezing      Cardiovascular hypertension, Pt. on medications (-) CAD, (-) Past MI, (-) Cardiac Stents and (-) CABG  Rhythm:Regular Rate:Normal - Systolic murmurs and - Diastolic murmurs    Neuro/Psych neg Seizures CVA, No Residual Symptoms negative psych ROS   GI/Hepatic hiatal hernia, PUD,   Endo/Other  diabetes, Oral Hypoglycemic AgentsHypothyroidism   Renal/GU negative Renal ROS     Musculoskeletal negative musculoskeletal ROS (+)   Abdominal (+) - obese,   Peds  Hematology negative hematology ROS (+)   Anesthesia Other Findings Past Medical History: No date: Bladder infection No date: Diabetes mellitus without complication (HCC) No date: Diabetic acidosis (HCC) No date: Diverticulosis No date: History of peptic ulcer     Comment:  perforated No date: Hyperlipidemia No date: Hyperplastic colon polyp No date: Hypertension 01/23/2015: Pneumonia     Comment:  [facility]  No date: Stroke [facility])   Reproductive/Obstetrics                            Anesthesia Physical Anesthesia Plan  ASA: III  Anesthesia Plan: General   Post-op Pain Management:    Induction: Intravenous  PONV Risk Score and Plan: 2 and Propofol infusion  Airway Management Planned: Natural Airway  Additional Equipment:   Intra-op Plan:   Post-operative Plan:   Informed Consent: I have reviewed the patients History and Physical, chart, labs and discussed the  procedure including the risks, benefits and alternatives for the proposed anesthesia with the patient or authorized representative who has indicated his/her understanding and acceptance.     Dental advisory given  Plan Discussed with: CRNA and Anesthesiologist  Anesthesia Plan Comments:         Anesthesia Quick Evaluation

## 2020-05-16 NOTE — Transfer of Care (Signed)
Immediate Anesthesia Transfer of Care Note  Patient: Destiny Gilbert  Procedure(s) Performed: COLONOSCOPY WITH PROPOFOL (N/A ) ESOPHAGOGASTRODUODENOSCOPY (EGD) WITH PROPOFOL (N/A )  Patient Location: PACU and Endoscopy Unit  Anesthesia Type:General  Level of Consciousness: awake, drowsy and patient cooperative  Airway & Oxygen Therapy: Patient Spontanous Breathing  Post-op Assessment: Report given to RN and Post -op Vital signs reviewed and stable  Post vital signs: Reviewed and stable  Last Vitals:  Vitals Value Taken Time  BP 125/72 05/16/20 1401  Temp 36.8 C 05/16/20 1400  Pulse 64 05/16/20 1401  Resp 15 05/16/20 1401  SpO2 96 % 05/16/20 1401  Vitals shown include unvalidated device data.  Last Pain:  Vitals:   05/16/20 1400  TempSrc: Temporal  PainSc: 0-No pain         Complications: No complications documented.

## 2020-05-16 NOTE — Interval H&P Note (Signed)
History and Physical Interval Note:  05/16/2020 1:29 PM  Destiny Gilbert  has presented today for surgery, with the diagnosis of P HX ADEN POLYPS DYSPHAGIA.  The various methods of treatment have been discussed with the patient and family. After consideration of risks, benefits and other options for treatment, the patient has consented to  Procedure(s): COLONOSCOPY WITH PROPOFOL (N/A) ESOPHAGOGASTRODUODENOSCOPY (EGD) WITH PROPOFOL (N/A) as a surgical intervention.  The patient's history has been reviewed, patient examined, no change in status, stable for surgery.  I have reviewed the patient's chart and labs.  Questions were answered to the patient's satisfaction.     [place], [place]

## 2020-05-16 NOTE — Op Note (Signed)
[facility] Gastroenterology Patient Name: Destiny Gilbert Procedure Date: 05/16/2020 12:41 PM MRN: [id number] Account #: [id number] Date of Birth: [birth year] Admit Type: Outpatient Age: 79 Room: [facility] ENDO ROOM 2 Gender: Female Note Status: Finalized Procedure:             Colonoscopy Indications:           High risk colon cancer surveillance: Personal history                         of colonic polyps Providers:             Boykin Nearing. Veldon Wager MD, MD Medicines:             Propofol per Anesthesia Complications:         No immediate complications. Procedure:             Pre-Anesthesia Assessment:                        - The risks and benefits of the procedure and the                         sedation options and risks were discussed with the                         patient. All questions were answered and informed                         consent was obtained.                        - Patient identification and proposed procedure were                         verified prior to the procedure by the nurse. The                         procedure was verified in the procedure room.                        - ASA Grade Assessment: III - A patient with severe                         systemic disease.                        - After reviewing the risks and benefits, the patient                         was deemed in satisfactory condition to undergo the                         procedure.                        After obtaining informed consent, the colonoscope was                         passed under direct vision. Throughout the procedure,  the patient's blood pressure, pulse, and oxygen                         saturations were monitored continuously. The                         Colonoscope was introduced through the anus and                         advanced to the the cecum, identified by appendiceal                         orifice and ileocecal valve. The  colonoscopy was                         performed without difficulty. The patient tolerated                         the procedure well. The quality of the bowel                         preparation was good. The ileocecal valve, appendiceal                         orifice, and rectum were photographed. Findings:      The perianal exam findings include internal hemorrhoids that prolapse       with straining, but require manual replacement into the anal canal       (Grade III).      Non-bleeding internal hemorrhoids were found during retroflexion. The       hemorrhoids were Grade III (internal hemorrhoids that prolapse but       require manual reduction).      Multiple small and large-mouthed diverticula were found in the sigmoid       colon.      There is no endoscopic evidence of polyps in the entire colon.      Normal mucosa was found in the entire colon. Impression:            - Internal hemorrhoids that prolapse with straining,                         but require manual replacement into the anal canal                         (Grade III) found on perianal exam.                        - Non-bleeding internal hemorrhoids.                        - Diverticulosis in the sigmoid colon.                        - Normal mucosa in the entire examined colon.                        - No specimens collected. Recommendation:        - Monitor results to esophageal dilation                        -  Patient has a contact number available for                         emergencies. The signs and symptoms of potential                         delayed complications were discussed with the patient.                         Return to normal activities tomorrow. Written                         discharge instructions were provided to the patient.                        - Resume previous diet.                        - Continue present medications.                        - No repeat colonoscopy due to current age  (37 years                         or older) and the absence of colonic polyps.                        - The findings and recommendations were discussed with                         the patient.                        - Return to physician assistant in 6 weeks. Procedure Code(s):     --- Professional ---                        W2956, Colorectal cancer screening; colonoscopy on                         individual at high risk Diagnosis Code(s):     --- Professional ---                        K57.30, Diverticulosis of large intestine without                         perforation or abscess without bleeding                        K64.2, Third degree hemorrhoids                        Z86.010, Personal history of colonic polyps CPT copyright 2019 American Medical Association. All rights reserved. The codes documented in this report are preliminary and upon coder review may  be revised to meet current compliance requirements. Stanton Kidney MD, MD 05/16/2020 2:02:10 PM This report has been signed electronically. Number of Addenda: 0 Note Initiated On: 05/16/2020 12:41 PM Scope Withdrawal Time: 0 hours 5 minutes 39 seconds  Total Procedure Duration: 0 hours 12 minutes 40 seconds  Estimated Blood Loss:  Estimated blood loss: none. Estimated blood loss: none.      [facility]

## 2020-05-17 ENCOUNTER — Encounter: Payer: Self-pay | Admitting: Internal Medicine

## 2020-05-17 DIAGNOSIS — Z8673 Personal history of transient ischemic attack (TIA), and cerebral infarction without residual deficits: Secondary | ICD-10-CM | POA: Diagnosis not present

## 2020-05-17 DIAGNOSIS — Z95818 Presence of other cardiac implants and grafts: Secondary | ICD-10-CM | POA: Diagnosis not present

## 2020-05-17 DIAGNOSIS — I10 Essential (primary) hypertension: Secondary | ICD-10-CM | POA: Diagnosis not present

## 2020-05-17 DIAGNOSIS — E7800 Pure hypercholesterolemia, unspecified: Secondary | ICD-10-CM | POA: Diagnosis not present

## 2020-05-17 DIAGNOSIS — Z23 Encounter for immunization: Secondary | ICD-10-CM | POA: Diagnosis not present

## 2020-05-17 DIAGNOSIS — I480 Paroxysmal atrial fibrillation: Secondary | ICD-10-CM | POA: Diagnosis not present

## 2020-05-17 NOTE — Anesthesia Postprocedure Evaluation (Signed)
Anesthesia Post Note  Patient: Destiny Gilbert  Procedure(s) Performed: COLONOSCOPY WITH PROPOFOL (N/A ) ESOPHAGOGASTRODUODENOSCOPY (EGD) WITH PROPOFOL (N/A )  Patient location during evaluation: Endoscopy Anesthesia Type: General Level of consciousness: awake Pain management: pain level controlled Vital Signs Assessment: post-procedure vital signs reviewed and stable Respiratory status: spontaneous breathing Cardiovascular status: blood pressure returned to baseline Postop Assessment: no headache and no apparent nausea or vomiting Anesthetic complications: no   No complications documented.   Last Vitals:  Vitals:   05/16/20 1400 05/16/20 1420  BP: 125/72 (!) 151/72  Pulse:    Resp:    Temp: 36.8 C   SpO2:      Last Pain:  Vitals:   05/16/20 1420  TempSrc:   PainSc: 0-No pain                 Neva Seat

## 2020-07-10 ENCOUNTER — Other Ambulatory Visit: Payer: Self-pay | Admitting: Family Medicine

## 2020-07-10 DIAGNOSIS — Z1231 Encounter for screening mammogram for malignant neoplasm of breast: Secondary | ICD-10-CM

## 2020-07-24 ENCOUNTER — Ambulatory Visit: Payer: Self-pay | Admitting: Family Medicine

## 2020-07-27 ENCOUNTER — Ambulatory Visit: Payer: Self-pay | Admitting: Family Medicine

## 2020-08-01 ENCOUNTER — Ambulatory Visit (INDEPENDENT_AMBULATORY_CARE_PROVIDER_SITE_OTHER): Payer: PPO | Admitting: Family Medicine

## 2020-08-01 ENCOUNTER — Other Ambulatory Visit: Payer: Self-pay

## 2020-08-01 VITALS — BP 142/80 | HR 82 | Temp 97.9°F | Ht 62.0 in | Wt 165.2 lb

## 2020-08-01 DIAGNOSIS — I480 Paroxysmal atrial fibrillation: Secondary | ICD-10-CM

## 2020-08-01 DIAGNOSIS — E1159 Type 2 diabetes mellitus with other circulatory complications: Secondary | ICD-10-CM

## 2020-08-01 DIAGNOSIS — I10 Essential (primary) hypertension: Secondary | ICD-10-CM | POA: Diagnosis not present

## 2020-08-01 LAB — POCT GLYCOSYLATED HEMOGLOBIN (HGB A1C)
Estimated Average Glucose: 140
Hemoglobin A1C: 6.5 % — AB (ref 4.0–5.6)

## 2020-08-01 NOTE — Progress Notes (Signed)
Established patient visit   Patient: Destiny Gilbert   DOB: 09/27/1940   80 y.o. Female  MRN: [id number] Visit Date: 08/01/2020  Today's healthcare provider: Lelon Huh, MD   No chief complaint on file.  Subjective    HPI  Diabetes Mellitus Type II, Follow-up  Lab Results  Component Value Date   HGBA1C 6.3 (H) 01/23/2020   HGBA1C 6.3 (A) 09/01/2019   HGBA1C 6.3 (H) 01/12/2019   Wt Readings from Last 3 Encounters:  05/16/20 157 lb (71.2 kg)  03/23/20 160 lb (72.6 kg)  01/24/20 162 lb (73.5 kg)   Last seen for diabetes 4 months ago.  Management since then includes restarting Metformin 500mg  daily. She reports excellent compliance with treatment. She is not having side effects.  Symptoms: No fatigue No foot ulcerations  No appetite changes No nausea  No paresthesia of the feet  No polydipsia  No polyuria No visual disturbances   No vomiting     Home blood sugar records: fasting range: 117-149  Episodes of hypoglycemia? No    Current insulin regiment: none Most Recent Eye Exam: 08/24/2019 Current exercise: none Current diet habits: in general, a "healthy" diet    Pertinent Labs: Lab Results  Component Value Date   CHOL 165 01/23/2020   HDL 58 01/23/2020   LDLCALC 87 01/23/2020   TRIG 113 01/23/2020   CHOLHDL 2.8 01/23/2020   Lab Results  Component Value Date   NA 142 01/23/2020   K 3.6 01/23/2020   CREATININE 0.90 01/23/2020   GFRNONAA 62 01/23/2020   GFRAA 71 01/23/2020   GLUCOSE 102 (H) 01/23/2020     ---------------------------------------------------------------------------------------------------  Hypertension, follow-up  BP Readings from Last 3 Encounters:  05/16/20 (!) 151/72  03/23/20 127/62  01/24/20 136/60   Wt Readings from Last 3 Encounters:  05/16/20 157 lb (71.2 kg)  03/23/20 160 lb (72.6 kg)  01/24/20 162 lb (73.5 kg)     She was last seen for hypertension 6 months ago.  BP at that visit was 136/60. Management since  that visit includes continue same medications.   She reports excellent compliance with treatment. She is not having side effects.  She is following a Regular diet. She is not exercising. She does not smoke.  Use of agents associated with hypertension: thyroid hormones.   Outside blood pressures are N/A.  The ASCVD Risk score Mikey Bussing DC Jr., et al., 2013) failed to calculate for the following reasons:   The patient has a prior MI or stroke diagnosis   ---------------------------------------------------------------------------------------------------  Hypothyroid, follow-up  Lab Results  Component Value Date   TSH 2.420 01/23/2020   TSH 1.570 01/12/2019   TSH 0.830 08/30/2018   FREET4 1.06 01/23/2020   Wt Readings from Last 3 Encounters:  05/16/20 157 lb (71.2 kg)  03/23/20 160 lb (72.6 kg)  01/24/20 162 lb (73.5 kg)    She was last seen for hypothyroid 6 months ago.  Management since that visit includes continue same medications. She reports excellent compliance with treatment. She is not having side effects.   Symptoms: Yes change in energy level No constipation  No diarrhea No heat / cold intolerance  No nervousness No palpitations  No weight changes    -----------------------------------------------------------------------------------------    Medications: Outpatient Medications Prior to Visit  Medication Sig  . acetaminophen (TYLENOL) 500 MG tablet Take 500 mg by mouth every 6 (six) hours as needed.  Marland Kitchen ADVAIR DISKUS 250-50 MCG/DOSE AEPB INHALE 1 DOSE BY MOUTH  TWICE DAILY  . albuterol (PROAIR HFA) 108 (90 Base) MCG/ACT inhaler Inhale 2 puffs into the lungs every 6 (six) hours as needed.  Marland Kitchen atorvastatin (LIPITOR) 40 MG tablet TAKE 1 TABLET BY MOUTH ONCE DAILY AT  6PM  . ELIQUIS 5 MG TABS tablet Take 5 mg by mouth every 12 (twelve) hours.  . fluticasone (FLONASE) 50 MCG/ACT nasal spray Use 2 spray(s) in each nostril once daily  . Glucosamine-Chondroit-Vit C-Mn  (GLUCOSAMINE CHONDR 1500 COMPLX) CAPS Take 3-4 capsules by mouth daily. Takes 3-4 capsules daily  . levocetirizine (XYZAL) 5 MG tablet Take 1 tablet (5 mg total) by mouth every evening.  Marland Kitchen levothyroxine (EUTHYROX) 50 MCG tablet TAKE 1 TABLET BY MOUTH ONCE DAILY BEFORE BREAKFAST  . metFORMIN (GLUCOPHAGE-XR) 500 MG 24 hr tablet TAKE ONE TABLET BY MOUTH ONCE DAILY FOR  BLOOD  SUGAR  . montelukast (SINGULAIR) 10 MG tablet Take 10 mg by mouth at bedtime.  . nitroGLYCERIN (NITROSTAT) 0.3 MG SL tablet Place 1 tablet (0.3 mg total) under the tongue every 5 (five) minutes as needed for chest pain.  Glory Rosebush ULTRA test strip USE ONE STRIP TO CHECK GLUCOSE ONCE DAILY  . potassium chloride (KLOR-CON) 10 MEQ tablet Take 1 tablet (10 mEq total) by mouth 2 (two) times daily.  Marland Kitchen PROCTO-MED HC 2.5 % rectal cream APPLY  CREAM RECTALLY TO AFFECTED AREA TWICE DAILY  . St Johns Wort 300 MG CAPS Take 300 mg by mouth in the morning, at noon, and at bedtime.   . triamterene-hydrochlorothiazide (MAXZIDE-25) 37.5-25 MG tablet Take 1 tablet by mouth daily.  . Magnesium 250 MG TABS Take by mouth daily. (Patient not taking: Reported on 08/01/2020)  . omeprazole (PRILOSEC) 20 MG capsule Take 20 mg by mouth daily. (Patient not taking: Reported on 08/01/2020)  . [DISCONTINUED] cefdinir (OMNICEF) 300 MG capsule Take 1 capsule (300 mg total) by mouth 2 (two) times daily. (Patient not taking: Reported on 08/01/2020)  . [DISCONTINUED] cyclobenzaprine (FLEXERIL) 5 MG tablet Take 1 tablet (5 mg total) by mouth 3 (three) times daily as needed for muscle spasms. (Patient not taking: Reported on 08/01/2020)  . [DISCONTINUED] predniSONE (DELTASONE) 20 MG tablet Take 1 tablet (20 mg total) by mouth 2 (two) times daily with a meal.   No facility-administered medications prior to visit.       Objective    BP (!) 142/80   Pulse 82   Temp 97.9 F (36.6 C) (Oral)   Ht 5\' 2"  (1.575 m)   Wt 165 lb 3.2 oz (74.9 kg)   SpO2 98%   BMI 30.22  kg/m     Physical Exam  General appearance: Mildly obese female, cooperative and in no acute distress Head: Normocephalic, without obvious abnormality, atraumatic Respiratory: Respirations even and unlabored, normal respiratory rate Extremities: All extremities are intact.  Skin: Skin color, texture, turgor normal. No rashes seen  Psych: Appropriate mood and affect. Neurologic: Mental status: Alert, oriented to person, place, and time, thought content appropriate.   Results for orders placed or performed in visit on 08/01/20  POCT HgB A1C  Result Value Ref Range   Hemoglobin A1C 6.5 (A) 4.0 - 5.6 %   Estimated Average Glucose 140     Assessment & Plan     1. Type 2 diabetes mellitus with vascular disease ([place]) Very well controlled. Continue current medications.  Follow up 6 months.   2. Essential (primary) hypertension SBP mildly elevated, continue current medications for now. Work on [employer] and  increasing physical activity.   3. Intermittent atrial fibrillation ([facility]) Rate well controlled, on Eliquis, tolerating well, asymptomatic, Continue current medications.       She has had flu vaccine at pharmacy.      The entirety of the information documented in the History of Present Illness, Review of Systems and Physical Exam were personally obtained by me. Portions of this information were initially documented by the CMA and reviewed by me for thoroughness and accuracy.      Lelon Huh, MD  [facility] [phone] (phone) [phone] (fax)  [place]

## 2020-08-08 ENCOUNTER — Other Ambulatory Visit: Payer: Self-pay | Admitting: Family Medicine

## 2020-08-08 NOTE — Telephone Encounter (Signed)
Requested medication (s) are due for refill today: no  Requested medication (s) are on the active medication list: yes  Last refill: 02/21/2020  Future visit scheduled:yes  Notes to clinic:   Medication not assigned to a protocol, review manually.       Requested Prescriptions  Pending Prescriptions Disp Refills   PROCTO-MED HC 2.5 % rectal cream [Pharmacy Med Name: Procto-Med HC 2.5 % External Cream] 28 g 0    Sig: APPLY  CREAM RECTALLY TO AFFECTED AREA TWICE DAILY      Off-Protocol Failed - 08/08/2020  1:17 PM      Failed - Medication not assigned to a protocol, review manually.      Passed - Valid encounter within last 12 months    Recent Outpatient Visits           1 week ago Type 2 diabetes mellitus with vascular disease ([place])   [place], Donald E, MD   4 months ago [place], Donald E, MD   5 months ago [place], Donald E, MD   5 months ago Allergic rhinitis, unspecified seasonality, unspecified trigger   [place] [facility] Carles Collet M, PA-C   6 months ago Annual physical exam   [place], [place]       Future Appointments             In 5 months Fisher, Kirstie Peri, MD [facility], [place]:  OTC Passed - 08/08/2020  1:17 PM      Passed - Valid encounter within last 12 months    Recent Outpatient Visits           1 week ago Type 2 diabetes mellitus with vascular disease [facility])   [place] Birdie Sons, MD   4 months ago [place], Donald E, MD   5 months ago [place], Donald E, MD   5 months ago Allergic rhinitis, unspecified seasonality, unspecified trigger   [facility] Carles Collet M, PA-C   6 months ago Annual physical exam   [facility] Birdie Sons, MD       Future Appointments             In 5 months Fisher, Kirstie Peri, MD [facility], [place]

## 2020-08-16 ENCOUNTER — Other Ambulatory Visit: Payer: Self-pay | Admitting: Physician Assistant

## 2020-08-16 DIAGNOSIS — J45909 Unspecified asthma, uncomplicated: Secondary | ICD-10-CM

## 2020-08-16 DIAGNOSIS — J309 Allergic rhinitis, unspecified: Secondary | ICD-10-CM

## 2020-08-28 ENCOUNTER — Ambulatory Visit
Admission: RE | Admit: 2020-08-28 | Discharge: 2020-08-28 | Disposition: A | Discharge status: Home / Self Care | Payer: PPO | Source: Ambulatory Visit | Attending: Family Medicine | Admitting: Family Medicine

## 2020-08-28 ENCOUNTER — Other Ambulatory Visit: Payer: Self-pay

## 2020-08-28 DIAGNOSIS — Z1231 Encounter for screening mammogram for malignant neoplasm of breast: Secondary | ICD-10-CM | POA: Diagnosis not present

## 2020-08-28 DIAGNOSIS — L821 Other seborrheic keratosis: Secondary | ICD-10-CM | POA: Diagnosis not present

## 2020-08-28 DIAGNOSIS — D485 Neoplasm of uncertain behavior of skin: Secondary | ICD-10-CM | POA: Diagnosis not present

## 2020-08-28 DIAGNOSIS — L820 Inflamed seborrheic keratosis: Secondary | ICD-10-CM | POA: Diagnosis not present

## 2020-08-28 DIAGNOSIS — B078 Other viral warts: Secondary | ICD-10-CM | POA: Diagnosis not present

## 2020-08-28 DIAGNOSIS — L538 Other specified erythematous conditions: Secondary | ICD-10-CM | POA: Diagnosis not present

## 2020-08-28 DIAGNOSIS — D2262 Melanocytic nevi of left upper limb, including shoulder: Secondary | ICD-10-CM | POA: Diagnosis not present

## 2020-08-28 DIAGNOSIS — D2261 Melanocytic nevi of right upper limb, including shoulder: Secondary | ICD-10-CM | POA: Diagnosis not present

## 2020-08-28 DIAGNOSIS — D225 Melanocytic nevi of trunk: Secondary | ICD-10-CM | POA: Diagnosis not present

## 2020-09-14 ENCOUNTER — Encounter: Payer: Self-pay | Admitting: Adult Health

## 2020-09-14 ENCOUNTER — Ambulatory Visit (INDEPENDENT_AMBULATORY_CARE_PROVIDER_SITE_OTHER): Payer: PPO | Admitting: Adult Health

## 2020-09-14 DIAGNOSIS — H6690 Otitis media, unspecified, unspecified ear: Secondary | ICD-10-CM | POA: Insufficient documentation

## 2020-09-14 DIAGNOSIS — I63532 Cerebral infarction due to unspecified occlusion or stenosis of left posterior cerebral artery: Secondary | ICD-10-CM | POA: Diagnosis not present

## 2020-09-14 DIAGNOSIS — H60502 Unspecified acute noninfective otitis externa, left ear: Secondary | ICD-10-CM | POA: Diagnosis not present

## 2020-09-14 MED ORDER — AMOXICILLIN-POT CLAVULANATE 875-125 MG PO TABS: 1.0000 | ORAL_TABLET | Freq: Two times a day (BID) | ORAL | 0 refills | Status: DC

## 2020-09-14 MED ORDER — CIPROFLOXACIN-DEXAMETHASONE 0.3-0.1 % OT SUSP: 4.0000 [drp] | Freq: Two times a day (BID) | OTIC | 0 refills | Status: DC

## 2020-09-14 NOTE — Patient Instructions (Signed)
Ciprofloxacin; Dexamethasone ear suspension What is this medicine? CIPROFLOXACIN; DEXAMETHASONE (sip roe FLOX a sin; dex a METH a sone) is used to treat ear infections. It also stops the swelling and itching caused by the infection. This medicine may be used for other purposes; ask your health care provider or pharmacist if you have questions. COMMON BRAND NAME(S): Ciprodex Otic What should I tell my health care provider before I take this medicine? They need to know if you have any of these conditions:  any other active infection  viral ear infection  an unusual or allergic reaction to ciprofloxacin; dexamethasone, other medicines, foods, dyes, or preservatives  pregnant or trying to get pregnant  breast-feeding How should I use this medicine? This medicine is only for use in the ears. Wash your hands with soap and water. Do not insert any object or swab into the ear canal. Gently warm the bottle by holding it in the hand for 1 to 2 minutes. Shake the bottle immediately before using. Lie down on your side with the affected ear up. Try not to touch the tip of the dropper to your ear, fingertips, or other surface. Squeeze the bottle gently to put the prescribed number of drops in the ear canal. Stay in this position for 30 to 60 seconds to help the drops soak into the ear. Repeat the steps for the other ear if both ears are infected. Do not use your medicine more often than directed. Finish the full course of medicine prescribed by your doctor or health care professional even if you think your condition is better. Talk to your pediatrician regarding the use of this medicine in children. While this drug may be prescribed for children as young as in children 64 months of age and older for selected conditions, precautions do apply. Overdosage: If you think you have taken too much of this medicine contact a poison control center or emergency room at once. NOTE: This medicine is only for you. Do not  share this medicine with others. What if I miss a dose? If you miss a dose, use it as soon as you can. If it is almost time for your next dose, use only that dose. Do not use double or extra doses. What may interact with this medicine? Interactions are not expected. Do not use any other ear products without telling your doctor or health care professional. This list may not describe all possible interactions. Give your health care provider a list of all the medicines, herbs, non-prescription drugs, or dietary supplements you use. Also tell them if you smoke, drink alcohol, or use illegal drugs. Some items may interact with your medicine. What should I watch for while using this medicine? Tell your doctor or health care professional if your ear infection does not get better in a few days. If rash or allergic reaction occurs, stop using immediately and contact your doctor or health care professional. It is important that you keep the infected ear(s) clean and dry. When bathing, try not to get the infected ear(s) wet. Do not go swimming unless your doctor or health care professional has told you otherwise. To prevent the spread of infection, do not share ear products or share towels and washcloths with anyone else. What side effects may I notice from receiving this medicine? Side effects that you should report to your doctor or health care professional as soon as possible:  allergic reactions like skin rash, itching or hives, swelling of the face, lips, or tongue  burning, itching, and redness  worsening ear pain Side effects that usually do not require medical attention (report to your doctor or health care professional if they continue or are bothersome):  abnormal feeling in the ear  headache  unpleasant feeling while putting the drops in the ear This list may not describe all possible side effects. Call your doctor for medical advice about side effects. You may report side effects to FDA at  1-800-FDA-1088. Where should I keep my medicine? Keep out of the reach of children. Store at room temperature between 15 and 30 degrees C (59 and 86 degrees F). Do not freeze. Protect from light. Throw away any unused medicine after the expiration date. NOTE: This sheet is a summary. It may not cover all possible information. If you have questions about this medicine, talk to your doctor, pharmacist, or health care provider.  2021 Elsevier/Gold Standard (2007-09-08 10:33:06) Amoxicillin; Clavulanic Acid Tablets What is this medicine? AMOXICILLIN; CLAVULANIC ACID (a mox i SIL in; KLAV yoo lan ic AS id) is a penicillin antibiotic. It treats some infections caused by bacteria. It will not work for colds, the flu, or other viruses. This medicine may be used for other purposes; ask your health care provider or pharmacist if you have questions. COMMON BRAND NAME(S): Augmentin What should I tell my health care provider before I take this medicine? They need to know if you have any of these conditions:  kidney disease  liver disease  mononucleosis  stomach or intestine problems such as colitis  an unusual or allergic reaction to amoxicillin, other penicillin or cephalosporin antibiotics, clavulanic acid, other medicines, foods, dyes, or preservatives  pregnant or trying to get pregnant  breast-feeding How should I use this medicine? Take this drug by mouth. Take it as directed on the prescription label at the same time every day. Take it with food at the start of a meal or snack. Take all of this drug unless your health care provider tells you to stop it early. Keep taking it even if you think you are better. Talk to your health care provider about the use of this drug in children. While it may be prescribed for selected conditions, precautions do apply. Overdosage: If you think you have taken too much of this medicine contact a poison control center or emergency room at once. NOTE: This  medicine is only for you. Do not share this medicine with others. What if I miss a dose? If you miss a dose, take it as soon as you can. If it is almost time for your next dose, take only that dose. Do not take double or extra doses. What may interact with this medicine?  allopurinol  anticoagulants  birth control pills  methotrexate  probenecid This list may not describe all possible interactions. Give your health care provider a list of all the medicines, herbs, non-prescription drugs, or dietary supplements you use. Also tell them if you smoke, drink alcohol, or use illegal drugs. Some items may interact with your medicine. What should I watch for while using this medicine? Tell your health care provider if your symptoms do not start to get better or if they get worse. This medicine may cause serious skin reactions. They can happen weeks to months after starting the medicine. Contact your health care provider right away if you notice fevers or flu-like symptoms with a rash. The rash may be red or purple and then turn into blisters or peeling of the skin. Or, you  might notice a red rash with swelling of the face, lips or lymph nodes in your neck or under your arms. Do not treat diarrhea with over the counter products. Contact your health care provider if you have diarrhea that lasts more than 2 days or if it is severe and watery. If you have diabetes, you may get a false-positive result for sugar in your urine. Check with your health care provider. Birth control may not work properly while you are taking this medicine. Talk to your health care provider about using an extra method of birth control. What side effects may I notice from receiving this medicine? Side effects that you should report to your doctor or health care provider as soon as possible:  allergic reactions (skin rash, itching or hives; swelling of the face, lips, or tongue)  bloody or watery diarrhea  dark  urine  infection (fever, chills, cough, sore throat, or pain)  kidney injury (trouble passing urine or change in the amount of urine)  redness, blistering, peeling, or loosening of the skin, including inside the mouth  seizures  thrush (white patches in the mouth or mouth sores)  trouble breathing  unusual bruising or bleeding  unusually weak or tired Side effects that usually do not require medical attention (report to your doctor or health care provider if they continue or are bothersome):  diarrhea  dizziness  headache  nausea, vomiting  unusual vaginal discharge, itching, or odor  upset stomach This list may not describe all possible side effects. Call your doctor for medical advice about side effects. You may report side effects to FDA at 1-800-FDA-1088. Where should I keep my medicine? Keep out of the reach of children and pets. Store at room temperature between 20 and 25 degrees C (68 and 77 degrees F). Throw away any unused drug after the expiration date. NOTE: This sheet is a summary. It may not cover all possible information. If you have questions about this medicine, talk to your doctor, pharmacist, or health care provider.  2021 Elsevier/Gold Standard (2020-05-16 09:45:03) Otitis Externa  Otitis externa is an infection of the outer ear canal. The outer ear canal is the area between the outside of the ear and the eardrum. Otitis externa is sometimes called swimmer's ear. What are the causes? Common causes of this condition include:  Swimming in dirty water.  Moisture in the ear.  An injury to the inside of the ear.  An object stuck in the ear.  A cut or scrape on the outside of the ear. What increases the risk? You are more likely to get this condition if you go swimming often. What are the signs or symptoms?  Itching in the ear. This is often the first symptom.  Swelling of the ear.  Redness in the ear.  Ear pain. The pain may get worse when  you pull on your ear.  Pus coming from the ear. How is this treated? This condition may be treated with:  Antibiotic ear drops. These are often given for 10-14 days.  Medicines to reduce itching and swelling. Follow these instructions at home:  If you were given antibiotic ear drops, use them as told by your doctor. Do not stop using them even if your condition gets better.  Take over-the-counter and prescription medicines only as told by your doctor.  Avoid getting water in your ears as told by your doctor. You may be told to avoid swimming or water sports for a few days.  Keep  all follow-up visits as told by your doctor. This is important. How is this prevented?  Keep your ears dry. Use the corner of a towel to dry your ears after you swim or bathe.  Try not to scratch or put things in your ear. Doing these things makes it easier for germs to grow in your ear.  Avoid swimming in lakes, dirty water, or pools that may not have the right amount of a chemical called chlorine. Contact a doctor if:  You have a fever.  Your ear is still red, swollen, or painful after 3 days.  You still have pus coming from your ear after 3 days.  Your redness, swelling, or pain gets worse.  You have a really bad headache.  You have redness, swelling, pain, or tenderness behind your ear. Summary  Otitis externa is an infection of the outer ear canal.  Symptoms include pain, redness, and swelling of the ear.  If you were given antibiotic ear drops, use them as told by your doctor. Do not stop using them even if your condition gets better.  Try not to scratch or put things in your ear. This information is not intended to replace advice given to you by your health care provider. Make sure you discuss any questions you have with your health care provider. Document Revised: 11/27/2017 Document Reviewed: 11/27/2017 Elsevier Patient Education  2021 Elim. Otitis Media, Adult  Otitis  media is a condition in which the middle ear is red and swollen (inflamed) and full of fluid. The middle ear is the part of the ear that contains bones for hearing as well as air that helps send sounds to the brain. The condition usually goes away on its own. What are the causes? This condition is caused by a blockage in the eustachian tube. The eustachian tube connects the middle ear to the back of the nose. It normally allows air into the middle ear. The blockage is caused by fluid or swelling. Problems that can cause blockage include:  A cold or infection that affects the nose, mouth, or throat.  Allergies.  An irritant, such as tobacco smoke.  Adenoids that have become large. The adenoids are soft tissue located in the back of the throat, behind the nose and the roof of the mouth.  Growth or swelling in the upper part of the throat, just behind the nose (nasopharynx).  Damage to the ear caused by change in pressure. This is called barotrauma. What are the signs or symptoms? Symptoms of this condition include:  Ear pain.  Fever.  Problems with hearing.  Being tired.  Fluid leaking from the ear.  Ringing in the ear. How is this treated? This condition can go away on its own within 3-5 days. But if the condition is caused by bacteria or does not go away on its own, or if it keeps coming back, your doctor may:  Give you antibiotic medicines.  Give you medicines for pain. Follow these instructions at home:  Take over-the-counter and prescription medicines only as told by your doctor.  If you were prescribed an antibiotic medicine, take it as told by your doctor. Do not stop taking the antibiotic even if you start to feel better.  Keep all follow-up visits as told by your doctor. This is important. Contact a doctor if:  You have bleeding from your nose.  There is a lump on your neck.  You are not feeling better in 5 days.  You feel worse  instead of better. Get help  right away if:  You have pain that is not helped with medicine.  You have swelling, redness, or pain around your ear.  You get a stiff neck.  You cannot move part of your face (paralysis).  You notice that the bone behind your ear hurts when you touch it.  You get a very bad headache. Summary  Otitis media means that the middle ear is red, swollen, and full of fluid.  This condition usually goes away on its own.  If the problem does not go away, treatment may be needed. You may be given medicines to treat the infection or to treat your pain.  If you were prescribed an antibiotic medicine, take it as told by your doctor. Do not stop taking the antibiotic even if you start to feel better.  Keep all follow-up visits as told by your doctor. This is important. This information is not intended to replace advice given to you by your health care provider. Make sure you discuss any questions you have with your health care provider. Document Revised: 05/26/2019 Document Reviewed: 05/26/2019 Elsevier Patient Education  2021 [employer].

## 2020-09-14 NOTE — Progress Notes (Addendum)
Virtual telephone visit    Virtual Visit via Telephone Note   This visit type was conducted due to national recommendations for restrictions regarding the COVID-19 Pandemic (e.g. social distancing) in an effort to limit this patient's exposure and mitigate transmission in our community. Due to her co-morbid illnesses, this patient is at least at moderate risk for complications without adequate follow up. This format is felt to be most appropriate for this patient at this time. The patient did not have access to video technology or had technical difficulties with video requiring transitioning to audio format only (telephone). Physical exam was limited to content and character of the telephone converstion.   Parties involved in visit as below:   Patient location: at home  Provider location: Provider: Provider's office at  [facility], [place].     I discussed the limitations of evaluation and management by telemedicine and the availability of in person appointments. The patient expressed understanding and agreed to proceed.   Visit Date: 09/14/2020  Today's healthcare provider: [place] Arnecia Ector, FNP    Subjective    Otalgia  There is pain in the left ear. This is a new problem. The current episode started 1 to 4 weeks ago. The problem has been gradually worsening. There has been no fever. The pain is moderate. Associated symptoms include neck pain. Pertinent negatives include no abdominal pain, coughing, diarrhea, ear discharge, headaches, hearing loss, rash, rhinorrhea, sore throat or vomiting. She has tried acetaminophen for the symptoms. The treatment provided moderate relief.    She has been taking Tylenol. Denies any drainage. Hurts when tugging on her ear.   Patient  denies any fever, body aches,chills, rash, chest pain, shortness of breath, nausea, vomiting, or diarrhea.   Denies dizziness, lightheadedness, pre syncopal or syncopal episodes.        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   Asthma 12/21/2008   Allergic rhinitis 04/16/2008   Essential (primary) hypertension 05/03/2007   Mixed hyperlipidemia 07/07/2006   Peptic ulcer 07/08/2003   Colon, diverticulosis 07/07/1998   Past Medical History:  Diagnosis Date   Bladder infection    Diabetes mellitus without complication ([place])    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   ADVAIR DISKUS 250-50 MCG/DOSE AEPB INHALE 1 DOSE BY MOUTH TWICE DAILY   albuterol (PROAIR HFA) 108 (90 Base) MCG/ACT inhaler Inhale 2 puffs into the lungs every 6 (six) hours as needed.   atorvastatin (LIPITOR) 40 MG tablet TAKE 1 TABLET  BY MOUTH ONCE DAILY AT  6PM   ELIQUIS 5 MG TABS tablet Take 5 mg by mouth every 12 (twelve) hours.   fluticasone (FLONASE) 50 MCG/ACT nasal spray Use 2 spray(s) in each nostril once daily   Glucosamine-Chondroit-Vit C-Mn (GLUCOSAMINE CHONDR 1500 COMPLX) CAPS Take 3-4 capsules by mouth daily. Takes 3-4 capsules daily    levocetirizine (XYZAL) 5 MG tablet TAKE 1 TABLET BY MOUTH ONCE DAILY IN THE EVENING   levothyroxine (EUTHYROX) 50 MCG tablet TAKE 1 TABLET BY MOUTH ONCE DAILY BEFORE BREAKFAST   Magnesium 250 MG TABS Take by mouth daily.   metFORMIN (GLUCOPHAGE-XR) 500 MG 24 hr tablet TAKE ONE TABLET BY MOUTH ONCE DAILY FOR  BLOOD  SUGAR   montelukast (SINGULAIR) 10 MG tablet Take 10 mg by mouth at bedtime.   nitroGLYCERIN (NITROSTAT) 0.3 MG SL tablet Place 1 tablet (0.3 mg total) under the tongue every 5 (five) minutes as needed for chest pain.   omeprazole (PRILOSEC) 20 MG capsule Take 20 mg by mouth daily.   ONETOUCH ULTRA test strip USE ONE STRIP TO CHECK GLUCOSE ONCE DAILY   potassium chloride (KLOR-CON) 10 MEQ tablet Take 1 tablet (10 mEq total) by mouth 2 (two) times daily.   PROCTO-MED HC 2.5 % rectal cream APPLY CREAM RECTALLY TO AFFECTED AREA TWICE DAILY   [place] Wort 300 MG CAPS Take 300 mg by mouth in the morning, at noon, and at bedtime.    triamterene-hydrochlorothiazide (MAXZIDE-25) 37.5-25 MG tablet Take 1 tablet by mouth daily.   No facility-administered medications prior to visit.    Review of Systems  HENT: Positive for ear pain. Negative for ear discharge, hearing loss, rhinorrhea and sore throat.   Respiratory: Negative for cough.   Gastrointestinal: Negative for abdominal pain, diarrhea and vomiting.  Musculoskeletal: Positive for neck pain.  Skin: Negative for rash.  Neurological: Negative for headaches.    Last CBC Lab Results  Component Value Date   WBC 6.3 01/23/2020   HGB 13.0 01/23/2020   HCT 39.3 01/23/2020   MCV 97 01/23/2020   MCH 32.1 01/23/2020   RDW 11.8 01/23/2020   PLT 276 55/73/2202   Last metabolic panel Lab Results  Component Value Date   GLUCOSE 102 (H) 01/23/2020   NA 142 01/23/2020   K 3.6 01/23/2020   CL 103 01/23/2020   CO2 26 01/23/2020   BUN 19 01/23/2020   CREATININE 0.90 01/23/2020   GFRNONAA 62 01/23/2020   GFRAA 71  01/23/2020   CALCIUM 9.5 01/23/2020   PHOS 3.6 03/18/2016   PROT 6.7 01/23/2020   ALBUMIN 4.0 01/23/2020   LABGLOB 2.7 01/23/2020   AGRATIO 1.5 01/23/2020   BILITOT 0.5 01/23/2020   ALKPHOS 91 01/23/2020   AST 37 01/23/2020   ALT 23 01/23/2020   ANIONGAP 10 07/19/2018      Objective    There were no vitals taken for this visit. Wt Readings from Last 3 Encounters:  08/01/20 165 lb 3.2 oz (74.9 kg)  05/16/20 157 lb (71.2 kg)  03/23/20 160 lb (72.6 kg)  No vitals.    Patient is alert and oriented and responsive to questions Engages in conversation with provider. Speaks in full sentences without any pauses without any shortness of breath or distress.     Assessment & Plan    Acute otitis media, unspecified otitis media type  Acute otitis externa of left ear, unspecified type  Meds ordered this encounter  Medications   amoxicillin-clavulanate (AUGMENTIN) 875-125 MG tablet  Sig: Take 1 tablet by mouth 2 (two) times daily.    Dispense:  20 tablet    Refill:  0   ciprofloxacin-dexamethasone (CIPRODEX) OTIC suspension    Sig: Place 4 drops into the left ear 2 (two) times daily.    Dispense:  7.5 mL    Refill:  0    Red Flags discussed. The patient was given clear instructions to go to ER or return to medical center if any red flags develop, symptoms do not improve, worsen or new problems develop. They verbalized understanding.   I discussed the limitations of evaluation and management by telemedicine and the availability of in person appointments. The patient expressed understanding and agreed to proceed. Return in about 4 days (around 09/18/2020), or if symptoms worsen or fail to improve, for at any time for any worsening symptoms, Go to Emergency room/ urgent care if worse. I discussed the assessment and treatment plan with the patient. The patient was provided an opportunity to ask questions and all were answered. The patient agreed with the plan and demonstrated an  understanding of the instructions.   The patient was advised to call back or seek an in-person evaluation if the symptoms worsen or if the condition fails to improve as anticipated.  I provided 30  minutes of non-face-to-face time during this encounter.  The entirety of the information documented in the History of Present Illness, Review of Systems and Physical Exam were personally obtained by me. Portions of this information were initially documented by the CMA and reviewed by me for thoroughness and accuracy.  I discussed the limitations of evaluation and management by telemedicine and the availability of in person appointments. The patient expressed understanding and agreed to proceed.  Marcille Buffy, [place] [phone] (phone) [phone] (fax)  [place]

## 2020-09-26 ENCOUNTER — Other Ambulatory Visit: Payer: Self-pay | Admitting: Family Medicine

## 2020-09-26 NOTE — Telephone Encounter (Signed)
   Notes to clinic:  this script has expired  Review for continued use and refill   Requested Prescriptions  Pending Prescriptions Disp Refills   ONETOUCH ULTRA test strip [Pharmacy Med Name: OneTouch Ultra Blue In Vitro Strip] 100 each 0    Sig: USE 1 STRIP TO [place]      Endocrinology: Diabetes - Testing Supplies Passed - 09/26/2020  9:14 AM      Passed - Valid encounter within last 12 months    Recent Outpatient Visits           1 week ago Acute otitis media, unspecified otitis media type   [place], FNP   1 month ago Type 2 diabetes mellitus with vascular disease [facility])   [place], Donald E, MD   6 months ago [place], Donald E, MD   7 months ago [place], Donald E, MD   7 months ago Allergic rhinitis, unspecified seasonality, unspecified trigger   [facility] Trinna Post, PA-C       Future Appointments             In 4 months Fisher, Kirstie Peri, MD [facility], [place]

## 2020-10-09 ENCOUNTER — Encounter: Payer: Self-pay | Admitting: Emergency Medicine

## 2020-10-09 ENCOUNTER — Other Ambulatory Visit: Payer: Self-pay

## 2020-10-09 ENCOUNTER — Ambulatory Visit
Admission: EM | Admit: 2020-10-09 | Discharge: 2020-10-09 | Disposition: A | Discharge status: Home / Self Care | Payer: PPO | Attending: Family Medicine | Admitting: Family Medicine

## 2020-10-09 DIAGNOSIS — M25572 Pain in left ankle and joints of left foot: Secondary | ICD-10-CM | POA: Diagnosis not present

## 2020-10-09 MED ORDER — PREDNISONE 10 MG PO TABS: 20.0000 mg | ORAL_TABLET | Freq: Every day | ORAL | 0 refills | Status: AC

## 2020-10-09 NOTE — Discharge Instructions (Addendum)
I believe this is arthritis I am giving you low dose prednisone to take over the next 4 days. Take daily with food.  No motrin or ibuprofen while taking this. You can take tylenol.  Rest, Ice, Elevate the foot.  Follow up as needed for continued or worsening symptoms

## 2020-10-09 NOTE — ED Provider Notes (Signed)
Roderic Palau    CSN: [id number] Arrival date & time: 10/09/20  1601      History   Chief Complaint Chief Complaint  Patient presents with  . Ankle Pain    HPI ARSHIYA JAKES is a 80 y.o. female.   Patient is a 80 year old female presents today with left ankle pain and swelling.  This started approximately 2 days ago.  Did a lot of walking and going up and down stairs on Saturday.  Felt fine Saturday evening and then woke up Sunday morning with severe pain and swelling in the ankle.  She has been resting, icing the ankle and taking Tylenol and took Motrin today.  Symptoms somewhat improved but still having pain with ambulation.  No specific pain with palpation, increased warmth or redness.  No injuries.   Ankle Pain   Past Medical History:  Diagnosis Date  . Bladder infection   . Diabetes mellitus without complication ([place])   . 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 Diverticulosis   . History of peptic ulcer    perforated  . Hyperlipidemia   . Hyperplastic colon polyp   . Hypertension   . Pneumonia 01/23/2015   [facility]   . Stroke [facility])     Patient Active Problem List   Diagnosis Date Noted  . Acute otitis externa of left ear 09/14/2020  . Acute otitis media 09/14/2020  . Intermittent atrial fibrillation ([place]) 03/29/2018  . Hypothyroidism 01/25/2018  . Hiatal hernia 01/25/2018  . AKI (acute kidney injury) ([place]) 01/20/2018  . Cerebrovascular disease 01/15/2017  . History of CVA (cerebrovascular accident) 01/05/2017  . Hyperplastic colon polyp 12/25/2016  . Occult blood in stools 10/14/2016  . 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 Asthma 12/21/2008  . Allergic rhinitis 04/16/2008  . Essential (primary) hypertension 05/03/2007  . Mixed hyperlipidemia 07/07/2006  . Peptic ulcer 07/08/2003  . Colon, diverticulosis 07/07/1998    Past Surgical History:  Procedure Laterality Date  . ABDOMINAL HYSTERECTOMY  1994   BSO  . CATARACT EXTRACTION      left eye- 09/2014; righr eye- 08/13/2014  . COLONOSCOPY WITH PROPOFOL N/A 12/23/2016   Procedure: COLONOSCOPY WITH PROPOFOL;  Surgeon: Lollie Sails, MD;  Location: [facility] ENDOSCOPY;  Service: Endoscopy;  Laterality: N/A;  . COLONOSCOPY WITH PROPOFOL N/A 05/16/2020   Procedure: COLONOSCOPY WITH PROPOFOL;  Surgeon: Toledo, Benay Pike, MD;  Location: [facility] ENDOSCOPY;  Service: Gastroenterology;  Laterality: N/A;  . ESOPHAGOGASTRODUODENOSCOPY    . ESOPHAGOGASTRODUODENOSCOPY (EGD) WITH PROPOFOL N/A 05/16/2020   Procedure: ESOPHAGOGASTRODUODENOSCOPY (EGD) WITH PROPOFOL;  Surgeon: Toledo, Benay Pike, MD;  Location: [facility] ENDOSCOPY;  Service: Gastroenterology;  Laterality: N/A;  . INSERTION OF CARDIAC MONITOR    . INSERTION OF CARDIAC MONITOR    . LOOP RECORDER INSERTION N/A 07/29/2017   Procedure: LOOP RECORDER INSERTION;  Surgeon: Isaias Cowman, MD;  Location: [facility] CV LAB;  Service: Cardiovascular;  Laterality: N/A;    OB History    Gravida  3   Para  3   Term      Preterm      AB      Living        SAB      IAB      Ectopic      Multiple      Live Births               Home Medications    Prior to Admission medications   Medication  Sig Start Date End Date Taking? Authorizing Provider  acetaminophen (TYLENOL) 500 MG tablet Take 500 mg by mouth every 6 (six) hours as needed.   Yes [provider]  atorvastatin (LIPITOR) 40 MG tablet TAKE 1 TABLET BY MOUTH ONCE DAILY AT  6PM 04/08/20  Yes Fisher, Kirstie Peri, MD  ELIQUIS 5 MG TABS tablet Take 5 mg by mouth every 12 (twelve) hours. 03/10/18  Yes [provider]  Glucosamine-Chondroit-Vit C-Mn (GLUCOSAMINE CHONDR 1500 COMPLX) CAPS Take 3-4 capsules by mouth daily. Takes 3-4 capsules daily   Yes [provider]  levothyroxine (EUTHYROX) 50 MCG tablet TAKE 1 TABLET BY MOUTH ONCE DAILY BEFORE BREAKFAST 01/24/20  Yes Birdie Sons, MD  predniSONE (DELTASONE) 10 MG tablet Take 2 tablets  (20 mg total) by mouth daily for 4 days. 10/09/20 10/13/20 Yes Frenchie Dangerfield A, NP  ADVAIR DISKUS 250-50 MCG/DOSE AEPB INHALE 1 DOSE BY MOUTH TWICE DAILY 03/13/20   Birdie Sons, MD  albuterol (PROAIR HFA) 108 (90 Base) MCG/ACT inhaler Inhale 2 puffs into the lungs every 6 (six) hours as needed. 08/30/18   Birdie Sons, MD  amoxicillin-clavulanate (AUGMENTIN) 875-125 MG tablet Take 1 tablet by mouth 2 (two) times daily. 09/14/20   Flinchum, Kelby Aline, FNP  ciprofloxacin-dexamethasone (CIPRODEX) OTIC suspension Place 4 drops into the left ear 2 (two) times daily. 09/14/20   Flinchum, Kelby Aline, FNP  fluticasone (FLONASE) 50 MCG/ACT nasal spray Use 2 spray(s) in each nostril once daily 02/09/20   Birdie Sons, MD  levocetirizine (XYZAL) 5 MG tablet TAKE 1 TABLET BY MOUTH ONCE DAILY IN THE EVENING 08/16/20   Birdie Sons, MD  Magnesium 250 MG TABS Take by mouth daily.    [provider]  metFORMIN (GLUCOPHAGE-XR) 500 MG 24 hr tablet TAKE ONE TABLET BY MOUTH ONCE DAILY FOR  BLOOD  SUGAR 03/23/20   Birdie Sons, MD  montelukast (SINGULAIR) 10 MG tablet Take 10 mg by mouth at bedtime.    [provider]  nitroGLYCERIN (NITROSTAT) 0.3 MG SL tablet Place 1 tablet (0.3 mg total) under the tongue every 5 (five) minutes as needed for chest pain. 08/30/18   Birdie Sons, MD  omeprazole (PRILOSEC) 20 MG capsule Take 20 mg by mouth daily.    [provider]  [facility] ULTRA test strip USE 1 STRIP TO CHECK GLUCOSE ONCE DAILY 09/26/20   Birdie Sons, MD  potassium chloride (KLOR-CON) 10 MEQ tablet Take 1 tablet (10 mEq total) by mouth 2 (two) times daily. 01/24/20   Birdie Sons, MD  PROCTO-MED [facility] 2.5 % rectal cream APPLY CREAM RECTALLY TO AFFECTED AREA TWICE DAILY 08/14/20   Birdie Sons, MD  [facility] Wort 300 MG CAPS Take 300 mg by mouth in the morning, at noon, and at bedtime.     [provider]  triamterene-hydrochlorothiazide (MAXZIDE-25) 37.5-25 MG tablet  Take 1 tablet by mouth daily. 01/24/20   Birdie Sons, MD    Family History Family History  Problem Relation Age of Onset  . Lung cancer Mother   . Pancreatic cancer Father   . Lung cancer Father   . Hypertension Sister   . Hyperlipidemia Sister   . Breast cancer Neg Hx     Social History Social History   Tobacco Use  . Smoking status: Never Smoker  . Smokeless tobacco: Never Used  Vaping Use  . Vaping Use: Never used  Substance Use Topics  . Alcohol use: Yes  Alcohol/week: 2.0 standard drinks    Types: 2 Glasses of wine per week    Comment: 2 glasses of wine per week   . Drug use: No     Allergies   Dipyridamole and Sulfa antibiotics   Review of Systems Review of Systems   Physical Exam Triage Vital Signs ED Triage Vitals  Enc Vitals Group     BP 10/09/20 1003 (!) 197/91     Pulse Rate 10/09/20 1003 77     Resp 10/09/20 1003 18     Temp 10/09/20 1003 98.2 F (36.8 C)     Temp Source 10/09/20 1003 Oral     SpO2 10/09/20 1003 94 %     Weight --      Height --      Head Circumference --      Peak Flow --      Pain Score 10/09/20 1004 7     Pain Loc --      Pain Edu? --      Excl. in [place]? --    No data found.  Updated Vital Signs BP (!) 179/100   Pulse 77   Temp 98.2 F (36.8 C) (Oral)   Resp 18   SpO2 94%   Visual Acuity Right Eye Distance:   Left Eye Distance:   Bilateral Distance:    Right Eye Near:   Left Eye Near:    Bilateral Near:     Physical Exam Vitals and nursing note reviewed.  Constitutional:      General: She is not in acute distress.    Appearance: Normal appearance. She is not ill-appearing, toxic-appearing or diaphoretic.  HENT:     Head: Normocephalic.     Nose: Nose normal.     Mouth/Throat:     Pharynx: Oropharynx is clear.  Eyes:     Conjunctiva/sclera: Conjunctivae normal.  Pulmonary:     Effort: Pulmonary effort is normal.  Musculoskeletal:        General: Normal range of motion.     Cervical back:  Normal range of motion.     Comments: Mild swelling to left ankle, lateral. Nontender to palpation and normal range of motion. No increased warmth or redness.  2+ pedal pulse.  Skin:    General: Skin is warm and dry.  Neurological:     Mental Status: She is alert.  Psychiatric:        Mood and Affect: Mood normal.      UC Treatments / Results  Labs (all labs ordered are listed, but only abnormal results are displayed) Labs Reviewed - No data to display  EKG   Radiology No results found.  Procedures Procedures (including critical care time)  Medications Ordered in UC Medications - No data to display  Initial Impression / Assessment and Plan / UC Course  I have reviewed the triage vital signs and the nursing notes.  Pertinent labs & imaging results that were available during my care of the patient were reviewed by me and considered in my medical decision making (see chart for details).     Ankle pain most likely due to inflammation from arthritis.  Will treat with low-dose prednisone over the next 4 days. Continue to rest, ice, elevate. Follow up as needed for continued or worsening symptoms  Final Clinical Impressions(s) / UC Diagnoses   Final diagnoses:  Pain of joint of left ankle and foot     Discharge Instructions     I believe this is  arthritis I am giving you low dose prednisone to take over the next 4 days. Take daily with food.  No motrin or ibuprofen while taking this. You can take tylenol.  Rest, Ice, Elevate the foot.  Follow up as needed for continued or worsening symptoms    ED Prescriptions    Medication Sig Dispense Auth. Provider   predniSONE (DELTASONE) 10 MG tablet Take 2 tablets (20 mg total) by mouth daily for 4 days. 8 tablet Loura Halt A, NP     PDMP not reviewed this encounter.   Orvan July, NP 10/09/20 1028

## 2020-10-09 NOTE — ED Triage Notes (Signed)
Patient c/o LFT ankle pain x 2 days.   Patient endorses "waking up and down some stairs on Saturday".   Patient denies fall or trauma.   Patient has swelling present to ankle.   Patient endorses pain upon ambulation.   Patient has taken Tylenol and used ice/compress with no relief of symptoms.

## 2020-10-11 ENCOUNTER — Ambulatory Visit: Payer: PPO | Admitting: Adult Health

## 2020-10-12 DIAGNOSIS — M7672 Peroneal tendinitis, left leg: Secondary | ICD-10-CM | POA: Diagnosis not present

## 2020-10-12 DIAGNOSIS — M25372 Other instability, left ankle: Secondary | ICD-10-CM | POA: Diagnosis not present

## 2020-10-12 DIAGNOSIS — M216X2 Other acquired deformities of left foot: Secondary | ICD-10-CM | POA: Diagnosis not present

## 2020-10-12 DIAGNOSIS — E119 Type 2 diabetes mellitus without complications: Secondary | ICD-10-CM | POA: Diagnosis not present

## 2020-10-12 DIAGNOSIS — M19072 Primary osteoarthritis, left ankle and foot: Secondary | ICD-10-CM | POA: Diagnosis not present

## 2020-10-12 DIAGNOSIS — M216X1 Other acquired deformities of right foot: Secondary | ICD-10-CM | POA: Diagnosis not present

## 2020-10-12 DIAGNOSIS — M25572 Pain in left ankle and joints of left foot: Secondary | ICD-10-CM | POA: Diagnosis not present

## 2020-10-15 DIAGNOSIS — M7672 Peroneal tendinitis, left leg: Secondary | ICD-10-CM | POA: Diagnosis not present

## 2020-10-15 DIAGNOSIS — M25372 Other instability, left ankle: Secondary | ICD-10-CM | POA: Diagnosis not present

## 2020-10-24 ENCOUNTER — Other Ambulatory Visit: Payer: Self-pay

## 2020-10-24 ENCOUNTER — Ambulatory Visit (INDEPENDENT_AMBULATORY_CARE_PROVIDER_SITE_OTHER): Payer: PPO | Admitting: Family Medicine

## 2020-10-24 VITALS — BP 151/68 | HR 74 | Ht 62.0 in | Wt 168.8 lb

## 2020-10-24 DIAGNOSIS — I10 Essential (primary) hypertension: Secondary | ICD-10-CM | POA: Diagnosis not present

## 2020-10-24 DIAGNOSIS — S96912A Strain of unspecified muscle and tendon at ankle and foot level, left foot, initial encounter: Secondary | ICD-10-CM

## 2020-10-24 MED ORDER — TRIAMTERENE-HCTZ 75-50 MG PO TABS: 1.0000 | ORAL_TABLET | Freq: Every day | ORAL | 3 refills | Status: DC

## 2020-10-24 NOTE — Patient Instructions (Addendum)
.   We're going to double your dose of triamterene-hctz. You can take two a day of the 37.5/25mg  dose until they are gone, then start taking 1 of the 75/50mg  dose every day   You can try a topical anti-inflammatory medication such as OTC Voltaren to help with the ankle pain.

## 2020-10-24 NOTE — Progress Notes (Signed)
Established patient visit   Patient: Destiny Gilbert   DOB: 05-17-1941   80 y.o. Female  MRN: [id number] Visit Date: 10/24/2020  Today's healthcare provider: Lelon Huh, MD   No chief complaint on file.  Subjective    HPI  Hypertension, follow-up  BP Readings from Last 3 Encounters:  10/09/20 (!) 179/100  08/01/20 (!) 142/80  05/16/20 (!) 151/72   Wt Readings from Last 3 Encounters:  08/01/20 165 lb 3.2 oz (74.9 kg)  05/16/20 157 lb (71.2 kg)  03/23/20 160 lb (72.6 kg)     She was last seen for hypertension 3 months ago.  BP at that visit was 142/80. Management since that visit includes none; Continue current medications.  She reports excellent compliance with treatment. She is not having side effects.  She is following a lots of fast food recently diet. She is not exercising. She does not smoke.  Use of agents associated with hypertension: none.   Outside blood pressures are 150s/70s on avg.  Pertinent labs: Lab Results  Component Value Date   CHOL 165 01/23/2020   HDL 58 01/23/2020   LDLCALC 87 01/23/2020   TRIG 113 01/23/2020   CHOLHDL 2.8 01/23/2020   Lab Results  Component Value Date   NA 142 01/23/2020   K 3.6 01/23/2020   CREATININE 0.90 01/23/2020   GFRNONAA 62 01/23/2020   GFRAA 71 01/23/2020   GLUCOSE 102 (H) 01/23/2020     The ASCVD Risk score (Goff DC Jr., et al., 2013) failed to calculate for the following reasons:   The patient has a prior MI or stroke diagnosis   ---------------------------------------------------------------------------------------------------  She also reports spraining her left ankle at the beginning of the month seen at [facility], wearing an ankle brace. Is slowly improving. Is alternating tylenol and OTC ibuprofen.      Medications: Outpatient Medications Prior to Visit  Medication Sig  . acetaminophen (TYLENOL) 500 MG tablet Take 500 mg by mouth every 6 (six) hours as needed.  Marland Kitchen ADVAIR DISKUS 250-50  MCG/DOSE AEPB INHALE 1 DOSE BY MOUTH TWICE DAILY  . albuterol (PROAIR HFA) 108 (90 Base) MCG/ACT inhaler Inhale 2 puffs into the lungs every 6 (six) hours as needed.  Marland Kitchen amoxicillin-clavulanate (AUGMENTIN) 875-125 MG tablet Take 1 tablet by mouth 2 (two) times daily.  Marland Kitchen atorvastatin (LIPITOR) 40 MG tablet TAKE 1 TABLET BY MOUTH ONCE DAILY AT  6PM  . ciprofloxacin-dexamethasone (CIPRODEX) OTIC suspension Place 4 drops into the left ear 2 (two) times daily.  Marland Kitchen ELIQUIS 5 MG TABS tablet Take 5 mg by mouth every 12 (twelve) hours.  . fluticasone (FLONASE) 50 MCG/ACT nasal spray Use 2 spray(s) in each nostril once daily  . Glucosamine-Chondroit-Vit C-Mn (GLUCOSAMINE CHONDR 1500 COMPLX) CAPS Take 3-4 capsules by mouth daily. Takes 3-4 capsules daily  . levocetirizine (XYZAL) 5 MG tablet TAKE 1 TABLET BY MOUTH ONCE DAILY IN THE EVENING  . levothyroxine (EUTHYROX) 50 MCG tablet TAKE 1 TABLET BY MOUTH ONCE DAILY BEFORE BREAKFAST  . Magnesium 250 MG TABS Take by mouth daily.  . metFORMIN (GLUCOPHAGE-XR) 500 MG 24 hr tablet TAKE ONE TABLET BY MOUTH ONCE DAILY FOR  BLOOD  SUGAR  . montelukast (SINGULAIR) 10 MG tablet Take 10 mg by mouth at bedtime.  . nitroGLYCERIN (NITROSTAT) 0.3 MG SL tablet Place 1 tablet (0.3 mg total) under the tongue every 5 (five) minutes as needed for chest pain.  Marland Kitchen omeprazole (PRILOSEC) 20 MG capsule Take 20 mg by  mouth daily.  Glory Rosebush ULTRA test strip USE 1 STRIP TO CHECK GLUCOSE ONCE DAILY  . potassium chloride (KLOR-CON) 10 MEQ tablet Take 1 tablet (10 mEq total) by mouth 2 (two) times daily.  Marland Kitchen PROCTO-MED HC 2.5 % rectal cream APPLY CREAM RECTALLY TO AFFECTED AREA TWICE DAILY  . St Johns Wort 300 MG CAPS Take 300 mg by mouth in the morning, at noon, and at bedtime.   . triamterene-hydrochlorothiazide (MAXZIDE-25) 37.5-25 MG tablet Take 1 tablet by mouth daily.   No facility-administered medications prior to visit.        Objective    BP (!) 151/68 (BP Location: Right  Arm, Patient Position: Sitting, Cuff Size: Normal)   Pulse 74   Ht 5\' 2"  (1.575 m)   Wt 168 lb 12.8 oz (76.6 kg)   SpO2 98%   BMI 30.87 kg/m     Physical Exam   General appearance: Mildly obese female, cooperative and in no acute distress Head: Normocephalic, without obvious abnormality, atraumatic Respiratory: Respirations even and unlabored, normal respiratory rate Extremities: All extremities are intact.  Skin: Skin color, texture, turgor normal. No rashes seen  Psych: Appropriate mood and affect. Neurologic: Mental status: Alert, oriented to person, place, and time, thought content appropriate.     Assessment & Plan     1. Essential (primary) hypertension Home and office consistently over 140. Will double triamterene-hctz dose. She can take two a day of current medication until out then start one a day of the 75/50mg  dose.   2. Strain of left ankle, initial encounter Is slowly improved. Counseled on increased bleeding risk of taking ibuprofen and Eliquis. Tylenol shouldn't be a problem. Could also try OTC diclofenac gel.       The entirety of the information documented in the History of Present Illness, Review of Systems and Physical Exam were personally obtained by me. Portions of this information were initially documented by the CMA and reviewed by me for thoroughness and accuracy.      Lelon Huh, MD  [facility] [phone] (phone) [phone] (fax)  [place]

## 2020-11-02 DIAGNOSIS — M25572 Pain in left ankle and joints of left foot: Secondary | ICD-10-CM | POA: Diagnosis not present

## 2020-11-02 DIAGNOSIS — M19072 Primary osteoarthritis, left ankle and foot: Secondary | ICD-10-CM | POA: Diagnosis not present

## 2020-11-02 DIAGNOSIS — M7672 Peroneal tendinitis, left leg: Secondary | ICD-10-CM | POA: Diagnosis not present

## 2020-11-02 DIAGNOSIS — M25372 Other instability, left ankle: Secondary | ICD-10-CM | POA: Diagnosis not present

## 2020-11-02 DIAGNOSIS — M216X2 Other acquired deformities of left foot: Secondary | ICD-10-CM | POA: Diagnosis not present

## 2020-11-02 DIAGNOSIS — M216X1 Other acquired deformities of right foot: Secondary | ICD-10-CM | POA: Diagnosis not present

## 2020-11-02 DIAGNOSIS — E119 Type 2 diabetes mellitus without complications: Secondary | ICD-10-CM | POA: Diagnosis not present

## 2020-11-13 ENCOUNTER — Other Ambulatory Visit: Payer: Self-pay | Admitting: Family Medicine

## 2020-11-13 ENCOUNTER — Telehealth: Payer: Self-pay | Admitting: Family Medicine

## 2020-11-13 DIAGNOSIS — I10 Essential (primary) hypertension: Secondary | ICD-10-CM

## 2020-11-13 NOTE — Telephone Encounter (Signed)
Maxzide does was increased last month. Please advise it time to recheck electrolytes and kidney functions since increasing dose. Have printed lab order for her, please leave order at Lab.

## 2020-11-13 NOTE — Progress Notes (Signed)
Check renal since increasing maxzide.

## 2020-11-13 NOTE — Telephone Encounter (Signed)
I called patient and patient verbalized understanding of information below.   Patient is planning to come tomorrow.

## 2020-11-14 ENCOUNTER — Telehealth: Payer: Self-pay

## 2020-11-14 DIAGNOSIS — I10 Essential (primary) hypertension: Secondary | ICD-10-CM | POA: Diagnosis not present

## 2020-11-14 NOTE — Telephone Encounter (Signed)
Patient came by office to drop of her shot records for her chart. I am sending that info back.    Her BP has went up again and its running in the 150's over 80s and wanted to see you for this.   However, there are no appts available.

## 2020-11-15 ENCOUNTER — Telehealth: Payer: Self-pay | Admitting: Family Medicine

## 2020-11-15 DIAGNOSIS — I10 Essential (primary) hypertension: Secondary | ICD-10-CM

## 2020-11-15 LAB — RENAL FUNCTION PANEL
Albumin: 4.1 g/dL (ref 3.7–4.7)
BUN/Creatinine Ratio: 18 (ref 12–28)
BUN: 22 mg/dL (ref 8–27)
CO2: 23 mmol/L (ref 20–29)
Calcium: 9.8 mg/dL (ref 8.7–10.3)
Chloride: 104 mmol/L (ref 96–106)
Creatinine, Ser: 1.23 mg/dL — ABNORMAL HIGH (ref 0.57–1.00)
Glucose: 108 mg/dL — ABNORMAL HIGH (ref 65–99)
Phosphorus: 3.8 mg/dL (ref 3.0–4.3)
Potassium: 4.4 mmol/L (ref 3.5–5.2)
Sodium: 143 mmol/L (ref 134–144)
eGFR: 45 mL/min/{1.73_m2} — ABNORMAL LOW (ref >59)

## 2020-11-15 NOTE — Telephone Encounter (Signed)
Medication Refill - Medication: Amlodipine (New Rx)   Pt is upset that she has contacted the pharmacy and her Rx is not available, she says she was promised that it was going to be called in today. She is leaving tomorrow  Has the patient contacted their pharmacy? Yes.   (Agent: If no, request that the patient contact the pharmacy for the refill.) (Agent: If yes, when and what did the pharmacy advise?)  Preferred Pharmacy (with phone number or street name):  [facility] [address] [place]  [place] [address]  Phone: [phone] Fax: [phone]     Agent: Please be advised that RX refills may take up to 3 business days. We ask that you follow-up with your pharmacy.

## 2020-11-15 NOTE — Telephone Encounter (Signed)
Patient called regarding requested amlodipine medication. No order noted for new medication at this time and patient reports she was called today and told her medication would be sent to [place] for pick up today since she will be going out of town. Patient and her husband very upset medication is not a pharmacy for pick up. attempted to review if symptoms worsen or B/P elevates call back or go to UC .encourged patient she could contact office in am , of a pharmacy where she will be located and if PCP writes for new medication, amlodipine; the medication request could be sent to a pharmacy in her location. On call physician Dr. Rosanna Randy notified of patient request.

## 2020-11-16 MED ORDER — AMLODIPINE BESYLATE 2.5 MG PO TABS: 2.5000 mg | ORAL_TABLET | Freq: Every day | ORAL | 1 refills | Status: DC

## 2020-11-16 NOTE — Telephone Encounter (Signed)
She is not prescribed amlodipine. Did she mean to request a refill of another medication?

## 2020-11-16 NOTE — Telephone Encounter (Signed)
Advised patient

## 2020-11-16 NOTE — Telephone Encounter (Signed)
Patient reports that it was recommended by you to start amlodipine if BP continues to run high. She reports that her BP has been 150s/80s. Please see lab note from 11/14/20. She would like the medication sent into the pharmacy listed below as she will be in [place] for the weekend. Thanks!

## 2020-11-16 NOTE — Telephone Encounter (Signed)
Prescription has been sent to [facility] in [place], [place]

## 2020-11-16 NOTE — Telephone Encounter (Signed)
Pt is calling and going out of town today and would like rx to be sent to [facility] [address] , [place] phone number [phone]

## 2020-11-16 NOTE — Telephone Encounter (Signed)
Patient is calling again  regarding a new medication that she said the doctor was supposed to call in for her BP. Checked her list of meds and the Amlodipine was not one of her medications.  Patient is upset because she said the doctor told her he was putting her on this medication at her last visit.  Please call patient to explain at [phone]

## 2020-11-20 ENCOUNTER — Other Ambulatory Visit: Payer: Self-pay | Admitting: Family Medicine

## 2020-11-20 DIAGNOSIS — J309 Allergic rhinitis, unspecified: Secondary | ICD-10-CM

## 2020-11-20 DIAGNOSIS — J45909 Unspecified asthma, uncomplicated: Secondary | ICD-10-CM

## 2020-11-28 DIAGNOSIS — I639 Cerebral infarction, unspecified: Secondary | ICD-10-CM | POA: Diagnosis not present

## 2020-11-28 DIAGNOSIS — I10 Essential (primary) hypertension: Secondary | ICD-10-CM | POA: Diagnosis not present

## 2020-11-28 DIAGNOSIS — Z95818 Presence of other cardiac implants and grafts: Secondary | ICD-10-CM | POA: Diagnosis not present

## 2020-11-28 DIAGNOSIS — I480 Paroxysmal atrial fibrillation: Secondary | ICD-10-CM | POA: Diagnosis not present

## 2020-11-28 DIAGNOSIS — E7800 Pure hypercholesterolemia, unspecified: Secondary | ICD-10-CM | POA: Diagnosis not present

## 2020-12-04 DIAGNOSIS — I639 Cerebral infarction, unspecified: Secondary | ICD-10-CM | POA: Diagnosis not present

## 2020-12-27 ENCOUNTER — Other Ambulatory Visit: Payer: Self-pay | Admitting: Family Medicine

## 2020-12-27 DIAGNOSIS — E119 Type 2 diabetes mellitus without complications: Secondary | ICD-10-CM

## 2021-01-11 ENCOUNTER — Ambulatory Visit: Payer: Self-pay | Admitting: Family Medicine

## 2021-01-14 ENCOUNTER — Telehealth: Payer: Self-pay

## 2021-01-14 NOTE — Chronic Care Management (AMB) (Signed)
    Chronic Care Management Pharmacy Assistant   Name: MARKESHIA GIEBEL  MRN: [id number] DOB: [birth year]  Reason for Encounter: Patient Assistance Coordination   01/14/2021- Patient assistance application for Eliquis with [employer] patient assistance program, application mailed to patient.   Medications: Outpatient Encounter Medications as of 01/14/2021  Medication Sig   acetaminophen (TYLENOL) 500 MG tablet Take 500 mg by mouth every 6 (six) hours as needed.   ADVAIR DISKUS 250-50 MCG/DOSE AEPB INHALE 1 DOSE BY MOUTH TWICE DAILY   albuterol (PROAIR HFA) 108 (90 Base) MCG/ACT inhaler Inhale 2 puffs into the lungs every 6 (six) hours as needed.   amLODipine (NORVASC) 2.5 MG tablet Take 1 tablet (2.5 mg total) by mouth daily.   atorvastatin (LIPITOR) 40 MG tablet TAKE 1 TABLET BY MOUTH ONCE DAILY AT  6PM   ciprofloxacin-dexamethasone (CIPRODEX) OTIC suspension Place 4 drops into the left ear 2 (two) times daily.   ELIQUIS 5 MG TABS tablet Take 5 mg by mouth every 12 (twelve) hours.   fluticasone (FLONASE) 50 MCG/ACT nasal spray Use 2 spray(s) in each nostril once daily (Patient not taking: Reported on 10/24/2020)   Glucosamine-Chondroit-Vit C-Mn (GLUCOSAMINE CHONDR 1500 COMPLX) CAPS Take 3-4 capsules by mouth daily. Takes 3-4 capsules daily   levocetirizine (XYZAL) 5 MG tablet TAKE 1 TABLET BY MOUTH ONCE DAILY IN THE EVENING   levothyroxine (EUTHYROX) 50 MCG tablet TAKE 1 TABLET BY MOUTH ONCE DAILY BEFORE BREAKFAST   Magnesium 250 MG TABS Take by mouth daily. (Patient not taking: Reported on 10/24/2020)   metFORMIN (GLUCOPHAGE-XR) 500 MG 24 hr tablet TAKE 1 TABLET BY MOUTH ONCE DAILY FOR BLOOD SUGAR   montelukast (SINGULAIR) 10 MG tablet Take 10 mg by mouth at bedtime.   nitroGLYCERIN (NITROSTAT) 0.3 MG SL tablet Place 1 tablet (0.3 mg total) under the tongue every 5 (five) minutes as needed for chest pain.   omeprazole (PRILOSEC) 20 MG capsule Take 20 mg by mouth daily. (Patient not  taking: Reported on 10/24/2020)   ONETOUCH ULTRA test strip USE 1 STRIP TO CHECK GLUCOSE ONCE DAILY   potassium chloride (KLOR-CON) 10 MEQ tablet Take 1 tablet (10 mEq total) by mouth 2 (two) times daily.   PROCTO-MED HC 2.5 % rectal cream APPLY CREAM RECTALLY TO AFFECTED AREA TWICE DAILY   St Johns Wort 300 MG CAPS Take 300 mg by mouth in the morning, at noon, and at bedtime.    triamterene-hydrochlorothiazide (MAXZIDE) 75-50 MG tablet Take 1 tablet by mouth daily.   No facility-administered encounter medications on file as of 01/14/2021.    Care Gaps: TETANUS/TDAP FOOT EXAM -Last completed: Jan 10, 2019    COVID-19 Vaccine (4 - Booster for [employer] series) OPHTHALMOLOGY EXAM Ammie Dalton completed: Aug 24, 2019 URINE MICROALBUMIN Ammie Dalton completed: Sep 21, 2019 HEMOGLOBIN A1C (Every 6 Months)- Last completed: Aug 01, 2020  Star Rating Drugs: Metformin 500 mg- Last filled 01/03/2021 for 90 day supply at [facility]. Atorvastatin 40 mg - Last filled 01/03/2021 for 90 day supply at [facility]: Pattricia Boss, [place] Pharmacist Assistant [phone]

## 2021-01-18 ENCOUNTER — Ambulatory Visit: Payer: Self-pay

## 2021-01-18 NOTE — Telephone Encounter (Signed)
Pt. Reports she has had a rash under both breasts x 1 month. Skin is reddish-purple in color. No raised rash noted. Has tried cornstarch, diaper rash cream and antibiotic ointment without relief. Has appointment with PCP 01/28/21, but does not want to wait "that long to be seen." No availability. Please advise pt.

## 2021-01-18 NOTE — Telephone Encounter (Signed)
Rash under both breast/ reddish purple in color / pt states she has had it about a month now and it is not getting any better / please advise     Reason for Disposition  Localized rash present > 7 days  Answer Assessment - Initial Assessment Questions 1. APPEARANCE of RASH: "Describe the rash."      Skin reddish-purple 2. LOCATION: "Where is the rash located?"      Under both breast 3. NUMBER: "How many spots are there?"      N/a 4. SIZE: "How big are the spots?" (Inches, centimeters or compare to size of a coin)      N/a 5. ONSET: "When did the rash start?"      1 month ago 6. ITCHING: "Does the rash itch?" If Yes, ask: "How bad is the itch?"  (Scale 0-10; or none, mild, moderate, severe)     No 7. PAIN: "Does the rash hurt?" If Yes, ask: "How bad is the pain?"  (Scale 0-10; or none, mild, moderate, severe)    - NONE (0): no pain    - MILD (1-3): doesn't interfere with normal activities     - MODERATE (4-7): interferes with normal activities or awakens from sleep     - SEVERE (8-10): excruciating pain, unable to do any normal activities     No 8. OTHER SYMPTOMS: "Do you have any other symptoms?" (e.g., fever)     No 9. PREGNANCY: "Is there any chance you are pregnant?" "When was your last menstrual period?"     No  Protocols used: Rash or Redness - Localized-A-AH

## 2021-01-18 NOTE — Telephone Encounter (Signed)
Apt with Destiny Gilbert 01/21/2021

## 2021-01-18 NOTE — Telephone Encounter (Signed)
Copied from [place] [id number]. Topic: Appointment Scheduling - Scheduling Inquiry for Clinic >> Jan 18, 2021  9:13 AM Alanda Slim E wrote: Reason for CRM: Pt has a rash under her breast and would like to get an appt asap/ please advise

## 2021-01-21 ENCOUNTER — Telehealth: Payer: Self-pay

## 2021-01-21 ENCOUNTER — Ambulatory Visit: Payer: PPO | Admitting: Family Medicine

## 2021-01-21 NOTE — Telephone Encounter (Signed)
Copied from [place] [phone]. Topic: Appointment Scheduling - Scheduling Inquiry for Clinic >> Jan 21, 2021 10:19 AM Alanda Slim E wrote: Reason for CRM: Pt was scheduled for today and was called to reschedule due to provider out/ pt was upset and stated she didn't want to reschedule and stated she would take care of her self / please advise

## 2021-01-28 ENCOUNTER — Encounter: Payer: Self-pay | Admitting: Family Medicine

## 2021-02-04 ENCOUNTER — Ambulatory Visit (INDEPENDENT_AMBULATORY_CARE_PROVIDER_SITE_OTHER): Payer: PPO | Admitting: Family Medicine

## 2021-02-04 ENCOUNTER — Encounter: Payer: Self-pay | Admitting: Family Medicine

## 2021-02-04 ENCOUNTER — Other Ambulatory Visit: Payer: Self-pay

## 2021-02-04 VITALS — BP 112/70 | HR 79 | Temp 98.2°F | Resp 18 | Wt 165.0 lb

## 2021-02-04 DIAGNOSIS — R82998 Other abnormal findings in urine: Secondary | ICD-10-CM | POA: Diagnosis not present

## 2021-02-04 DIAGNOSIS — I10 Essential (primary) hypertension: Secondary | ICD-10-CM | POA: Diagnosis not present

## 2021-02-04 DIAGNOSIS — E1159 Type 2 diabetes mellitus with other circulatory complications: Secondary | ICD-10-CM | POA: Diagnosis not present

## 2021-02-04 DIAGNOSIS — N390 Urinary tract infection, site not specified: Secondary | ICD-10-CM | POA: Diagnosis not present

## 2021-02-04 DIAGNOSIS — E039 Hypothyroidism, unspecified: Secondary | ICD-10-CM

## 2021-02-04 LAB — POCT URINALYSIS DIPSTICK
Bilirubin, UA: NEGATIVE
Blood, UA: NEGATIVE
Glucose, UA: NEGATIVE
Ketones, UA: NEGATIVE
Nitrite, UA: NEGATIVE
Protein, UA: NEGATIVE
Spec Grav, UA: <=1.005 — AB (ref 1.010–1.025)
Urobilinogen, UA: 0.2 E.U./dL
pH, UA: 7.5 (ref 5.0–8.0)

## 2021-02-04 LAB — POCT GLYCOSYLATED HEMOGLOBIN (HGB A1C)
Est. average glucose Bld gHb Est-mCnc: 128
Hemoglobin A1C: 6.1 % — AB (ref 4.0–5.6)

## 2021-02-04 MED ORDER — CEPHALEXIN 500 MG PO CAPS
500.0000 mg | ORAL_CAPSULE | Freq: Two times a day (BID) | ORAL | 0 refills | Status: AC
Start: 2021-02-04 — End: 2021-02-09

## 2021-02-04 NOTE — Progress Notes (Signed)
I,[name],acting as a scribe for Lelon Huh, MD.,have documented all relevant documentation on the behalf of Lelon Huh, MD,as directed by  Lelon Huh, MD while in the presence of Lelon Huh, MD.  Established patient visit   Patient: Destiny Gilbert   DOB: [birth year]   80 y.o. Female  MRN: [id number] Visit Date: 02/04/2021  Today's healthcare provider: Lelon Huh, MD   Chief Complaint  Patient presents with   Follow-up   Diabetes   Subjective    HPI  Diabetes Mellitus Type II, follow-up  Lab Results  Component Value Date   HGBA1C 6.1 (A) 02/04/2021   HGBA1C 6.5 (A) 08/01/2020   HGBA1C 6.3 (H) 01/23/2020   Last seen for diabetes 6 months ago.  Management since then includes continuing the same treatment. She reports good compliance with treatment. She is not having side effects. none  Home blood sugar records: Usually in the low 100s, but for the last 10 days has been running in the upper 100s and low 200s when she checks in the evening.   Episodes of hypoglycemia? No none   Current insulin regiment: n/a Most Recent Eye Exam: 08/24/2019--Due  Dis start taking Mucous relief PE a few months ago. Otherwise feels well. Has noticed that urine has been a little darker than usual the last several days.  ---------------------------------------------------------------------------------------------------    Medications: Outpatient Medications Prior to Visit  Medication Sig   acetaminophen (TYLENOL) 500 MG tablet Take 500 mg by mouth every 6 (six) hours as needed.   ADVAIR DISKUS 250-50 MCG/DOSE AEPB INHALE 1 DOSE BY MOUTH TWICE DAILY   albuterol (PROAIR HFA) 108 (90 Base) MCG/ACT inhaler Inhale 2 puffs into the lungs every 6 (six) hours as needed.   amLODipine (NORVASC) 2.5 MG tablet Take 1 tablet (2.5 mg total) by mouth daily.   atorvastatin (LIPITOR) 40 MG tablet TAKE 1 TABLET BY MOUTH ONCE DAILY AT  6PM   ELIQUIS 5 MG TABS tablet Take 5 mg by mouth  every 12 (twelve) hours.   guaiFENesin (MUCUS RELIEF PO) Take by mouth.   levocetirizine (XYZAL) 5 MG tablet TAKE 1 TABLET BY MOUTH ONCE DAILY IN THE EVENING   levothyroxine (EUTHYROX) 50 MCG tablet TAKE 1 TABLET BY MOUTH ONCE DAILY BEFORE BREAKFAST   metFORMIN (GLUCOPHAGE-XR) 500 MG 24 hr tablet TAKE 1 TABLET BY MOUTH ONCE DAILY FOR BLOOD SUGAR   nitroGLYCERIN (NITROSTAT) 0.3 MG SL tablet Place 1 tablet (0.3 mg total) under the tongue every 5 (five) minutes as needed for chest pain.   ONETOUCH ULTRA test strip USE 1 STRIP TO CHECK GLUCOSE ONCE DAILY   potassium chloride (KLOR-CON) 10 MEQ tablet Take 1 tablet (10 mEq total) by mouth 2 (two) times daily.   PROCTO-MED HC 2.5 % rectal cream APPLY CREAM RECTALLY TO AFFECTED AREA TWICE DAILY   triamterene-hydrochlorothiazide (MAXZIDE) 75-50 MG tablet Take 1 tablet by mouth daily.   omeprazole (PRILOSEC) 20 MG capsule Take 20 mg by mouth daily. (Patient not taking: No sig reported)   [DISCONTINUED] ciprofloxacin-dexamethasone (CIPRODEX) OTIC suspension Place 4 drops into the left ear 2 (two) times daily. (Patient not taking: Reported on 02/04/2021)   [DISCONTINUED] fluticasone (FLONASE) 50 MCG/ACT nasal spray Use 2 spray(s) in each nostril once daily (Patient not taking: No sig reported)   [DISCONTINUED] Glucosamine-Chondroit-Vit C-Mn (GLUCOSAMINE CHONDR 1500 COMPLX) CAPS Take 3-4 capsules by mouth daily. Takes 3-4 capsules daily (Patient not taking: Reported on 02/04/2021)   [DISCONTINUED] Magnesium 250 MG TABS Take by  mouth daily. (Patient not taking: No sig reported)   [DISCONTINUED] montelukast (SINGULAIR) 10 MG tablet Take 10 mg by mouth at bedtime. (Patient not taking: Reported on 02/04/2021)   [DISCONTINUED] St Johns Wort 300 MG CAPS Take 300 mg by mouth in the morning, at noon, and at bedtime.  (Patient not taking: Reported on 02/04/2021)   No facility-administered medications prior to visit.    Review of Systems  Constitutional:  Negative for  appetite change, chills, fatigue and fever.  Respiratory:  Negative for chest tightness and shortness of breath.   Cardiovascular:  Negative for chest pain and palpitations.  Gastrointestinal:  Negative for abdominal pain, nausea and vomiting.  Neurological:  Negative for dizziness and weakness.     Objective    BP 112/70 (BP Location: Left Arm, Patient Position: Sitting, Cuff Size: Large)   Pulse 79   Temp 98.2 F (36.8 C) (Temporal)   Resp 18   Wt 165 lb (74.8 kg)   SpO2 94%   BMI 30.18 kg/m     Physical Exam    General: Appearance:    Mildly obese female in no acute distress  Eyes:    PERRL, conjunctiva/corneas clear, EOM's intact       Lungs:     Clear to auscultation bilaterally, respirations unlabored  Heart:    Normal heart rate.  No murmurs, rubs, or gallops.    MS:   All extremities are intact.    Neurologic:   Awake, alert, oriented x 3. No apparent focal neurological defect.         Results for orders placed or performed in visit on 02/04/21  POCT glycosylated hemoglobin (Hb A1C)  Result Value Ref Range   Hemoglobin A1C 6.1 (A) 4.0 - 5.6 %   Est. 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 U/a is concerning for UTI. Will treat as below and check labs.   2. Essential (primary) hypertension Well controlled.  Continue current medications.   - CBC - Comprehensive metabolic panel  3. Hypothyroidism, unspecified  type  - TSH - T4, free  4. Dark urine   5. Urinary tract infection without hematuria, site unspecified  - CULTURE, URINE COMPREHENSIVE       The entirety of the information documented in the History of Present Illness, Review of Systems and Physical Exam were personally obtained by me. Portions of this information were initially documented by the CMA and reviewed by me for thoroughness and accuracy.     Lelon Huh, MD  [facility] [phone] (phone) [phone] (fax)  [place]

## 2021-02-05 LAB — CBC
Hematocrit: 37.5 % (ref 34.0–46.6)
Hemoglobin: 12.2 g/dL (ref 11.1–15.9)
MCH: 31.7 pg (ref 26.6–33.0)
MCHC: 32.5 g/dL (ref 31.5–35.7)
MCV: 97 fL (ref 79–97)
Platelets: 303 10*3/uL (ref 150–450)
RBC: 3.85 x10E6/uL (ref 3.77–5.28)
RDW: 11.2 % — ABNORMAL LOW (ref 11.7–15.4)
WBC: 7.5 10*3/uL (ref 3.4–10.8)

## 2021-02-05 LAB — COMPREHENSIVE METABOLIC PANEL
ALT: 15 IU/L (ref 0–32)
AST: 29 IU/L (ref 0–40)
Albumin/Globulin Ratio: 1.6 (ref 1.2–2.2)
Albumin: 4.2 g/dL (ref 3.7–4.7)
Alkaline Phosphatase: 95 IU/L (ref 44–121)
BUN/Creatinine Ratio: 15 (ref 12–28)
BUN: 19 mg/dL (ref 8–27)
Bilirubin Total: 0.6 mg/dL (ref 0.0–1.2)
CO2: 23 mmol/L (ref 20–29)
Calcium: 10.5 mg/dL — ABNORMAL HIGH (ref 8.7–10.3)
Chloride: 103 mmol/L (ref 96–106)
Creatinine, Ser: 1.27 mg/dL — ABNORMAL HIGH (ref 0.57–1.00)
Globulin, Total: 2.7 g/dL (ref 1.5–4.5)
Glucose: 101 mg/dL — ABNORMAL HIGH (ref 65–99)
Potassium: 4.5 mmol/L (ref 3.5–5.2)
Sodium: 142 mmol/L (ref 134–144)
Total Protein: 6.9 g/dL (ref 6.0–8.5)
eGFR: 43 mL/min/{1.73_m2} — ABNORMAL LOW (ref >59)

## 2021-02-05 LAB — T4, FREE: Free T4: 1.22 ng/dL (ref 0.82–1.77)

## 2021-02-05 LAB — TSH: TSH: 1.14 u[IU]/mL (ref 0.450–4.500)

## 2021-02-07 LAB — CULTURE, URINE COMPREHENSIVE

## 2021-02-11 ENCOUNTER — Telehealth (INDEPENDENT_AMBULATORY_CARE_PROVIDER_SITE_OTHER): Payer: PPO | Admitting: Family Medicine

## 2021-02-11 DIAGNOSIS — N390 Urinary tract infection, site not specified: Secondary | ICD-10-CM | POA: Diagnosis not present

## 2021-02-11 DIAGNOSIS — R82998 Other abnormal findings in urine: Secondary | ICD-10-CM | POA: Diagnosis not present

## 2021-02-11 LAB — POCT URINALYSIS DIPSTICK
Bilirubin, UA: NEGATIVE
Blood, UA: NEGATIVE
Glucose, UA: NEGATIVE
Ketones, UA: NEGATIVE
Nitrite, UA: NEGATIVE
Protein, UA: NEGATIVE
Spec Grav, UA: <=1.005 — AB (ref 1.010–1.025)
Urobilinogen, UA: 0.2 E.U./dL
pH, UA: 6.5 (ref 5.0–8.0)

## 2021-02-11 NOTE — Telephone Encounter (Signed)
Please advise. Patient has a f/u appt w/ you on 03/27/21.

## 2021-02-11 NOTE — Telephone Encounter (Signed)
She can stop by this week sometime for u/a

## 2021-02-11 NOTE — Telephone Encounter (Signed)
Pt is calling to ask Dr. Caryn Section does she need to come back in a be tested for Kidney infection. Reports that she finished the medication Saturday morning. Her urine is yellow in color. And her sugar yesterday 232, Saturday was 200, Today 154.  Please advise Cb- [phone]

## 2021-02-11 NOTE — Telephone Encounter (Signed)
Pt stated she will stop by today to get a u/a.

## 2021-02-11 NOTE — Telephone Encounter (Signed)
Patient came by the office and urine sample was collected. I provided patient teaching on how to collect a clean catch urine specimen prior to patient collection. POCT UA has small leukocytes. Please review results and advise if urine sample needs to be sent to the lab for culture. I will place the sample in the refrigerator for now to preserve the sample.

## 2021-02-12 ENCOUNTER — Telehealth: Payer: Self-pay

## 2021-02-12 DIAGNOSIS — N390 Urinary tract infection, site not specified: Secondary | ICD-10-CM

## 2021-02-12 NOTE — Telephone Encounter (Signed)
Done. Urine culture sent to [facility].

## 2021-02-12 NOTE — Telephone Encounter (Signed)
Copied from [place] [id number]. Topic: General - Other >> Feb 12, 2021  3:28 PM Leward Quan A wrote: Reason for CRM: Patient called in to inquire of Dr Caryn Section about her urine sample dropped off on 02/11/21 waiting for a call back at Ph# [phone]

## 2021-02-12 NOTE — Telephone Encounter (Signed)
Ok, please send for culture. Thanks!

## 2021-02-14 ENCOUNTER — Other Ambulatory Visit: Payer: Self-pay | Admitting: Family Medicine

## 2021-02-14 DIAGNOSIS — J45909 Unspecified asthma, uncomplicated: Secondary | ICD-10-CM

## 2021-02-14 DIAGNOSIS — J309 Allergic rhinitis, unspecified: Secondary | ICD-10-CM

## 2021-02-15 LAB — URINE CULTURE

## 2021-02-15 MED ORDER — CEFDINIR 300 MG PO CAPS: 600.0000 mg | ORAL_CAPSULE | Freq: Every day | ORAL | 0 refills | Status: AC

## 2021-02-15 NOTE — Telephone Encounter (Signed)
Will change to cefdinir. Have sent antibiotic to [facility]

## 2021-02-15 NOTE — Telephone Encounter (Signed)
Patient is still having symptoms. She has another message regarding this. You recommended an abx. Please send into the pharmacy. Thanks!

## 2021-02-15 NOTE — Telephone Encounter (Signed)
Patient called in stated that her urine is still yellow with a foul smell. Say that her blood sugar is down a little after the medication but would like a call back from Dr Caryn Section today going out of town this weekend Ph# [phone]

## 2021-02-15 NOTE — Telephone Encounter (Signed)
Copied from [place] [id number]. Topic: General - Other >> Feb 12, 2021  3:28 PM Leward Quan A wrote: Reason for CRM: Patient called in to inquire of Dr Caryn Section about her urine sample dropped off on 02/11/21 waiting for a call back at Ph# [phone] >> Feb 15, 2021 12:24 PM Yvette Rack wrote: Pt stated she will be going out of town and she requests a call back to go over results of most recent urine specimen. Pt stated if  Rx is needed she will need for it to be sent to her pharmacy today because she will be leaving tomorrow morning. Pt requests call back.

## 2021-02-15 NOTE — Telephone Encounter (Signed)
Patient advised that prescription has been changed and sent to [facility].

## 2021-02-18 NOTE — Telephone Encounter (Signed)
Patient has been treated for this.

## 2021-02-25 NOTE — Progress Notes (Signed)
I,[name],acting as a scribe for Destiny Mans, MD.,have documented all relevant documentation on the behalf of Destiny Mans, MD,as directed by  Destiny Mans, MD while in the presence of Destiny Mans, MD.  Established patient visit   Patient: Destiny Gilbert   DOB: 23-Oct-1940   80 y.o. Female  MRN: [id number] Visit Date: 02/26/2021  Today's healthcare provider: [name], MD   Chief Complaint  Patient presents with   Ear Pain   Subjective  -------------------------------------------------------------------------------------------------------------------- Otalgia  There is pain in both ears. This is a new problem. The current episode started in the past 7 days (4 days ago). The problem occurs every few minutes. The problem has been unchanged. There has been no fever. Associated symptoms include headaches, neck pain and rhinorrhea. Pertinent negatives include no abdominal pain, coughing, diarrhea, ear discharge, hearing loss, rash, sore throat or vomiting. She has tried ear drops for the symptoms. The treatment provided no relief.    Patient has had bilateral ear pain for 4 days. Patient states the pain was worse yesterday. Patient states her head and ears filled stuffy. Patient also has pain in the back of her head/neck, and slightly runny nose. Patient has no fever, no sore throat and no cough. Patient takes a mucus tablets regularly. She used ear drops but got no relief. Patient also states she has been on an antibiotic for about 3 weeks for UTI. No fevers or chills.  No shortness of breath.    Medications: Outpatient Medications Prior to Visit  Medication Sig   acetaminophen (TYLENOL) 500 MG tablet Take 500 mg by mouth every 6 (six) hours as needed.   ADVAIR DISKUS 250-50 MCG/DOSE AEPB INHALE 1 DOSE BY MOUTH TWICE DAILY (Patient taking differently: Inhale 1 puff into the lungs daily.)   albuterol (PROAIR HFA) 108 (90 Base) MCG/ACT inhaler  Inhale 2 puffs into the lungs every 6 (six) hours as needed.   amLODipine (NORVASC) 2.5 MG tablet Take 1 tablet (2.5 mg total) by mouth daily.   atorvastatin (LIPITOR) 40 MG tablet TAKE 1 TABLET BY MOUTH ONCE DAILY AT  6PM   ELIQUIS 5 MG TABS tablet Take 5 mg by mouth every 12 (twelve) hours.   guaiFENesin (MUCUS RELIEF PO) Take by mouth.   levocetirizine (XYZAL) 5 MG tablet TAKE 1 TABLET BY MOUTH ONCE DAILY IN THE EVENING   levothyroxine (EUTHYROX) 50 MCG tablet TAKE 1 TABLET BY MOUTH ONCE DAILY BEFORE BREAKFAST   metFORMIN (GLUCOPHAGE-XR) 500 MG 24 hr tablet TAKE 1 TABLET BY MOUTH ONCE DAILY FOR BLOOD SUGAR   nitroGLYCERIN (NITROSTAT) 0.3 MG SL tablet Place 1 tablet (0.3 mg total) under the tongue every 5 (five) minutes as needed for chest pain.   omeprazole (PRILOSEC) 20 MG capsule Take 20 mg by mouth daily.   ONETOUCH ULTRA test strip USE 1 STRIP TO CHECK GLUCOSE ONCE DAILY   potassium chloride (KLOR-CON) 10 MEQ tablet Take 1 tablet (10 mEq total) by mouth 2 (two) times daily.   PROCTO-MED HC 2.5 % rectal cream APPLY CREAM RECTALLY TO AFFECTED AREA TWICE DAILY   triamterene-hydrochlorothiazide (MAXZIDE) 75-50 MG tablet Take 1 tablet by mouth daily.   No facility-administered medications prior to visit.    Review of Systems  HENT:  Positive for ear pain and rhinorrhea. Negative for ear discharge, hearing loss and sore throat.   Respiratory:  Negative for cough.   Gastrointestinal:  Negative for abdominal pain, diarrhea and vomiting.  Musculoskeletal:  Positive for neck pain.  Skin:  Negative for rash.  Neurological:  Positive for headaches.       Objective  -------------------------------------------------------------------------------------------------------------------- BP 110/67 (BP Location: Right Arm, Patient Position: Sitting, Cuff Size: Large)   Pulse 80   Temp 98.2 F (36.8 C) (Oral)   Resp 16   Wt 163 lb (73.9 kg)   SpO2 94%   BMI 29.81 kg/m  BP Readings from Last 3  Encounters:  02/26/21 110/67  02/04/21 112/70  10/24/20 (!) 151/68   Wt Readings from Last 3 Encounters:  02/26/21 163 lb (73.9 kg)  02/04/21 165 lb (74.8 kg)  10/24/20 168 lb 12.8 oz (76.6 kg)       Physical Exam Vitals reviewed.  Constitutional:      Appearance: Normal appearance.  HENT:     Head: Normocephalic and atraumatic.     Right Ear: Tympanic membrane and external ear normal.     Left Ear: Tympanic membrane and external ear normal.     Ears:     Comments: Both TMs are full but not infected Eyes:     General: No scleral icterus.    Conjunctiva/sclera: Conjunctivae normal.  Cardiovascular:     Rate and Rhythm: Normal rate and regular rhythm.     Heart sounds: Normal heart sounds.  Pulmonary:     Effort: Pulmonary effort is normal. No respiratory distress.     Breath sounds: Normal breath sounds.  Skin:    General: Skin is warm and dry.  Neurological:     Mental Status: She is alert and oriented to person, place, and time.  Psychiatric:        Mood and Affect: Mood normal.        Behavior: Behavior normal.        Thought Content: Thought content normal.        Judgment: Judgment normal.      No results found for any visits on 02/26/21.  Assessment & Plan  ---------------------------------------------------------------------------------------------------------------------- 1. Salpingitis of both eustachian tubes Treat allergies.  Could be a URI on top of allergies. - loratadine (CLARITIN) 10 MG tablet; Take 1 tablet (10 mg total) by mouth daily.  Dispense: 30 tablet; Refill: 1  2. Allergic rhinitis, unspecified seasonality, unspecified trigger  - loratadine (CLARITIN) 10 MG tablet; Take 1 tablet (10 mg total) by mouth daily.  Dispense: 30 tablet; Refill: 1  3. Urinary tract infection with hematuria, site unspecified UA negative.  Stop antibiotics. - POCT urinalysis dipstick   No follow-ups on file.      I, Destiny Mans, MD, have reviewed all  documentation for this visit. The documentation on 03/04/21 for the exam, diagnosis, procedures, and orders are all accurate and complete.    Destiny Moll Wendelyn Breslow, MD  [facility] [phone] (phone) [phone] (fax)  [facility]

## 2021-02-26 ENCOUNTER — Other Ambulatory Visit: Payer: Self-pay

## 2021-02-26 ENCOUNTER — Encounter: Payer: Self-pay | Admitting: Family Medicine

## 2021-02-26 ENCOUNTER — Ambulatory Visit (INDEPENDENT_AMBULATORY_CARE_PROVIDER_SITE_OTHER): Payer: PPO | Admitting: Family Medicine

## 2021-02-26 VITALS — BP 110/67 | HR 80 | Temp 98.2°F | Resp 16 | Wt 163.0 lb

## 2021-02-26 DIAGNOSIS — J309 Allergic rhinitis, unspecified: Secondary | ICD-10-CM | POA: Diagnosis not present

## 2021-02-26 DIAGNOSIS — R319 Hematuria, unspecified: Secondary | ICD-10-CM

## 2021-02-26 DIAGNOSIS — N390 Urinary tract infection, site not specified: Secondary | ICD-10-CM

## 2021-02-26 DIAGNOSIS — H68003 Unspecified Eustachian salpingitis, bilateral: Secondary | ICD-10-CM | POA: Diagnosis not present

## 2021-02-26 LAB — POCT URINALYSIS DIPSTICK
Bilirubin, UA: NEGATIVE
Blood, UA: POSITIVE
Glucose, UA: NEGATIVE
Ketones, UA: NEGATIVE
Nitrite, UA: NEGATIVE
Protein, UA: NEGATIVE
Spec Grav, UA: 1.01 (ref 1.010–1.025)
Urobilinogen, UA: 0.2 E.U./dL
pH, UA: 5 (ref 5.0–8.0)

## 2021-02-26 MED ORDER — LORATADINE 10 MG PO TABS: 10.0000 mg | ORAL_TABLET | Freq: Every day | ORAL | 1 refills | Status: DC

## 2021-03-05 ENCOUNTER — Other Ambulatory Visit: Payer: Self-pay | Admitting: Family Medicine

## 2021-03-05 DIAGNOSIS — I10 Essential (primary) hypertension: Secondary | ICD-10-CM

## 2021-03-27 ENCOUNTER — Other Ambulatory Visit: Payer: Self-pay | Admitting: Family Medicine

## 2021-03-27 ENCOUNTER — Other Ambulatory Visit: Payer: Self-pay

## 2021-03-27 ENCOUNTER — Encounter: Payer: Self-pay | Admitting: Family Medicine

## 2021-03-27 ENCOUNTER — Ambulatory Visit (INDEPENDENT_AMBULATORY_CARE_PROVIDER_SITE_OTHER): Payer: PPO | Admitting: Family Medicine

## 2021-03-27 VITALS — BP 127/67 | HR 82 | Temp 98.5°F | Ht 62.0 in | Wt 162.0 lb

## 2021-03-27 DIAGNOSIS — E782 Mixed hyperlipidemia: Secondary | ICD-10-CM | POA: Diagnosis not present

## 2021-03-27 DIAGNOSIS — Z0000 Encounter for general adult medical examination without abnormal findings: Secondary | ICD-10-CM | POA: Diagnosis not present

## 2021-03-27 DIAGNOSIS — Z23 Encounter for immunization: Secondary | ICD-10-CM | POA: Diagnosis not present

## 2021-03-27 DIAGNOSIS — E119 Type 2 diabetes mellitus without complications: Secondary | ICD-10-CM

## 2021-03-27 DIAGNOSIS — Z87440 Personal history of urinary (tract) infections: Secondary | ICD-10-CM | POA: Diagnosis not present

## 2021-03-27 DIAGNOSIS — E1159 Type 2 diabetes mellitus with other circulatory complications: Secondary | ICD-10-CM

## 2021-03-27 LAB — POCT URINALYSIS DIPSTICK
Bilirubin, UA: NEGATIVE
Glucose, UA: NEGATIVE
Ketones, UA: NEGATIVE
Nitrite, UA: NEGATIVE
Protein, UA: NEGATIVE
Spec Grav, UA: 1.015 (ref 1.010–1.025)
Urobilinogen, UA: 0.2 E.U./dL
pH, UA: 5 (ref 5.0–8.0)

## 2021-03-27 LAB — POCT UA - MICROALBUMIN: Microalbumin Ur, POC: 50 mg/L

## 2021-03-27 MED ORDER — ATORVASTATIN CALCIUM 40 MG PO TABS: 40.0000 mg | ORAL_TABLET | Freq: Every day | ORAL | 3 refills | Status: DC

## 2021-03-27 NOTE — Progress Notes (Deleted)
Annual Wellness Visit     Patient: Destiny Gilbert, Female    DOB: 14-Jul-1940, 80 y.o.   MRN: [id number] Visit Date: 03/27/2021  Today's Provider: Lelon Huh, MD   Chief Complaint  Patient presents with   Annual Exam   Subjective    Destiny Gilbert is a 80 y.o. female who presents today for her Annual Wellness Visit. She reports consuming a general diet.  She generally feels fairly well. She reports sleeping fairly well. She does not have additional problems to discuss today.   HPI    Medications: Outpatient Medications Prior to Visit  Medication Sig   acetaminophen (TYLENOL) 500 MG tablet Take 500 mg by mouth every 6 (six) hours as needed.   ADVAIR DISKUS 250-50 MCG/DOSE AEPB INHALE 1 DOSE BY MOUTH TWICE DAILY (Patient taking differently: Inhale 1 puff into the lungs daily.)   albuterol (PROAIR HFA) 108 (90 Base) MCG/ACT inhaler Inhale 2 puffs into the lungs every 6 (six) hours as needed.   amLODipine (NORVASC) 2.5 MG tablet Take 1 tablet (2.5 mg total) by mouth daily.   atorvastatin (LIPITOR) 40 MG tablet TAKE 1 TABLET BY MOUTH ONCE DAILY AT  6PM   ELIQUIS 5 MG TABS tablet Take 5 mg by mouth every 12 (twelve) hours.   guaiFENesin (MUCUS RELIEF PO) Take by mouth.   levocetirizine (XYZAL) 5 MG tablet TAKE 1 TABLET BY MOUTH ONCE DAILY IN THE EVENING   levothyroxine (EUTHYROX) 50 MCG tablet TAKE 1 TABLET BY MOUTH ONCE DAILY BEFORE BREAKFAST   loratadine (CLARITIN) 10 MG tablet Take 1 tablet (10 mg total) by mouth daily.   metFORMIN (GLUCOPHAGE-XR) 500 MG 24 hr tablet TAKE 1 TABLET BY MOUTH ONCE DAILY FOR BLOOD SUGAR   nitroGLYCERIN (NITROSTAT) 0.3 MG SL tablet Place 1 tablet (0.3 mg total) under the tongue every 5 (five) minutes as needed for chest pain.   ONETOUCH ULTRA test strip USE 1 STRIP TO CHECK GLUCOSE ONCE DAILY   potassium chloride (KLOR-CON) 10 MEQ tablet Take 1 tablet by mouth twice daily   PROCTO-MED HC 2.5 % rectal cream APPLY CREAM RECTALLY TO AFFECTED AREA  TWICE DAILY   triamterene-hydrochlorothiazide (MAXZIDE) 75-50 MG tablet Take 1 tablet by mouth daily.   omeprazole (PRILOSEC) 20 MG capsule Take 20 mg by mouth daily.   No facility-administered medications prior to visit.    Allergies  Allergen Reactions   Dipyridamole Nausea Only    Headache   Sulfa Antibiotics Nausea And Vomiting    Patient Care Team: Birdie Sons, MD as PCP - General (Family Medicine) Isaias Cowman, MD as Consulting Physician (Cardiology) Anabel Bene, MD as Referring Physician (Neurology) Clyde Canterbury, MD as Referring Physician (Otolaryngology) Leandrew Koyanagi, MD as Referring Physician (Ophthalmology) Efrain Sella, MD as Consulting Physician (Gastroenterology)  Review of Systems  {Labs  Heme  Chem  Endocrine  Serology  Results Review (optional):23779}    Objective    Vitals: BP 127/67 (BP Location: Right Arm, Patient Position: Sitting, Cuff Size: Large)   Pulse 82   Temp 98.5 F (36.9 C) (Oral)   Ht 5\' 2"  (1.575 m)   Wt 162 lb (73.5 kg)   SpO2 96%   BMI 29.63 kg/m  {Show previous vital signs (optional):23777}  Physical Exam ***  Most recent functional status assessment: In your present state of health, do you have any difficulty performing the following activities: 03/27/2021  Hearing? N  Vision? N  Difficulty concentrating or making decisions? N  Walking or climbing stairs? N  Dressing or bathing? N  Doing errands, shopping? N  Some recent data might be hidden   Most recent fall risk assessment: Fall Risk  03/27/2021  Falls in the past year? 0  Comment -  Number falls in past yr: 0  Injury with Fall? 0  Risk for fall due to : No Fall Risks  Follow up Falls evaluation completed    Most recent depression screenings: PHQ 2/9 Scores 03/27/2021 10/24/2020  PHQ - 2 Score 0 0  PHQ- 9 Score 1 0   Most recent cognitive screening: 6CIT Screen 03/27/2021  What Year? 0 points  What month? 0 points  What time? 0  points  Count back from 20 0 points  Months in reverse 0 points  Repeat phrase 2 points  Total Score 2   Most recent Audit-C alcohol use screening Alcohol Use Disorder Test (AUDIT) 03/27/2021  1. How often do you have a drink containing alcohol? 1  2. How many drinks containing alcohol do you have on a typical day when you are drinking? 0  3. How often do you have six or more drinks on one occasion? 0  AUDIT-C Score 1  4. How often during the last year have you found that you were not able to stop drinking once you had started? -  5. How often during the last year have you failed to do what was normally expected from you because of drinking? -  6. How often during the last year have you needed a first drink in the morning to get yourself going after a heavy drinking session? -  7. How often during the last year have you had a feeling of guilt of remorse after drinking? -  8. How often during the last year have you been unable to remember what happened the night before because you had been drinking? -  9. Have you or someone else been injured as a result of your drinking? -  10. Has a relative or friend or a doctor or another health worker been concerned about your drinking or suggested you cut down? -  Alcohol Use Disorder Identification Test Final Score (AUDIT) -  Alcohol Brief Interventions/Follow-up -   A score of 3 or more in women, and 4 or more in men indicates increased risk for alcohol abuse, EXCEPT if all of the points are from question 1   No results found for any visits on 03/27/21.  Assessment & Plan     Annual wellness visit done today including the all of the following: Reviewed patient's Family Medical History Reviewed and updated list of patient's medical providers Assessment of cognitive impairment was done Assessed patient's functional ability Established a written schedule for health screening [place] Completed and Reviewed  Exercise  Activities and Dietary recommendations  Goals      DIET - REDUCE SUGAR INTAKE     Recommend to cut back on sugar and sweets in daily diet and subsitute for healthier snack.      Exercise 3x per week (30 min per time)     Recommend to exercise for 3 days a week for at least 30 minutes at a time.      LIFESTYLE - DECREASE FALLS RISK     Recommend to remove any items from the home that may cause slips or trips.        Immunization History  Administered Date(s) Administered   Influenza, High Dose Seasonal PF  03/18/2016, 03/27/2017, 03/29/2018, 04/05/2019, 04/10/2020   Influenza,inj,Quad PF,6+ Mos 05/17/2015   PFIZER(Purple Top)SARS-COV-2 Vaccination 07/14/2019, 08/04/2019, 04/19/2020   Pneumococcal Conjugate-13 05/20/2016   Pneumococcal Polysaccharide-23 04/12/2008, 09/12/2013   Tdap 05/03/2007   Zoster Recombinat (Shingrix) 05/18/2019, 08/24/2019   Zoster, Live 06/12/2009    Health Maintenance  Topic Date Due   TETANUS/TDAP  05/02/2017   FOOT EXAM  01/10/2020   COVID-19 Vaccine (4 - Booster for Pfizer series) 08/20/2020   OPHTHALMOLOGY EXAM  08/23/2020   URINE MICROALBUMIN  09/20/2020   INFLUENZA VACCINE  10/04/2021 (Originally 02/04/2021)   HEMOGLOBIN A1C  08/07/2021   DEXA SCAN  02/04/2023   Zoster Vaccines- Shingrix  Completed   HPV VACCINES  Aged Out     Discussed health benefits of physical activity, and encouraged her to engage in regular exercise appropriate for her age and condition.    ***  No follow-ups on file.     {provider attestation***:1}   Lelon Huh, MD  [facility] [phone] (phone) [phone] (fax)  [place]

## 2021-03-27 NOTE — Progress Notes (Signed)
Annual Wellness Visit     Patient: Destiny Gilbert, Female    DOB: 07/12/1940, 80 y.o.   MRN: [id number] Visit Date: 03/27/2021  Today's Provider: Lelon Huh, MD   Chief Complaint  Patient presents with   Annual Exam   Subjective    Destiny Gilbert is a 80 y.o. female who presents today for her Annual Wellness Visit. She reports consuming a general diet. The patient does not participate in regular exercise at present. She generally feels well. She reports sleeping well. She does not have additional problems to discuss today.   HPI    Medications: Outpatient Medications Prior to Visit  Medication Sig   acetaminophen (TYLENOL) 500 MG tablet Take 500 mg by mouth every 6 (six) hours as needed.   ADVAIR DISKUS 250-50 MCG/DOSE AEPB INHALE 1 DOSE BY MOUTH TWICE DAILY (Patient taking differently: Inhale 1 puff into the lungs daily.)   albuterol (PROAIR HFA) 108 (90 Base) MCG/ACT inhaler Inhale 2 puffs into the lungs every 6 (six) hours as needed.   amLODipine (NORVASC) 2.5 MG tablet Take 1 tablet (2.5 mg total) by mouth daily.   atorvastatin (LIPITOR) 40 MG tablet TAKE 1 TABLET BY MOUTH ONCE DAILY AT  6PM   ELIQUIS 5 MG TABS tablet Take 5 mg by mouth every 12 (twelve) hours.   guaiFENesin (MUCUS RELIEF PO) Take by mouth.   levocetirizine (XYZAL) 5 MG tablet TAKE 1 TABLET BY MOUTH ONCE DAILY IN THE EVENING   levothyroxine (EUTHYROX) 50 MCG tablet TAKE 1 TABLET BY MOUTH ONCE DAILY BEFORE BREAKFAST   loratadine (CLARITIN) 10 MG tablet Take 1 tablet (10 mg total) by mouth daily.   metFORMIN (GLUCOPHAGE-XR) 500 MG 24 hr tablet TAKE 1 TABLET BY MOUTH ONCE DAILY FOR BLOOD SUGAR   nitroGLYCERIN (NITROSTAT) 0.3 MG SL tablet Place 1 tablet (0.3 mg total) under the tongue every 5 (five) minutes as needed for chest pain.   ONETOUCH ULTRA test strip USE 1 STRIP TO CHECK GLUCOSE ONCE DAILY   potassium chloride (KLOR-CON) 10 MEQ tablet Take 1 tablet by mouth twice daily   PROCTO-MED HC 2.5 %  rectal cream APPLY CREAM RECTALLY TO AFFECTED AREA TWICE DAILY   triamterene-hydrochlorothiazide (MAXZIDE) 75-50 MG tablet Take 1 tablet by mouth daily.   omeprazole (PRILOSEC) 20 MG capsule Take 20 mg by mouth daily.   No facility-administered medications prior to visit.    Allergies  Allergen Reactions   Dipyridamole Nausea Only    Headache   Sulfa Antibiotics Nausea And Vomiting    Patient Care Team: Birdie Sons, MD as PCP - General (Family Medicine) Isaias Cowman, MD as Consulting Physician (Cardiology) Anabel Bene, MD as Referring Physician (Neurology) Clyde Canterbury, MD as Referring Physician (Otolaryngology) Leandrew Koyanagi, MD as Referring Physician (Ophthalmology) Efrain Sella, MD as Consulting Physician (Gastroenterology)  Review of Systems  Constitutional: Negative.   HENT: Negative.    Eyes: Negative.   Respiratory: Negative.    Cardiovascular: Negative.   Gastrointestinal: Negative.   Endocrine: Negative.   Genitourinary: Negative.   Musculoskeletal: Negative.   Skin: Negative.   Allergic/Immunologic: Negative.   Neurological: Negative.   Hematological: Negative.   Psychiatric/Behavioral: Negative.         Objective    Vitals: BP 127/67 (BP Location: Right Arm, Patient Position: Sitting, Cuff Size: Large)   Pulse 82   Temp 98.5 F (36.9 C) (Oral)   Ht 5\' 2"  (1.575 m)   Wt 162 lb (73.5  kg)   SpO2 96%   BMI 29.63 kg/m    Physical Exam  General Appearance:     Well developed, well nourished female. Alert, cooperative, in no acute distress, appears stated age   Head:    Normocephalic, without obvious abnormality, atraumatic  Eyes:    PERRL, conjunctiva/corneas clear, EOM's intact, fundi    benign, both eyes  Ears:    Normal TM's and external ear canals, both ears  Neck:   Supple, symmetrical, trachea midline, no adenopathy;    thyroid:  no enlargement/tenderness/nodules; no carotid   bruit or JVD  Back:     Symmetric,  no curvature, ROM normal, no CVA tenderness  Lungs:     Clear to auscultation bilaterally, respirations unlabored  Chest Wall:    No tenderness or deformity   Heart:    Normal heart rate. Regular rhythm. No murmurs, rubs, or gallops.   Breast Exam:    normal appearance, no masses or tenderness  Abdomen:     Soft, non-tender, bowel sounds active all four quadrants,    no masses, no organomegaly  Pelvic:    deferred  Extremities:   All extremities are intact. No cyanosis or edema  Pulses:   2+ and symmetric all extremities  Skin:   Skin color, texture, turgor normal, no rashes or lesions  Lymph nodes:   Cervical, supraclavicular, and axillary nodes normal  Neurologic:   CNII-XII intact, normal strength, sensation and reflexes    throughout    Results for orders placed or performed in visit on 03/27/21  POCT urinalysis dipstick  Result Value Ref Range   Color, UA     Clarity, UA     Glucose, UA Negative Negative   Bilirubin, UA Negative    Ketones, UA Negative    Spec Grav, UA 1.015 1.010 - 1.025   Blood, UA Small    pH, UA 5.0 5.0 - 8.0   Protein, UA Negative Negative   Urobilinogen, UA 0.2 0.2 or 1.0 E.U./dL   Nitrite, UA Negative    Leukocytes, UA Large (3+) (A) Negative   Appearance     Odor    POCT UA - Microalbumin  Result Value Ref Range   Microalbumin Ur, POC 50 mg/L     Assessment & Plan     1. Annual physical exam   2. History of UTI Positive nitrates on u/a today, but asymptomatic.  - Urine Culture  3. Need for influenza vaccination  - Flu Vaccine QUAD High Dose IM (Fluad)  4. Mixed hyperlipidemia  - atorvastatin (LIPITOR) 40 MG tablet; Take 1 tablet (40 mg total) by mouth daily.  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 Portions of this information were initially documented by the CMA and reviewed by me for thoroughness and accuracy.     Lelon Huh, MD  [facility] [phone] (phone) [phone] (fax)  [place]

## 2021-03-27 NOTE — Patient Instructions (Signed)
.   Please review the attached list of medications and notify my office if there are any errors.   . Please contact your eyecare professional to schedule a routine eye exam  

## 2021-03-29 ENCOUNTER — Telehealth: Payer: Self-pay

## 2021-03-29 DIAGNOSIS — N390 Urinary tract infection, site not specified: Secondary | ICD-10-CM

## 2021-03-29 MED ORDER — CEPHALEXIN 500 MG PO CAPS: 500.0000 mg | ORAL_CAPSULE | Freq: Three times a day (TID) | ORAL | 0 refills | Status: AC

## 2021-03-29 NOTE — Telephone Encounter (Signed)
Culture is still not back yet. Will go ahead and send prescription for cephalexin which she can take while waiting for results.

## 2021-03-29 NOTE — Telephone Encounter (Signed)
Patient advised.

## 2021-03-29 NOTE — Progress Notes (Signed)
Annual Wellness Visit     Patient: Destiny Gilbert, Female    DOB: Jun 16, 1941, 80 y.o.   MRN: [id number] Visit Date: 03/27/2021  Today's Provider: Lelon Huh, MD   Chief Complaint  Patient presents with   Annual Exam   Hypertension   Hyperlipidemia   Diabetes   Subjective    Destiny Gilbert is a 80 y.o. female who presents today for her Annual Wellness Visit.   Medications: Outpatient Medications Prior to Visit  Medication Sig   acetaminophen (TYLENOL) 500 MG tablet Take 500 mg by mouth every 6 (six) hours as needed.   ADVAIR DISKUS 250-50 MCG/DOSE AEPB INHALE 1 DOSE BY MOUTH TWICE DAILY (Patient taking differently: Inhale 1 puff into the lungs daily.)   albuterol (PROAIR HFA) 108 (90 Base) MCG/ACT inhaler Inhale 2 puffs into the lungs every 6 (six) hours as needed.   amLODipine (NORVASC) 2.5 MG tablet Take 1 tablet (2.5 mg total) by mouth daily.   ELIQUIS 5 MG TABS tablet Take 5 mg by mouth every 12 (twelve) hours.   guaiFENesin (MUCUS RELIEF PO) Take by mouth.   levocetirizine (XYZAL) 5 MG tablet TAKE 1 TABLET BY MOUTH ONCE DAILY IN THE EVENING   levothyroxine (EUTHYROX) 50 MCG tablet TAKE 1 TABLET BY MOUTH ONCE DAILY BEFORE BREAKFAST   loratadine (CLARITIN) 10 MG tablet Take 1 tablet (10 mg total) by mouth daily.   metFORMIN (GLUCOPHAGE-XR) 500 MG 24 hr tablet TAKE 1 TABLET BY MOUTH ONCE DAILY FOR BLOOD SUGAR   nitroGLYCERIN (NITROSTAT) 0.3 MG SL tablet Place 1 tablet (0.3 mg total) under the tongue every 5 (five) minutes as needed for chest pain.   ONETOUCH ULTRA test strip USE 1 STRIP TO CHECK GLUCOSE ONCE DAILY   potassium chloride (KLOR-CON) 10 MEQ tablet Take 1 tablet by mouth twice daily   PROCTO-MED HC 2.5 % rectal cream APPLY CREAM RECTALLY TO AFFECTED AREA TWICE DAILY   triamterene-hydrochlorothiazide (MAXZIDE) 75-50 MG tablet Take 1 tablet by mouth daily.   [DISCONTINUED] atorvastatin (LIPITOR) 40 MG tablet TAKE 1 TABLET BY MOUTH ONCE DAILY AT  6PM    [DISCONTINUED] omeprazole (PRILOSEC) 20 MG capsule Take 20 mg by mouth daily.   No facility-administered medications prior to visit.    Allergies  Allergen Reactions   Dipyridamole Nausea Only    Headache   Sulfa Antibiotics Nausea And Vomiting    Patient Care Team: Birdie Sons, MD as PCP - General (Family Medicine) Isaias Cowman, MD as Consulting Physician (Cardiology) Anabel Bene, MD as Referring Physician (Neurology) Clyde Canterbury, MD as Referring Physician (Otolaryngology) Leandrew Koyanagi, MD as Referring Physician (Ophthalmology) Efrain Sella, MD as Consulting Physician (Gastroenterology)        Objective    Vitals: BP 127/67 (BP Location: Right Arm, Patient Position: Sitting, Cuff Size: Large)   Pulse 82   Temp 98.5 F (36.9 C) (Oral)   Ht 5\' 2"  (1.575 m)   Wt 162 lb (73.5 kg)   SpO2 96%   BMI 29.63 kg/m    Most recent functional status assessment: In your present state of health, do you have any difficulty performing the following activities: 03/27/2021  Hearing? N  Vision? N  Difficulty concentrating or making decisions? N  Walking or climbing stairs? N  Dressing or bathing? N  Doing errands, shopping? N  Some recent data might be hidden   Most recent fall risk assessment: Fall Risk  03/27/2021  Falls in the past year? 0  Comment -  Number falls in past yr: 0  Injury with Fall? 0  Risk for fall due to : No Fall Risks  Follow up Falls evaluation completed    Most recent depression screenings: PHQ 2/9 Scores 03/27/2021 10/24/2020  PHQ - 2 Score 0 0  PHQ- 9 Score 1 0   Most recent cognitive screening: 6CIT Screen 03/27/2021  What Year? 0 points  What month? 0 points  What time? 0 points  Count back from 20 0 points  Months in reverse 0 points  Repeat phrase 2 points  Total Score 2   Most recent Audit-C alcohol use screening Alcohol Use Disorder Test (AUDIT) 03/27/2021  1. How often do you have a drink containing  alcohol? 1  2. How many drinks containing alcohol do you have on a typical day when you are drinking? 0  3. How often do you have six or more drinks on one occasion? 0  AUDIT-C Score 1  4. How often during the last year have you found that you were not able to stop drinking once you had started? -  5. How often during the last year have you failed to do what was normally expected from you because of drinking? -  6. How often during the last year have you needed a first drink in the morning to get yourself going after a heavy drinking session? -  7. How often during the last year have you had a feeling of guilt of remorse after drinking? -  8. How often during the last year have you been unable to remember what happened the night before because you had been drinking? -  9. Have you or someone else been injured as a result of your drinking? -  10. Has a relative or friend or a doctor or another health worker been concerned about your drinking or suggested you cut down? -  Alcohol Use Disorder Identification Test Final Score (AUDIT) -  Alcohol Brief Interventions/Follow-up -   A score of 3 or more in women, and 4 or more in men indicates increased risk for alcohol abuse, EXCEPT if all of the points are from question 1     Assessment & Plan     Annual wellness visit done today including the all of the following: Reviewed patient's Family Medical History Reviewed and updated list of patient's medical providers Assessment of cognitive impairment was done Assessed patient's functional ability Established a written schedule for health screening [place] Completed and Reviewed  Exercise Activities and Dietary recommendations  Goals      DIET - REDUCE SUGAR INTAKE     Recommend to cut back on sugar and sweets in daily diet and subsitute for healthier snack.      Exercise 3x per week (30 min per time)     Recommend to exercise for 3 days a week for at least 30 minutes at  a time.      LIFESTYLE - DECREASE FALLS RISK     Recommend to remove any items from the home that may cause slips or trips.       Immunization History  Administered Date(s) Administered   Fluad Quad(high Dose 65+) 03/27/2021   Influenza, High Dose Seasonal PF 03/18/2016, 03/27/2017, 03/29/2018, 04/05/2019, 04/10/2020   Influenza,inj,Quad PF,6+ Mos 05/17/2015   PFIZER(Purple Top)SARS-COV-2 Vaccination 07/14/2019, 08/04/2019, 04/19/2020   Pneumococcal Conjugate-13 05/20/2016   Pneumococcal Polysaccharide-23 04/12/2008, 09/12/2013   Tdap 05/03/2007   Zoster Recombinat (Shingrix) 05/18/2019, 08/24/2019  Zoster, Live 06/12/2009    Health Maintenance  Topic Date Due   TETANUS/TDAP  05/02/2017   FOOT EXAM  01/10/2020   COVID-19 Vaccine (4 - Booster for Pfizer series) 08/20/2020   OPHTHALMOLOGY EXAM  08/23/2020   HEMOGLOBIN A1C  08/07/2021   URINE MICROALBUMIN  03/27/2022   DEXA SCAN  02/04/2023   INFLUENZA VACCINE  Completed   Zoster Vaccines- Shingrix  Completed   HPV VACCINES  Aged Out     Discussed health benefits of physical activity, and encouraged her to engage in regular exercise appropriate for her age and condition.        The entirety of the information documented in the History of Present Illness, Review of Systems and Physical Exam were personally obtained by me. Portions of this information were initially documented by the CMA and reviewed by me for thoroughness and accuracy.     Lelon Huh, MD  [facility] [phone] (phone) [phone] (fax)  [place]

## 2021-03-29 NOTE — Progress Notes (Deleted)
Annual Wellness Visit     Patient: Destiny Gilbert, Female    DOB: Jul 26, 1940, 80 y.o.   MRN: [id number] Visit Date: 03/27/2021  Today's Provider: Lelon Huh, MD   Chief Complaint  Patient presents with   Annual Exam   Hypertension   Hyperlipidemia   Diabetes   Subjective    Destiny Gilbert is a 80 y.o. female who presents today for her Annual Wellness Visit.   Medications: Outpatient Medications Prior to Visit  Medication Sig   acetaminophen (TYLENOL) 500 MG tablet Take 500 mg by mouth every 6 (six) hours as needed.   ADVAIR DISKUS 250-50 MCG/DOSE AEPB INHALE 1 DOSE BY MOUTH TWICE DAILY (Patient taking differently: Inhale 1 puff into the lungs daily.)   albuterol (PROAIR HFA) 108 (90 Base) MCG/ACT inhaler Inhale 2 puffs into the lungs every 6 (six) hours as needed.   amLODipine (NORVASC) 2.5 MG tablet Take 1 tablet (2.5 mg total) by mouth daily.   ELIQUIS 5 MG TABS tablet Take 5 mg by mouth every 12 (twelve) hours.   guaiFENesin (MUCUS RELIEF PO) Take by mouth.   levocetirizine (XYZAL) 5 MG tablet TAKE 1 TABLET BY MOUTH ONCE DAILY IN THE EVENING   levothyroxine (EUTHYROX) 50 MCG tablet TAKE 1 TABLET BY MOUTH ONCE DAILY BEFORE BREAKFAST   loratadine (CLARITIN) 10 MG tablet Take 1 tablet (10 mg total) by mouth daily.   metFORMIN (GLUCOPHAGE-XR) 500 MG 24 hr tablet TAKE 1 TABLET BY MOUTH ONCE DAILY FOR BLOOD SUGAR   nitroGLYCERIN (NITROSTAT) 0.3 MG SL tablet Place 1 tablet (0.3 mg total) under the tongue every 5 (five) minutes as needed for chest pain.   ONETOUCH ULTRA test strip USE 1 STRIP TO CHECK GLUCOSE ONCE DAILY   potassium chloride (KLOR-CON) 10 MEQ tablet Take 1 tablet by mouth twice daily   PROCTO-MED HC 2.5 % rectal cream APPLY CREAM RECTALLY TO AFFECTED AREA TWICE DAILY   triamterene-hydrochlorothiazide (MAXZIDE) 75-50 MG tablet Take 1 tablet by mouth daily.   [DISCONTINUED] atorvastatin (LIPITOR) 40 MG tablet TAKE 1 TABLET BY MOUTH ONCE DAILY AT  6PM    [DISCONTINUED] omeprazole (PRILOSEC) 20 MG capsule Take 20 mg by mouth daily.   No facility-administered medications prior to visit.    Allergies  Allergen Reactions   Dipyridamole Nausea Only    Headache   Sulfa Antibiotics Nausea And Vomiting    Patient Care Team: Birdie Sons, MD as PCP - General (Family Medicine) Isaias Cowman, MD as Consulting Physician (Cardiology) Anabel Bene, MD as Referring Physician (Neurology) Clyde Canterbury, MD as Referring Physician (Otolaryngology) Leandrew Koyanagi, MD as Referring Physician (Ophthalmology) Efrain Sella, MD as Consulting Physician (Gastroenterology)    {Labs  Heme  Chem  Endocrine  Serology  Results Review (optional):23779}    Objective    Vitals: BP 127/67 (BP Location: Right Arm, Patient Position: Sitting, Cuff Size: Large)   Pulse 82   Temp 98.5 F (36.9 C) (Oral)   Ht 5\' 2"  (1.575 m)   Wt 162 lb (73.5 kg)   SpO2 96%   BMI 29.63 kg/m  {Show previous vital signs (optional):23777}  Most recent functional status assessment: In your present state of health, do you have any difficulty performing the following activities: 03/27/2021  Hearing? N  Vision? N  Difficulty concentrating or making decisions? N  Walking or climbing stairs? N  Dressing or bathing? N  Doing errands, shopping? N  Some recent data might be hidden  Most recent fall risk assessment: Fall Risk  03/27/2021  Falls in the past year? 0  Comment -  Number falls in past yr: 0  Injury with Fall? 0  Risk for fall due to : No Fall Risks  Follow up Falls evaluation completed    Most recent depression screenings: PHQ 2/9 Scores 03/27/2021 10/24/2020  PHQ - 2 Score 0 0  PHQ- 9 Score 1 0   Most recent cognitive screening: 6CIT Screen 03/27/2021  What Year? 0 points  What month? 0 points  What time? 0 points  Count back from 20 0 points  Months in reverse 0 points  Repeat phrase 2 points  Total Score 2   Most recent  Audit-C alcohol use screening Alcohol Use Disorder Test (AUDIT) 03/27/2021  1. How often do you have a drink containing alcohol? 1  2. How many drinks containing alcohol do you have on a typical day when you are drinking? 0  3. How often do you have six or more drinks on one occasion? 0  AUDIT-C Score 1  4. How often during the last year have you found that you were not able to stop drinking once you had started? -  5. How often during the last year have you failed to do what was normally expected from you because of drinking? -  6. How often during the last year have you needed a first drink in the morning to get yourself going after a heavy drinking session? -  7. How often during the last year have you had a feeling of guilt of remorse after drinking? -  8. How often during the last year have you been unable to remember what happened the night before because you had been drinking? -  9. Have you or someone else been injured as a result of your drinking? -  10. Has a relative or friend or a doctor or another health worker been concerned about your drinking or suggested you cut down? -  Alcohol Use Disorder Identification Test Final Score (AUDIT) -  Alcohol Brief Interventions/Follow-up -   A score of 3 or more in women, and 4 or more in men indicates increased risk for alcohol abuse, EXCEPT if all of the points are from question 1     Assessment & Plan     Annual wellness visit done today including the all of the following: Reviewed patient's Family Medical History Reviewed and updated list of patient's medical providers Assessment of cognitive impairment was done Assessed patient's functional ability Established a written schedule for health screening [place] Completed and Reviewed  Exercise Activities and Dietary recommendations  Goals      DIET - REDUCE SUGAR INTAKE     Recommend to cut back on sugar and sweets in daily diet and subsitute for healthier  snack.      Exercise 3x per week (30 min per time)     Recommend to exercise for 3 days a week for at least 30 minutes at a time.      LIFESTYLE - DECREASE FALLS RISK     Recommend to remove any items from the home that may cause slips or trips.        Immunization History  Administered Date(s) Administered   Fluad Quad(high Dose 65+) 03/27/2021   Influenza, High Dose Seasonal PF 03/18/2016, 03/27/2017, 03/29/2018, 04/05/2019, 04/10/2020   Influenza,inj,Quad PF,6+ Mos 05/17/2015   PFIZER(Purple Top)SARS-COV-2 Vaccination 07/14/2019, 08/04/2019, 04/19/2020   Pneumococcal Conjugate-13 05/20/2016  Pneumococcal Polysaccharide-23 04/12/2008, 09/12/2013   Tdap 05/03/2007   Zoster Recombinat (Shingrix) 05/18/2019, 08/24/2019   Zoster, Live 06/12/2009    Health Maintenance  Topic Date Due   TETANUS/TDAP  05/02/2017   FOOT EXAM  01/10/2020   COVID-19 Vaccine (4 - Booster for Pfizer series) 08/20/2020   OPHTHALMOLOGY EXAM  08/23/2020   HEMOGLOBIN A1C  08/07/2021   URINE MICROALBUMIN  03/27/2022   DEXA SCAN  02/04/2023   INFLUENZA VACCINE  Completed   Zoster Vaccines- Shingrix  Completed   HPV VACCINES  Aged Out     Discussed health benefits of physical activity, and encouraged her to engage in regular exercise appropriate for her age and condition.        The entirety of the information documented in the History of Present Illness, Review of Systems and Physical Exam were personally obtained by me. Portions of this information were initially documented by the CMA and reviewed by me for thoroughness and accuracy.     Lelon Huh, MD  [facility] [phone] (phone) [phone] (fax)  [place]

## 2021-03-29 NOTE — Addendum Note (Signed)
Addended by: Birdie Sons on: 03/29/2021 12:35 PM   Modules accepted: Orders

## 2021-03-29 NOTE — Telephone Encounter (Signed)
Copied from [place] [id number]. Topic: General - Inquiry >> Mar 29, 2021  9:56 AM Greggory Keen D wrote: Reason for CRM: Pt called asking if Dr. Caryn Section was going to call anything in for her UC that came back from her visit on the 21st.  CB#  [phone]

## 2021-04-01 LAB — URINE CULTURE

## 2021-04-01 LAB — SPECIMEN STATUS REPORT

## 2021-04-10 ENCOUNTER — Other Ambulatory Visit: Payer: Self-pay | Admitting: Family Medicine

## 2021-04-10 DIAGNOSIS — E039 Hypothyroidism, unspecified: Secondary | ICD-10-CM

## 2021-04-24 ENCOUNTER — Other Ambulatory Visit: Payer: Self-pay | Admitting: Family Medicine

## 2021-04-24 DIAGNOSIS — J309 Allergic rhinitis, unspecified: Secondary | ICD-10-CM

## 2021-04-24 DIAGNOSIS — H68003 Unspecified Eustachian salpingitis, bilateral: Secondary | ICD-10-CM

## 2021-04-24 NOTE — Telephone Encounter (Signed)
Requested medication (s) are due for refill today: na  Requested medication (s) are on the active medication list: no  Last refill:  03/23/21  Future visit scheduled: yes in 2 months  Notes to clinic: clarify is EQ allergy can substitute for Claritin?     Requested Prescriptions  Pending Prescriptions Disp Refills   EQ ALLERGY RELIEF 10 MG tablet [Pharmacy Med Name: EQ Allergy Relief 10 MG Oral Tablet] 30 tablet 0    Sig: Take 1 tablet by mouth once daily     Ear, Nose, and Throat:  Antihistamines Passed - 04/24/2021 10:42 AM      Passed - Valid encounter within last 12 months    Recent Outpatient Visits           4 weeks ago Annual physical exam   [facility] Birdie Sons, MD   1 month ago Salpingitis of both eustachian tubes   [facility] Jerrol Banana., MD   2 months ago Type 2 diabetes mellitus with vascular disease [facility])   [facility] Birdie Sons, MD   6 months ago Strain of left ankle, initial encounter   [facility] Birdie Sons, MD   7 months ago Acute otitis media, unspecified otitis media type   [facility] Flinchum, Kelby Aline, FNP       Future Appointments             In 2 months Fisher, Kirstie Peri, MD [facility], [place]

## 2021-05-21 ENCOUNTER — Other Ambulatory Visit: Payer: Self-pay | Admitting: Family Medicine

## 2021-05-21 DIAGNOSIS — I10 Essential (primary) hypertension: Secondary | ICD-10-CM

## 2021-05-28 ENCOUNTER — Other Ambulatory Visit: Payer: Self-pay | Admitting: Family Medicine

## 2021-05-28 DIAGNOSIS — J45909 Unspecified asthma, uncomplicated: Secondary | ICD-10-CM

## 2021-05-28 DIAGNOSIS — J309 Allergic rhinitis, unspecified: Secondary | ICD-10-CM

## 2021-05-29 NOTE — Telephone Encounter (Signed)
Requested Prescriptions  Pending Prescriptions Disp Refills  . levocetirizine (XYZAL) 5 MG tablet [Pharmacy Med Name: Levocetirizine Dihydrochloride 5 MG Oral Tablet] 90 tablet 1    Sig: TAKE 1 TABLET BY MOUTH ONCE DAILY IN THE EVENING     Ear, Nose, and Throat:  Antihistamines Passed - 05/28/2021  6:58 PM      Passed - Valid encounter within last 12 months    Recent Outpatient Visits          2 months ago Annual physical exam   [facility] Birdie Sons, MD   3 months ago Salpingitis of both eustachian tubes   [facility] Jerrol Banana., MD   3 months ago Type 2 diabetes mellitus with vascular disease [facility])   [facility] Birdie Sons, MD   7 months ago Strain of left ankle, initial encounter   [facility] Birdie Sons, MD   8 months ago Acute otitis media, unspecified otitis media type   [facility] Flinchum, Kelby Aline, FNP      Future Appointments            In 1 month Fisher, Kirstie Peri, MD [facility], [place]

## 2021-06-04 DIAGNOSIS — I10 Essential (primary) hypertension: Secondary | ICD-10-CM | POA: Diagnosis not present

## 2021-06-04 DIAGNOSIS — Z95818 Presence of other cardiac implants and grafts: Secondary | ICD-10-CM | POA: Diagnosis not present

## 2021-06-04 DIAGNOSIS — E7800 Pure hypercholesterolemia, unspecified: Secondary | ICD-10-CM | POA: Diagnosis not present

## 2021-06-04 DIAGNOSIS — I639 Cerebral infarction, unspecified: Secondary | ICD-10-CM | POA: Diagnosis not present

## 2021-06-04 DIAGNOSIS — I480 Paroxysmal atrial fibrillation: Secondary | ICD-10-CM | POA: Diagnosis not present

## 2021-06-04 DIAGNOSIS — E1159 Type 2 diabetes mellitus with other circulatory complications: Secondary | ICD-10-CM | POA: Diagnosis not present

## 2021-07-03 ENCOUNTER — Other Ambulatory Visit: Payer: Self-pay

## 2021-07-03 ENCOUNTER — Encounter: Payer: Self-pay | Admitting: Family Medicine

## 2021-07-03 ENCOUNTER — Ambulatory Visit (INDEPENDENT_AMBULATORY_CARE_PROVIDER_SITE_OTHER): Payer: PPO | Admitting: Family Medicine

## 2021-07-03 VITALS — BP 119/67 | HR 87 | Temp 98.6°F | Resp 18 | Wt 159.0 lb

## 2021-07-03 DIAGNOSIS — E1159 Type 2 diabetes mellitus with other circulatory complications: Secondary | ICD-10-CM | POA: Diagnosis not present

## 2021-07-03 DIAGNOSIS — E782 Mixed hyperlipidemia: Secondary | ICD-10-CM | POA: Diagnosis not present

## 2021-07-03 DIAGNOSIS — N390 Urinary tract infection, site not specified: Secondary | ICD-10-CM

## 2021-07-03 DIAGNOSIS — E039 Hypothyroidism, unspecified: Secondary | ICD-10-CM

## 2021-07-03 DIAGNOSIS — I10 Essential (primary) hypertension: Secondary | ICD-10-CM | POA: Diagnosis not present

## 2021-07-03 LAB — POCT URINALYSIS DIPSTICK
Bilirubin, UA: NEGATIVE
Glucose, UA: NEGATIVE
Ketones, UA: NEGATIVE
Nitrite, UA: NEGATIVE
Protein, UA: NEGATIVE
Spec Grav, UA: 1.015 (ref 1.010–1.025)
Urobilinogen, UA: 0.2 E.U./dL
pH, UA: 6 (ref 5.0–8.0)

## 2021-07-03 NOTE — Progress Notes (Signed)
Established patient visit   Patient: Destiny Gilbert   DOB: February 22, 1941   80 y.o. Female  MRN: [id number] Visit Date: 07/03/2021  Today's healthcare provider: Lelon Huh, MD   Chief Complaint  Patient presents with   Diabetes   Hyperlipidemia   Hypothyroidism   Subjective    HPI  Diabetes Mellitus Type II, Follow-up  Lab Results  Component Value Date   HGBA1C 6.1 (A) 02/04/2021   HGBA1C 6.5 (A) 08/01/2020   HGBA1C 6.3 (H) 01/23/2020   Wt Readings from Last 3 Encounters:  07/03/21 159 lb (72.1 kg)  03/27/21 162 lb (73.5 kg)  02/26/21 163 lb (73.9 kg)   Last seen for diabetes 3 months ago.  Management since then includes continue same medication. She reports good compliance with treatment. She is not having side effects.  Symptoms: No fatigue No foot ulcerations  No appetite changes No nausea  No paresthesia of the feet  No polydipsia  No polyuria No visual disturbances   No vomiting     Home blood sugar records: fasting range: 137-142  Episodes of hypoglycemia? No    Current insulin regiment: none Most Recent Eye Exam: not UTD Current exercise: none Current diet habits: in general, an "unhealthy" diet    ---------------------------------------------------------------------------------------------------   Lipid/Cholesterol, Follow-up  Last lipid panel Other pertinent labs  Lab Results  Component Value Date   CHOL 165 01/23/2020   HDL 58 01/23/2020   LDLCALC 87 01/23/2020   TRIG 113 01/23/2020   CHOLHDL 2.8 01/23/2020   Lab Results  Component Value Date   ALT 15 02/04/2021   AST 29 02/04/2021   PLT 303 02/04/2021   TSH 1.140 02/04/2021     She was last seen for this 3 months ago.  Management since that visit includes continuing same medication.  She reports good compliance with treatment. She is not having side effects.   Symptoms: No chest pain No chest pressure/discomfort  No dyspnea No lower extremity edema  No numbness or  tingling of extremity No orthopnea  No palpitations No paroxysmal nocturnal dyspnea  No speech difficulty No syncope   Current diet: in general, an "unhealthy" diet Current exercise: none  The ASCVD Risk score (Arnett DK, et al., 2019) failed to calculate for the following reasons:   The patient has a prior MI or stroke diagnosis  ---------------------------------------------------------------------------------------------------   Hypothyroid, follow-up  Lab Results  Component Value Date   TSH 1.140 02/04/2021   TSH 2.420 01/23/2020   TSH 1.570 01/12/2019   FREET4 1.22 02/04/2021   FREET4 1.06 01/23/2020    Wt Readings from Last 3 Encounters:  07/03/21 159 lb (72.1 kg)  03/27/21 162 lb (73.5 kg)  02/26/21 163 lb (73.9 kg)    She was last seen for hypothyroid 3 months ago.  Management since that visit includes continuing same medication. She reports good compliance with treatment. She is not having side effects.   Symptoms: No change in energy level No constipation  No diarrhea No heat / cold intolerance  No nervousness No palpitations  No weight changes    -----------------------------------------------------------------------------------------   Medications: Outpatient Medications Prior to Visit  Medication Sig   acetaminophen (TYLENOL) 500 MG tablet Take 500 mg by mouth every 6 (six) hours as needed.   ADVAIR DISKUS 250-50 MCG/DOSE AEPB INHALE 1 DOSE BY MOUTH TWICE DAILY (Patient taking differently: Inhale 1 puff into the lungs daily.)   albuterol (PROAIR HFA) 108 (90 Base) MCG/ACT inhaler Inhale 2  puffs into the lungs every 6 (six) hours as needed.   amLODipine (NORVASC) 2.5 MG tablet Take 1 tablet by mouth once daily   atorvastatin (LIPITOR) 40 MG tablet Take 1 tablet (40 mg total) by mouth daily.   ELIQUIS 5 MG TABS tablet Take 5 mg by mouth every 12 (twelve) hours.   levothyroxine (SYNTHROID) 50 MCG tablet TAKE 1 TABLET BY MOUTH ONCE DAILY BEFORE BREAKFAST    metFORMIN (GLUCOPHAGE-XR) 500 MG 24 hr tablet TAKE 1 TABLET BY MOUTH ONCE DAILY FOR BLOOD SUGAR   nitroGLYCERIN (NITROSTAT) 0.3 MG SL tablet Place 1 tablet (0.3 mg total) under the tongue every 5 (five) minutes as needed for chest pain.   ONETOUCH ULTRA test strip USE 1 STRIP TO CHECK GLUCOSE ONCE DAILY   potassium chloride (KLOR-CON) 10 MEQ tablet Take 1 tablet by mouth twice daily   PROCTO-MED HC 2.5 % rectal cream APPLY CREAM RECTALLY TO AFFECTED AREA TWICE DAILY   triamterene-hydrochlorothiazide (MAXZIDE) 75-50 MG tablet Take 1 tablet by mouth daily.   guaiFENesin (MUCUS RELIEF PO) Take by mouth. (Patient not taking: Reported on 07/03/2021)   levocetirizine (XYZAL) 5 MG tablet TAKE 1 TABLET BY MOUTH ONCE DAILY IN THE EVENING (Patient not taking: Reported on 07/03/2021)   loratadine (EQ ALLERGY RELIEF) 10 MG tablet Take 1 tablet by mouth once daily (Patient not taking: Reported on 07/03/2021)   No facility-administered medications prior to visit.    Review of Systems  Constitutional:  Negative for appetite change, chills, fatigue and fever.  Respiratory:  Negative for chest tightness and shortness of breath.   Cardiovascular:  Negative for chest pain and palpitations.  Gastrointestinal:  Negative for abdominal pain, nausea and vomiting.  Neurological:  Negative for dizziness and weakness.      Objective    BP 119/67 (BP Location: Left Arm, Patient Position: Sitting, Cuff Size: Large)    Pulse 87    Temp 98.6 F (37 C) (Oral)    Resp 18    Wt 159 lb (72.1 kg)    SpO2 98% Comment: room air   BMI 29.08 kg/m  {Show previous vital signs (optional):23777}  Physical Exam    General: Appearance:     Well developed, well nourished female in no acute distress  Eyes:    PERRL, conjunctiva/corneas clear, EOM's intact       Lungs:     Clear to auscultation bilaterally, respirations unlabored  Heart:    Normal heart rate. Regular rhythm. No murmurs, rubs, or gallops.    MS:   All extremities  are intact.    Neurologic:   Awake, alert, oriented x 3. No apparent focal neurological defect.           Assessment & Plan     1. Essential (primary) hypertension Well controlled.  Continue current medications.   - Comprehensive metabolic panel - CBC  2. Type 2 diabetes mellitus with vascular disease ([place]) Doing well current medications.  - Hemoglobin A1c  3. Hypothyroidism, unspecified type  - TSH  4. Mixed hyperlipidemia She is tolerating atorvastatin well with no adverse effects.   - Lipid panel  5. Recurrent UTI Unable to supply urine sample today, but is not currently symptomatic.    Future Appointments  Date Time Provider [place]  11/04/2021  8:20 AM Caryn Section Kirstie Peri, MD [facility]        The entirety of the information documented in the History of Present Illness, Review of Systems and Physical  Exam were personally obtained by me. Portions of this information were initially documented by the CMA and reviewed by me for thoroughness and accuracy.     Lelon Huh, MD  [facility] [phone] (phone) [phone] (fax)  [place]

## 2021-07-03 NOTE — Patient Instructions (Signed)
.   Please review the attached list of medications and notify my office if there are any errors.   . Please bring all of your medications to every appointment so we can make sure that our medication list is the same as yours.   . Please contact your eyecare professional to schedule a routine eye exam    

## 2021-07-03 NOTE — Addendum Note (Signed)
Addended by: Randal Buba on: 07/03/2021 09:07 AM   Modules accepted: Orders

## 2021-07-04 LAB — COMPREHENSIVE METABOLIC PANEL
ALT: 16 IU/L (ref 0–32)
AST: 28 IU/L (ref 0–40)
Albumin/Globulin Ratio: 1.5 (ref 1.2–2.2)
Albumin: 4.1 g/dL (ref 3.7–4.7)
Alkaline Phosphatase: 97 IU/L (ref 44–121)
BUN/Creatinine Ratio: 13 (ref 12–28)
BUN: 18 mg/dL (ref 8–27)
Bilirubin Total: 0.4 mg/dL (ref 0.0–1.2)
CO2: 26 mmol/L (ref 20–29)
Calcium: 9.5 mg/dL (ref 8.7–10.3)
Chloride: 104 mmol/L (ref 96–106)
Creatinine, Ser: 1.35 mg/dL — ABNORMAL HIGH (ref 0.57–1.00)
Globulin, Total: 2.8 g/dL (ref 1.5–4.5)
Glucose: 105 mg/dL — ABNORMAL HIGH (ref 70–99)
Potassium: 4.4 mmol/L (ref 3.5–5.2)
Sodium: 142 mmol/L (ref 134–144)
Total Protein: 6.9 g/dL (ref 6.0–8.5)
eGFR: 40 mL/min/{1.73_m2} — ABNORMAL LOW (ref >59)

## 2021-07-04 LAB — CBC
Hematocrit: 37 % (ref 34.0–46.6)
Hemoglobin: 11.8 g/dL (ref 11.1–15.9)
MCH: 31.2 pg (ref 26.6–33.0)
MCHC: 31.9 g/dL (ref 31.5–35.7)
MCV: 98 fL — ABNORMAL HIGH (ref 79–97)
Platelets: 284 10*3/uL (ref 150–450)
RBC: 3.78 x10E6/uL (ref 3.77–5.28)
RDW: 11.9 % (ref 11.7–15.4)
WBC: 5.9 10*3/uL (ref 3.4–10.8)

## 2021-07-04 LAB — LIPID PANEL
Chol/HDL Ratio: 3 ratio (ref 0.0–4.4)
Cholesterol, Total: 180 mg/dL (ref 100–199)
HDL: 61 mg/dL (ref >39)
LDL Chol Calc (NIH): 97 mg/dL (ref 0–99)
Triglycerides: 127 mg/dL (ref 0–149)
VLDL Cholesterol Cal: 22 mg/dL (ref 5–40)

## 2021-07-04 LAB — HEMOGLOBIN A1C
Est. average glucose Bld gHb Est-mCnc: 131 mg/dL
Hgb A1c MFr Bld: 6.2 % — ABNORMAL HIGH (ref 4.8–5.6)

## 2021-07-04 LAB — TSH: TSH: 3.03 u[IU]/mL (ref 0.450–4.500)

## 2021-07-06 LAB — URINE CULTURE

## 2021-07-25 ENCOUNTER — Telehealth: Payer: Self-pay | Admitting: Family Medicine

## 2021-07-25 DIAGNOSIS — E039 Hypothyroidism, unspecified: Secondary | ICD-10-CM

## 2021-07-25 NOTE — Telephone Encounter (Signed)
[facility] faxed refill request for the following medications:  EUTHYROX 50 MCG tablet   Please advise.

## 2021-07-26 MED ORDER — LEVOTHYROXINE SODIUM 50 MCG PO TABS: 50.0000 ug | ORAL_TABLET | Freq: Every day | ORAL | 1 refills | Status: DC

## 2021-07-29 ENCOUNTER — Other Ambulatory Visit: Payer: Self-pay | Admitting: Family Medicine

## 2021-07-29 DIAGNOSIS — Z1231 Encounter for screening mammogram for malignant neoplasm of breast: Secondary | ICD-10-CM

## 2021-08-05 ENCOUNTER — Other Ambulatory Visit: Payer: Self-pay | Admitting: Family Medicine

## 2021-08-30 ENCOUNTER — Other Ambulatory Visit: Payer: Self-pay

## 2021-08-30 ENCOUNTER — Ambulatory Visit
Admission: RE | Admit: 2021-08-30 | Discharge: 2021-08-30 | Disposition: A | Discharge status: Home / Self Care | Payer: PPO | Source: Ambulatory Visit | Attending: Family Medicine | Admitting: Family Medicine

## 2021-08-30 DIAGNOSIS — Z1231 Encounter for screening mammogram for malignant neoplasm of breast: Secondary | ICD-10-CM | POA: Diagnosis not present

## 2021-09-08 IMAGING — MG MM DIGITAL SCREENING BILAT W/ TOMO AND CAD
8 series · 8 of 24 positions shown · non-contrast
Comparison: Previous exam(s).

ACR Breast Density Category a: The breast tissue is almost entirely
fatty.

CLINICAL DATA: Screening.

EXAM:
DIGITAL SCREENING BILATERAL MAMMOGRAM WITH TOMOSYNTHESIS AND CAD
TECHNIQUE: Bilateral screening digital craniocaudal and mediolateral oblique
mammograms were obtained. Bilateral screening digital breast
tomosynthesis was performed. The images were evaluated with
computer-aided detection.

[R MLO synth-2D]
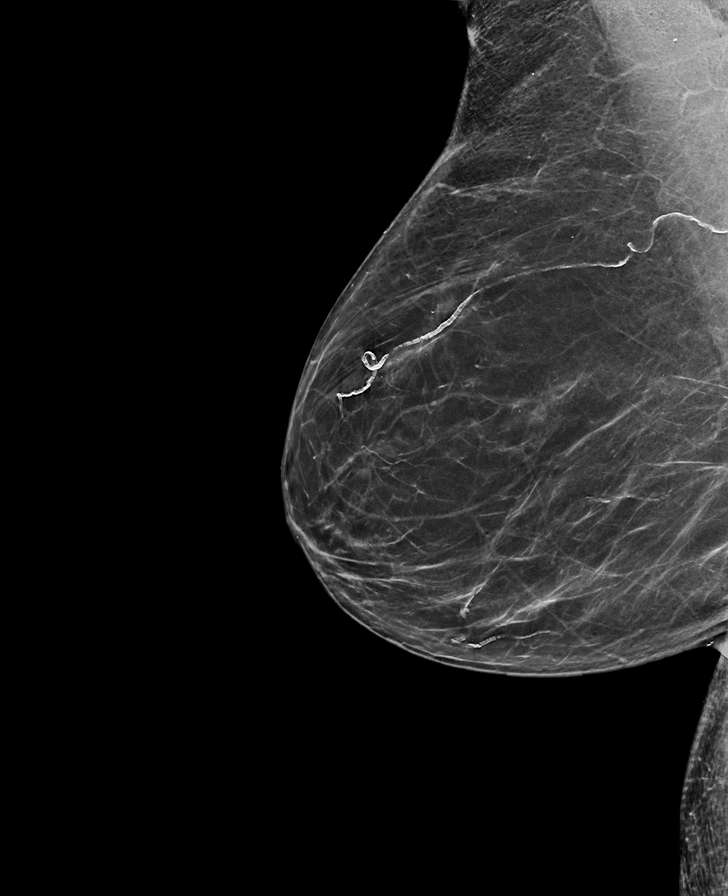

[R CC synth-2D]
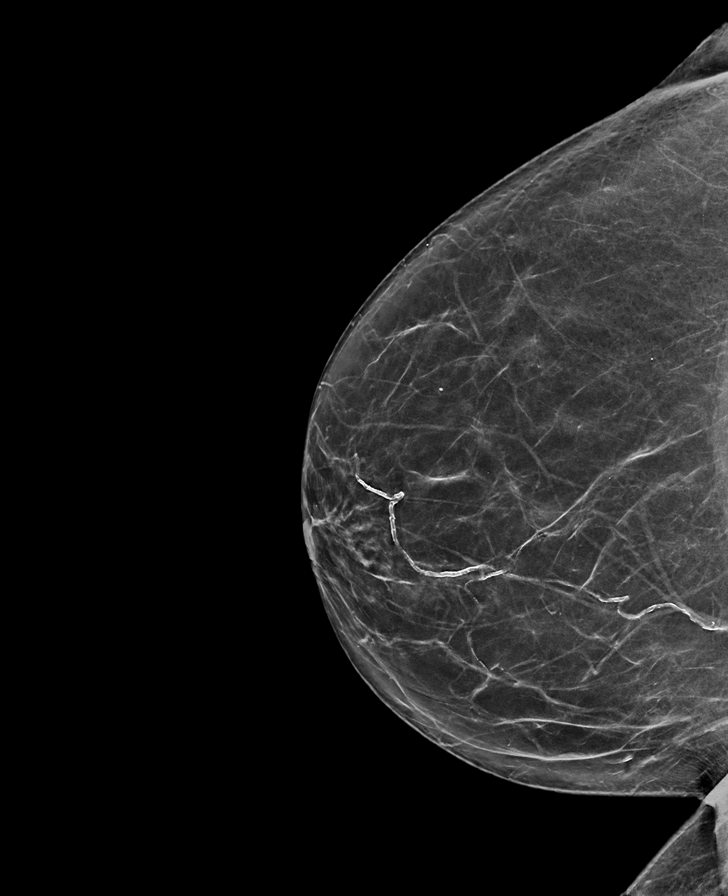

[L CC synth-2D]
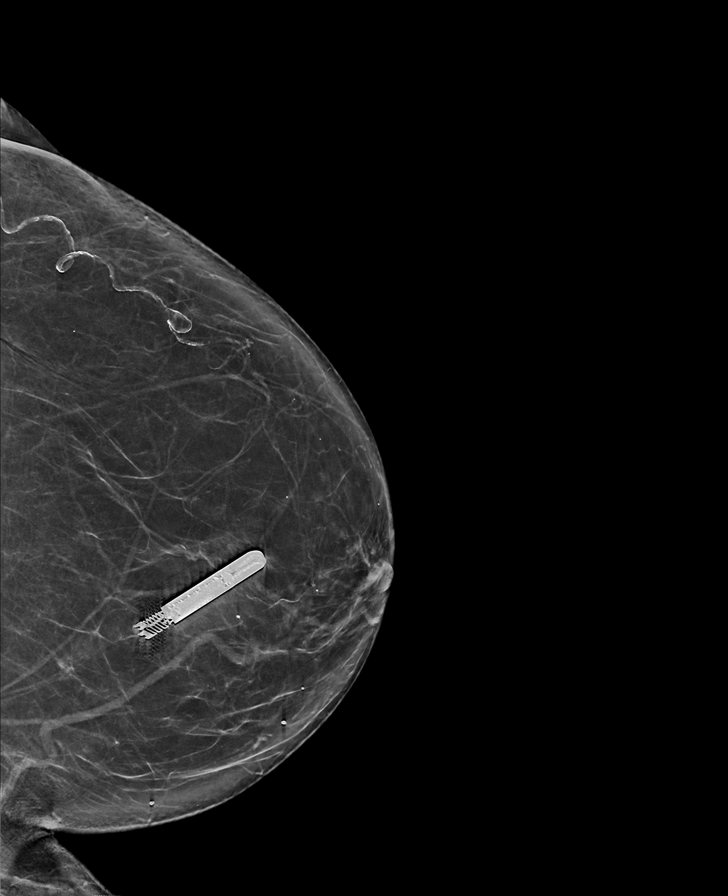

[L MLO synth-2D]
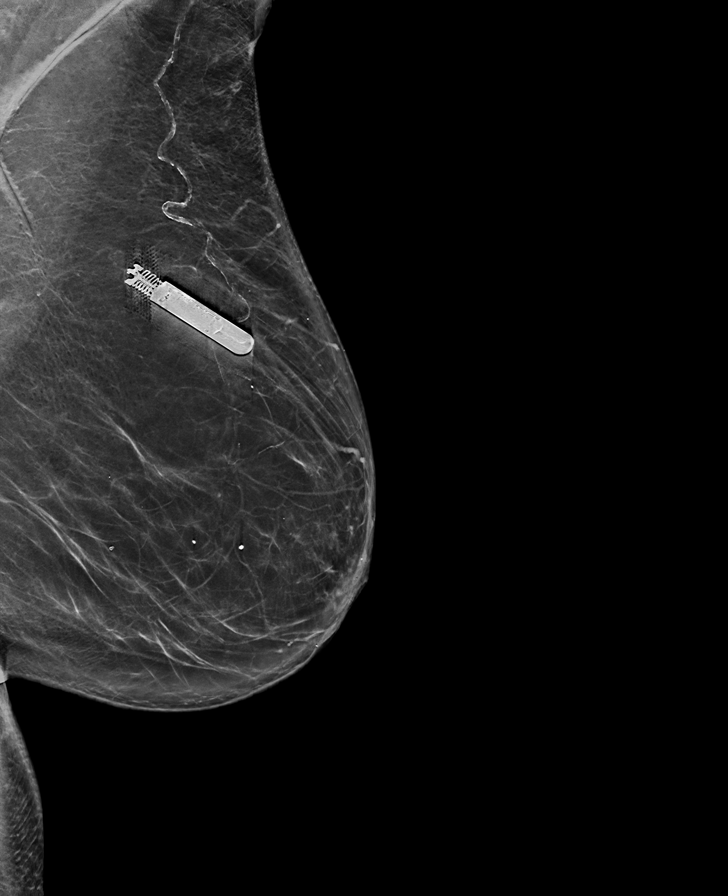

[L MLO tomo · tomo slice 41/80.0]
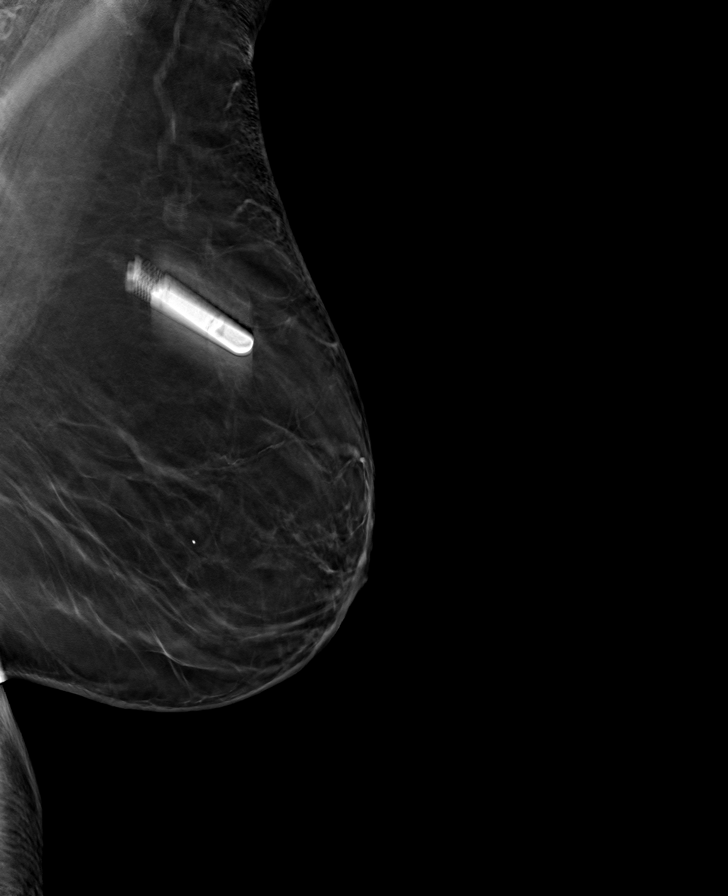

[R CC tomo · tomo slice 33/66.0]
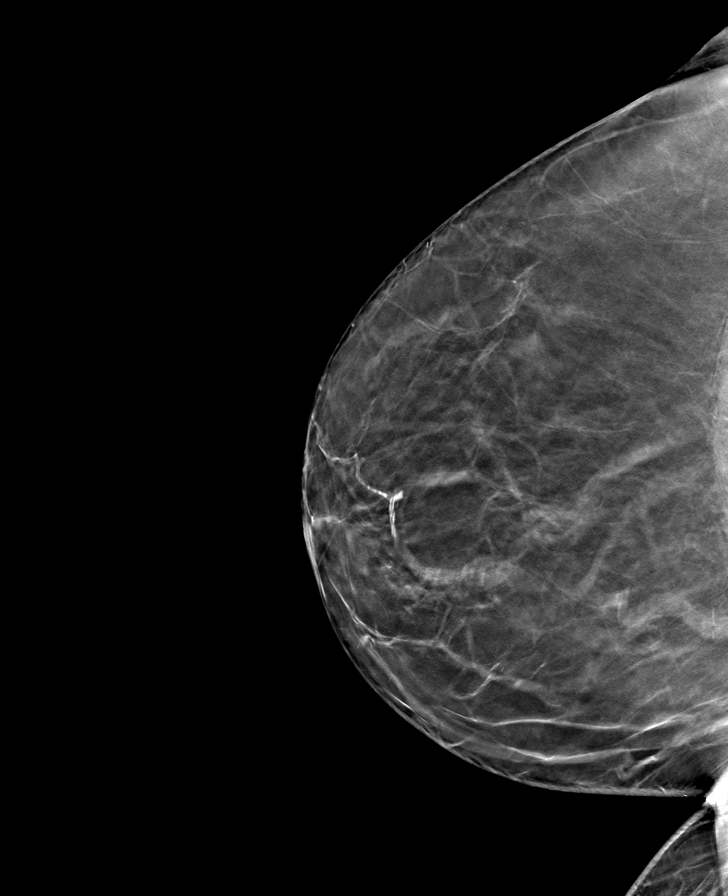

[R MLO tomo · tomo slice 37/73.0]
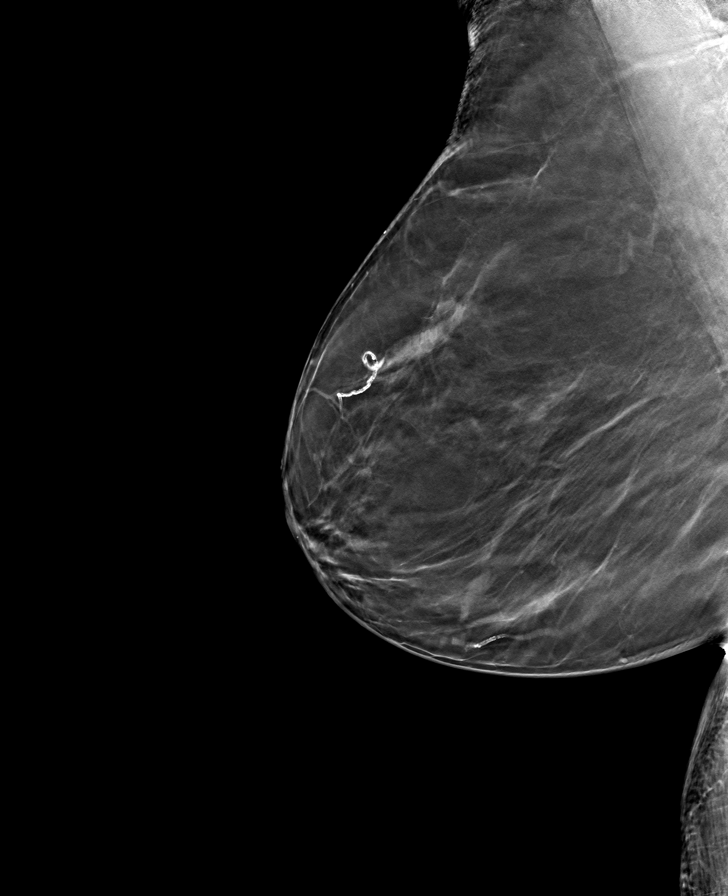

[L CC tomo · tomo slice 37/73.0]
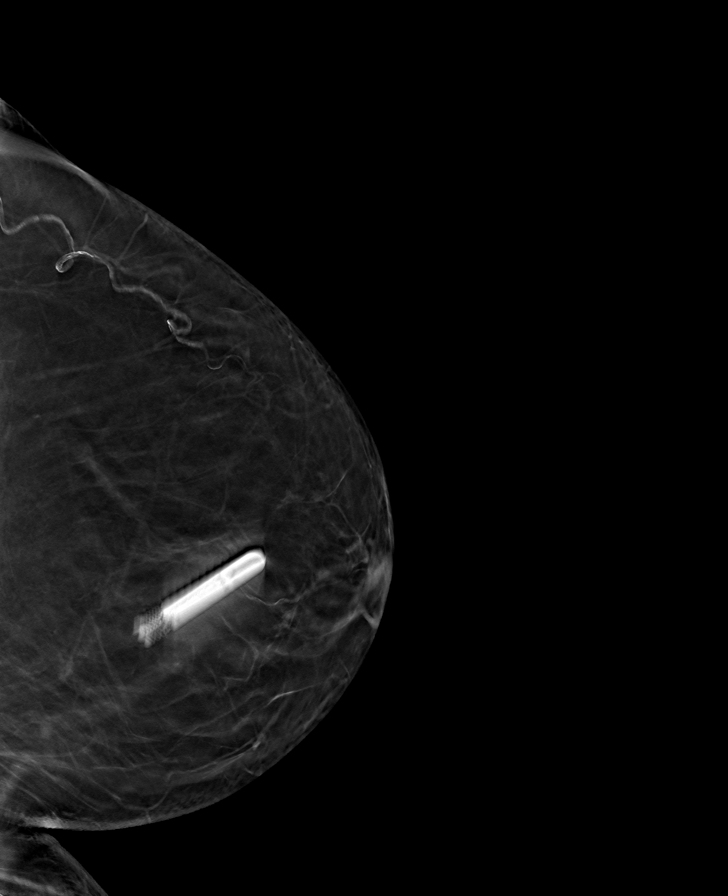

[8 of 24 positions shown; findings below may reference images not displayed]

FINDINGS: There are no findings suspicious for malignancy. LEFT chest loop
recorder.
IMPRESSION: No mammographic evidence of malignancy. A result letter of this
screening mammogram will be mailed directly to the patient.

RECOMMENDATION:
Screening mammogram in one year. (Code:09-1-S22)

BI-RADS CATEGORY  1: Negative.

## 2021-09-16 ENCOUNTER — Other Ambulatory Visit: Payer: Self-pay | Admitting: Family Medicine

## 2021-10-03 DIAGNOSIS — E119 Type 2 diabetes mellitus without complications: Secondary | ICD-10-CM | POA: Diagnosis not present

## 2021-10-03 LAB — HM DIABETES EYE EXAM

## 2021-10-11 ENCOUNTER — Telehealth: Payer: Self-pay

## 2021-10-11 DIAGNOSIS — J45909 Unspecified asthma, uncomplicated: Secondary | ICD-10-CM

## 2021-10-11 MED ORDER — FLUTICASONE-SALMETEROL 250-50 MCG/ACT IN AEPB: 1.0000 | INHALATION_SPRAY | Freq: Two times a day (BID) | RESPIRATORY_TRACT | 3 refills | Status: DC

## 2021-10-11 NOTE — Addendum Note (Signed)
Addended by: Lelon Huh E on: 10/11/2021 02:05 PM ? ? Modules accepted: Orders ? ?

## 2021-10-11 NOTE — Telephone Encounter (Signed)
Pt called stating that she needed an alternate prescription for Advair Diskus inhaler due to her insurance will not cover prescription. The cost for the pt is 100 dollars. Sent medication to [facility] on [address]. ?

## 2021-10-21 ENCOUNTER — Other Ambulatory Visit: Payer: Self-pay | Admitting: Family Medicine

## 2021-10-21 DIAGNOSIS — I10 Essential (primary) hypertension: Secondary | ICD-10-CM

## 2021-11-04 ENCOUNTER — Ambulatory Visit (INDEPENDENT_AMBULATORY_CARE_PROVIDER_SITE_OTHER): Payer: PPO | Admitting: Family Medicine

## 2021-11-04 ENCOUNTER — Encounter: Payer: Self-pay | Admitting: Family Medicine

## 2021-11-04 VITALS — BP 116/61 | HR 79 | Temp 98.0°F | Resp 16 | Wt 162.3 lb

## 2021-11-04 DIAGNOSIS — E039 Hypothyroidism, unspecified: Secondary | ICD-10-CM | POA: Diagnosis not present

## 2021-11-04 DIAGNOSIS — E1159 Type 2 diabetes mellitus with other circulatory complications: Secondary | ICD-10-CM

## 2021-11-04 DIAGNOSIS — I10 Essential (primary) hypertension: Secondary | ICD-10-CM

## 2021-11-04 DIAGNOSIS — E119 Type 2 diabetes mellitus without complications: Secondary | ICD-10-CM | POA: Diagnosis not present

## 2021-11-04 NOTE — Progress Notes (Signed)
?  ? ?I,Jana Robinson,acting as a scribe for Lelon Huh, MD.,have documented all relevant documentation on the behalf of Lelon Huh, MD,as directed by  Lelon Huh, MD while in the presence of Lelon Huh, MD. ? ? ?Established patient visit ? ? ?Patient: Destiny Gilbert   DOB: Nov 20, 1940   81 y.o. Female  MRN: [id number] ?Visit Date: 11/04/2021 ? ?Today's healthcare provider: Lelon Huh, MD  ? ?Chief Complaint  ?Patient presents with  ? Diabetes  ? Hypertension  ? Hypothyroidism  ? ?Subjective  ?  ?HPI  ?Diabetes Mellitus Type II, Follow-up ? ?Lab Results  ?Component Value Date  ? HGBA1C 6.2 (H) 07/03/2021  ? HGBA1C 6.1 (A) 02/04/2021  ? HGBA1C 6.5 (A) 08/01/2020  ? ?Wt Readings from Last 3 Encounters:  ?11/04/21 162 lb 4.8 oz (73.6 kg)  ?07/03/21 159 lb (72.1 kg)  ?03/27/21 162 lb (73.5 kg)  ? ?Last seen for diabetes 4 months ago.  ?Management since then includes continue same medication. ?She reports excellent compliance w ?Symptoms: ?No fatigue No foot ulcerations  ?No appetite changes No nausea  ?No paresthesia of the feet  No polydipsia  ?No polyuria No visual disturbances   ?No vomiting   ? ? ?Home blood sugar records: fasting range: 108-114 ? ?Episodes of hypoglycemia? No   ?Current insulin regiment: none ?Most Recent Eye Exam: March '23  ?Current exercise: none ?Current diet habits: in general, a "healthy" diet   ? ?Pertinent Labs: ?Lab Results  ?Component Value Date  ? CHOL 180 07/03/2021  ? HDL 61 07/03/2021  ? [place] 97 07/03/2021  ? TRIG 127 07/03/2021  ? CHOLHDL 3.0 07/03/2021  ? Lab Results  ?Component Value Date  ? NA 142 07/03/2021  ? K 4.4 07/03/2021  ? CREATININE 1.35 (H) 07/03/2021  ? EGFR 40 (L) 07/03/2021  ? MICROALBUR 50 03/27/2021  ?  ? ?---------------------------------------------------------------------------------------------------  ? ?Hypertension, follow-up ? ?BP Readings from Last 3 Encounters:  ?11/04/21 116/61  ?07/03/21 119/67  ?03/27/21 127/67  ? Wt Readings from Last 3  Encounters:  ?11/04/21 162 lb 4.8 oz (73.6 kg)  ?07/03/21 159 lb (72.1 kg)  ?03/27/21 162 lb (73.5 kg)  ?  ? ?She was last seen for hypertension 4 months ago.  ?BP at that visit was 119/67. Management since that visit includes continue same medication. ? ?She reports excellent compliance with treatment. ?She is not having side effects.  ?She is following a Regular diet. ?She is not exercising. ?She does not smoke. ? ?Use of agents associated with hypertension: thyroid hormones.  ? ?Outside blood pressures are: occasionally 137/74 at last reading ?Symptoms: ?No chest pain No chest pressure  ?No palpitations No syncope  ?No dyspnea No orthopnea  ?No paroxysmal nocturnal dyspnea No lower extremity edema  ? ?---------------------------------------------------------------------------------------------------  ? ?Hypothyroid, follow-up ? ?Lab Results  ?Component Value Date  ? TSH 3.030 07/03/2021  ? TSH 1.140 02/04/2021  ? TSH 2.420 01/23/2020  ? FREET4 1.22 02/04/2021  ? FREET4 1.06 01/23/2020  ? ? ?Wt Readings from Last 3 Encounters:  ?11/04/21 162 lb 4.8 oz (73.6 kg)  ?07/03/21 159 lb (72.1 kg)  ?03/27/21 162 lb (73.5 kg)  ? ? ?She was last seen for hypothyroid 4 months ago.  ?Management since that visit includes continuing same dose of Levothyroxine. ?She reports excellent compliance with treatment. ?She is not having side effects.  ? ?Symptoms: ?No change in energy level No constipation  ?No diarrhea No heat / cold intolerance  ?No nervousness No palpitations  ?  No weight changes   ? ?-----------------------------------------------------------------------------------------  ? ?Medications: ?Outpatient Medications Prior to Visit  ?Medication Sig  ? acetaminophen (TYLENOL) 500 MG tablet Take 500 mg by mouth every 6 (six) hours as needed.  ? albuterol (PROAIR HFA) 108 (90 Base) MCG/ACT inhaler Inhale 2 puffs into the lungs every 6 (six) hours as needed.  ? amLODipine (NORVASC) 2.5 MG tablet Take 1 tablet by mouth once  daily  ? atorvastatin (LIPITOR) 40 MG tablet Take 1 tablet (40 mg total) by mouth daily.  ? ELIQUIS 5 MG TABS tablet Take 5 mg by mouth every 12 (twelve) hours.  ? fluticasone-salmeterol (ADVAIR) 250-50 MCG/ACT AEPB Inhale 1 puff into the lungs 2 (two) times daily.  ? guaiFENesin (MUCUS RELIEF PO) Take by mouth.  ? levocetirizine (XYZAL) 5 MG tablet TAKE 1 TABLET BY MOUTH ONCE DAILY IN THE EVENING  ? levothyroxine (SYNTHROID) 50 MCG tablet Take 1 tablet (50 mcg total) by mouth daily before breakfast.  ? loratadine (EQ ALLERGY RELIEF) 10 MG tablet Take 1 tablet by mouth once daily  ? metFORMIN (GLUCOPHAGE-XR) 500 MG 24 hr tablet TAKE 1 TABLET BY MOUTH ONCE DAILY FOR BLOOD SUGAR  ? nitroGLYCERIN (NITROSTAT) 0.3 MG SL tablet Place 1 tablet (0.3 mg total) under the tongue every 5 (five) minutes as needed for chest pain.  ? ONETOUCH ULTRA test strip USE 1 STRIP TO CHECK GLUCOSE ONCE DAILY  ? potassium chloride (KLOR-CON) 10 MEQ tablet Take 1 tablet by mouth twice daily  ? PROCTO-MED HC 2.5 % rectal cream APPLY CREAM RECTALLY TO AFFECTED AREA TWICE DAILY  ? triamterene-hydrochlorothiazide (MAXZIDE) 75-50 MG tablet Take 1 tablet by mouth once daily  ? ?No facility-administered medications prior to visit.  ? ? ? ? ?  Objective  ?  ?BP 116/61 (BP Location: Right Arm, Patient Position: Sitting, Cuff Size: Normal)   Pulse 79   Temp 98 ?F (36.7 ?C) (Oral)   Resp 16   Wt 162 lb 4.8 oz (73.6 kg)   SpO2 99%   BMI 29.69 kg/m?  ? ? ?Physical Exam  ? ?General: Appearance:     ?Well developed, well nourished female in no acute distress  ?Eyes:    PERRL, conjunctiva/corneas clear, EOM's intact       ?Lungs:     Clear to auscultation bilaterally, respirations unlabored  ?Heart:    Normal heart rate. Regular rhythm. No murmurs, rubs, or gallops.    ?MS:   All extremities are intact.    ?Neurologic:   Awake, alert, oriented x 3. No apparent focal neurological defect.   ?   ?  ? Assessment & Plan  ?  ? ?1. Type 2 diabetes mellitus  with vascular disease ([place]) ?Doing well on current medications.  ?- Urine Albumin-Creatinine with uACR ?- CBC ?- Comprehensive metabolic panel ?- Hemoglobin A1c ? ?2. Essential (primary) hypertension ?Well controlled.  Continue current medications.   ? ?3. Hypothyroidism, unspecified type ? ?- TSH ?- T4, free  ?   ? ?The entirety of the information documented in the History of Present Illness, Review of Systems and Physical Exam were personally obtained by me. Portions of this information were initially documented by the CMA and reviewed by me for thoroughness and accuracy.   ? ? ?Lelon Huh, MD  ?[facility] ?[phone] (phone) ?[phone] (fax) ? ?[facility]  ?

## 2021-11-05 LAB — COMPREHENSIVE METABOLIC PANEL
ALT: 21 IU/L (ref 0–32)
AST: 34 IU/L (ref 0–40)
Albumin/Globulin Ratio: 1.6 (ref 1.2–2.2)
Albumin: 4.4 g/dL (ref 3.7–4.7)
Alkaline Phosphatase: 99 IU/L (ref 44–121)
BUN/Creatinine Ratio: 15 (ref 12–28)
BUN: 18 mg/dL (ref 8–27)
Bilirubin Total: 0.5 mg/dL (ref 0.0–1.2)
CO2: 24 mmol/L (ref 20–29)
Calcium: 9.9 mg/dL (ref 8.7–10.3)
Chloride: 103 mmol/L (ref 96–106)
Creatinine, Ser: 1.22 mg/dL — ABNORMAL HIGH (ref 0.57–1.00)
Globulin, Total: 2.7 g/dL (ref 1.5–4.5)
Glucose: 110 mg/dL — ABNORMAL HIGH (ref 70–99)
Potassium: 4.4 mmol/L (ref 3.5–5.2)
Sodium: 142 mmol/L (ref 134–144)
Total Protein: 7.1 g/dL (ref 6.0–8.5)
eGFR: 45 mL/min/{1.73_m2} — ABNORMAL LOW (ref >59)

## 2021-11-05 LAB — CBC
Hematocrit: 37.2 % (ref 34.0–46.6)
Hemoglobin: 12.5 g/dL (ref 11.1–15.9)
MCH: 31.7 pg (ref 26.6–33.0)
MCHC: 33.6 g/dL (ref 31.5–35.7)
MCV: 94 fL (ref 79–97)
Platelets: 298 10*3/uL (ref 150–450)
RBC: 3.94 x10E6/uL (ref 3.77–5.28)
RDW: 11.8 % (ref 11.7–15.4)
WBC: 5.8 10*3/uL (ref 3.4–10.8)

## 2021-11-05 LAB — TSH: TSH: 3.65 u[IU]/mL (ref 0.450–4.500)

## 2021-11-05 LAB — MICROALBUMIN / CREATININE URINE RATIO
Creatinine, Urine: 42.2 mg/dL
Microalb/Creat Ratio: 19 mg/g creat (ref 0–29)
Microalbumin, Urine: 8.1 ug/mL

## 2021-11-05 LAB — HEMOGLOBIN A1C
Est. average glucose Bld gHb Est-mCnc: 131 mg/dL
Hgb A1c MFr Bld: 6.2 % — ABNORMAL HIGH (ref 4.8–5.6)

## 2021-11-05 LAB — T4, FREE: Free T4: 1.22 ng/dL (ref 0.82–1.77)

## 2021-11-22 ENCOUNTER — Other Ambulatory Visit: Payer: Self-pay | Admitting: Family Medicine

## 2021-11-22 DIAGNOSIS — J45909 Unspecified asthma, uncomplicated: Secondary | ICD-10-CM

## 2021-11-22 DIAGNOSIS — J309 Allergic rhinitis, unspecified: Secondary | ICD-10-CM

## 2021-12-03 DIAGNOSIS — I10 Essential (primary) hypertension: Secondary | ICD-10-CM | POA: Diagnosis not present

## 2021-12-03 DIAGNOSIS — E7800 Pure hypercholesterolemia, unspecified: Secondary | ICD-10-CM | POA: Diagnosis not present

## 2021-12-03 DIAGNOSIS — I63532 Cerebral infarction due to unspecified occlusion or stenosis of left posterior cerebral artery: Secondary | ICD-10-CM | POA: Diagnosis not present

## 2021-12-03 DIAGNOSIS — Z95818 Presence of other cardiac implants and grafts: Secondary | ICD-10-CM | POA: Diagnosis not present

## 2021-12-03 DIAGNOSIS — I480 Paroxysmal atrial fibrillation: Secondary | ICD-10-CM | POA: Diagnosis not present

## 2022-01-20 ENCOUNTER — Other Ambulatory Visit: Payer: Self-pay | Admitting: Family Medicine

## 2022-01-20 DIAGNOSIS — I10 Essential (primary) hypertension: Secondary | ICD-10-CM

## 2022-03-06 NOTE — Progress Notes (Unsigned)
I,Jana Robinson,acting as a scribe for Lelon Huh, MD.,have documented all relevant documentation on the behalf of Lelon Huh, MD,as directed by  Lelon Huh, MD while in the presence of Lelon Huh, MD.   Established patient visit   Patient: Destiny Gilbert   DOB: Nov 02, 1940   81 y.o. Female  MRN: [id number] Visit Date: 03/07/2022  Today's healthcare provider: Lelon Huh, MD   Chief Complaint  Patient presents with   Diabetes   Hypertension   Hypothyroidism   Subjective    Diabetes Mellitus Type II, Follow-up  Lab Results  Component Value Date   HGBA1C 6.2 (H) 11/04/2021   HGBA1C 6.2 (H) 07/03/2021   HGBA1C 6.1 (A) 02/04/2021   Wt Readings from Last 3 Encounters:  03/07/22 164 lb 3.2 oz (74.5 kg)  11/04/21 162 lb 4.8 oz (73.6 kg)  07/03/21 159 lb (72.1 kg)   Last seen for diabetes 4 months ago.  Management since then includes continue current medication. She reports excellent compliance with treatment. She is not having side effects.  Symptoms: yes fatigue No foot ulcerations  No appetite changes No nausea  No paresthesia of the feet  No polydipsia  No polyuria No visual disturbances   No vomiting     Home blood sugar records: fasting range: 129/179  Episodes of hypoglycemia? No    Current insulin regiment: Most Recent Eye Exam: 10-03-21 Current exercise: none Current diet habits: in general, an "unhealthy" diet  Pertinent Labs: Lab Results  Component Value Date   CHOL 180 07/03/2021   HDL 61 07/03/2021   LDLCALC 97 07/03/2021   TRIG 127 07/03/2021   CHOLHDL 3.0 07/03/2021   Lab Results  Component Value Date   NA 142 11/04/2021   K 4.4 11/04/2021   CREATININE 1.22 (H) 11/04/2021   EGFR 45 (L) 11/04/2021   MICROALBUR 50 03/27/2021   LABMICR 8.1 11/04/2021     --------------------------------------------------------------------------------------------------- Hypertension, follow-up  BP Readings from Last 3 Encounters:  03/07/22  133/61  11/04/21 116/61  07/03/21 119/67   Wt Readings from Last 3 Encounters:  03/07/22 164 lb 3.2 oz (74.5 kg)  11/04/21 162 lb 4.8 oz (73.6 kg)  07/03/21 159 lb (72.1 kg)     She was last seen for hypertension 4 months ago.  BP at that visit was 116/61. Management since that visit includes continue current medication.  She reports excellent compliance with treatment. She is not having side effects. She is following a Regular diet. She is not exercising. She does smoke.  Use of agents associated with hypertension:    Outside blood pressures are: occasionally / average 149/70  Symptoms: No chest pain No chest pressure  No palpitations No syncope  No dyspnea No orthopnea  No paroxysmal nocturnal dyspnea No lower extremity edema    ---------------------------------------------------------------------------------------------------  Hypothyroid, follow-up  Lab Results  Component Value Date   TSH 3.650 11/04/2021   TSH 3.030 07/03/2021   TSH 1.140 02/04/2021   FREET4 1.22 11/04/2021   FREET4 1.22 02/04/2021    Wt Readings from Last 3 Encounters:  03/07/22 164 lb 3.2 oz (74.5 kg)  11/04/21 162 lb 4.8 oz (73.6 kg)  07/03/21 159 lb (72.1 kg)    She was last seen for hypothyroid 4 months ago.  Management since that visit includes continue current medication. She reports excellent compliance with treatment. She is not having side effects.   Symptoms: Yes change in energy level No constipation  No diarrhea No heat / cold intolerance  No nervousness  No palpitations  No weight changes    -----------------------------------------------------------------------------------------  Patient reports allergy symptoms.  Requesting Rx. Was prescribed loratadine by Dr. Rosanna Randy which was effective. She also complains of feeling more fatigued and short of breath the last several weeks.  Medications: Outpatient Medications Prior to Visit  Medication Sig   acetaminophen (TYLENOL)  500 MG tablet Take 500 mg by mouth every 6 (six) hours as needed.   albuterol (PROAIR HFA) 108 (90 Base) MCG/ACT inhaler Inhale 2 puffs into the lungs every 6 (six) hours as needed.   amLODipine (NORVASC) 2.5 MG tablet Take 1 tablet by mouth once daily   atorvastatin (LIPITOR) 40 MG tablet Take 1 tablet (40 mg total) by mouth daily.   ELIQUIS 5 MG TABS tablet Take 5 mg by mouth every 12 (twelve) hours.   fluticasone-salmeterol (ADVAIR) 250-50 MCG/ACT AEPB Inhale 1 puff into the lungs 2 (two) times daily.   guaiFENesin (MUCUS RELIEF PO) Take by mouth.   levocetirizine (XYZAL) 5 MG tablet TAKE 1 TABLET BY MOUTH ONCE DAILY IN THE EVENING   levothyroxine (SYNTHROID) 50 MCG tablet Take 1 tablet (50 mcg total) by mouth daily before breakfast.   loratadine (EQ ALLERGY RELIEF) 10 MG tablet Take 1 tablet by mouth once daily   metFORMIN (GLUCOPHAGE-XR) 500 MG 24 hr tablet TAKE 1 TABLET BY MOUTH ONCE DAILY FOR BLOOD SUGAR   nitroGLYCERIN (NITROSTAT) 0.3 MG SL tablet Place 1 tablet (0.3 mg total) under the tongue every 5 (five) minutes as needed for chest pain.   ONETOUCH ULTRA test strip USE 1 STRIP TO CHECK GLUCOSE ONCE DAILY   potassium chloride (KLOR-CON) 10 MEQ tablet Take 1 tablet by mouth twice daily   PROCTO-MED HC 2.5 % rectal cream APPLY CREAM RECTALLY TO AFFECTED AREA TWICE DAILY   triamterene-hydrochlorothiazide (MAXZIDE) 75-50 MG tablet Take 1 tablet by mouth once daily   No facility-administered medications prior to visit.    Review of Systems  Constitutional:  Positive for fatigue. Negative for appetite change, chills and fever.  Respiratory:  Positive for shortness of breath. Negative for chest tightness.   Cardiovascular:  Negative for chest pain and palpitations.  Gastrointestinal:  Negative for abdominal pain, nausea and vomiting.  Neurological:  Negative for dizziness and weakness.    {Labs  Heme  Chem  Endocrine  Serology  Results Review (optional):23779}   Objective     BP 133/61 (BP Location: Right Arm, Patient Position: Sitting, Cuff Size: Normal)   Pulse 79   Temp 98.2 F (36.8 C) (Oral)   Resp 16   Wt 164 lb 3.2 oz (74.5 kg)   SpO2 97%   BMI 30.03 kg/m  {Show previous vital signs (optional):23777}  Physical Exam   General: Appearance:    Mildly obese female in no acute distress  Eyes:    PERRL, conjunctiva/corneas clear, EOM's intact       Lungs:     Clear to auscultation bilaterally, respirations unlabored  Heart:    Normal heart rate. Regular rhythm. No murmurs, rubs, or gallops.    MS:   All extremities are intact.    Neurologic:   Awake, alert, oriented x 3. No apparent focal neurological defect.         Results for orders placed or performed in visit on 03/07/22  POCT glycosylated hemoglobin (Hb A1C)  Result Value Ref Range   Hemoglobin A1C 6.3 (A) 4.0 - 5.6 %   Est. average glucose Bld gHb Est-mCnc 134     Assessment &  Plan     1. Type 2 diabetes mellitus with vascular disease ([place]) Well controlled.  Continue current medications.    2. Other fatigue  - CBC - Renal function panel  3. Dyspnea, unspecified type  - Brain natriuretic peptide  4. Allergic rhinitis, unspecified seasonality, unspecified trigger Did well with loratadine last fall. - loratadine (EQ ALLERGY RELIEF) 10 MG tablet; Take 1 tablet (10 mg total) by mouth daily.  Dispense: 30 tablet; Refill: 1  5. Essential (primary) hypertension Well controlled.  Continue current medications.  refill potassium chloride (KLOR-CON) 10 MEQ tablet; Take 1 tablet (10 mEq total) by mouth 2 (two) times daily.  Dispense: 180 tablet; Refill: 4  6. Hypothyroidism, unspecified type Check T4 & TSH      The entirety of the information documented in the History of Present Illness, Review of Systems and Physical Exam were personally obtained by me. Portions of this information were initially documented by the CMA and reviewed by me for thoroughness and accuracy.     Lelon Huh, MD  [facility] [phone] (phone) [phone] (fax)  [place]

## 2022-03-07 ENCOUNTER — Ambulatory Visit (INDEPENDENT_AMBULATORY_CARE_PROVIDER_SITE_OTHER): Payer: PPO | Admitting: Family Medicine

## 2022-03-07 ENCOUNTER — Encounter: Payer: Self-pay | Admitting: Family Medicine

## 2022-03-07 VITALS — BP 133/61 | HR 79 | Temp 98.2°F | Resp 16 | Wt 164.2 lb

## 2022-03-07 DIAGNOSIS — R0600 Dyspnea, unspecified: Secondary | ICD-10-CM | POA: Diagnosis not present

## 2022-03-07 DIAGNOSIS — E1159 Type 2 diabetes mellitus with other circulatory complications: Secondary | ICD-10-CM | POA: Diagnosis not present

## 2022-03-07 DIAGNOSIS — J309 Allergic rhinitis, unspecified: Secondary | ICD-10-CM | POA: Diagnosis not present

## 2022-03-07 DIAGNOSIS — I10 Essential (primary) hypertension: Secondary | ICD-10-CM

## 2022-03-07 DIAGNOSIS — R5383 Other fatigue: Secondary | ICD-10-CM | POA: Diagnosis not present

## 2022-03-07 DIAGNOSIS — E039 Hypothyroidism, unspecified: Secondary | ICD-10-CM

## 2022-03-07 LAB — POCT GLYCOSYLATED HEMOGLOBIN (HGB A1C)
Est. average glucose Bld gHb Est-mCnc: 134
Hemoglobin A1C: 6.3 % — AB (ref 4.0–5.6)

## 2022-03-07 MED ORDER — LORATADINE 10 MG PO TABS: 10.0000 mg | ORAL_TABLET | Freq: Every day | ORAL | 1 refills | Status: AC

## 2022-03-07 MED ORDER — POTASSIUM CHLORIDE ER 10 MEQ PO TBCR: 10.0000 meq | EXTENDED_RELEASE_TABLET | Freq: Two times a day (BID) | ORAL | 4 refills | Status: DC

## 2022-03-08 LAB — RENAL FUNCTION PANEL
Albumin: 4.3 g/dL (ref 3.8–4.8)
BUN/Creatinine Ratio: 16 (ref 12–28)
BUN: 22 mg/dL (ref 8–27)
CO2: 23 mmol/L (ref 20–29)
Calcium: 10.3 mg/dL (ref 8.7–10.3)
Chloride: 103 mmol/L (ref 96–106)
Creatinine, Ser: 1.39 mg/dL — ABNORMAL HIGH (ref 0.57–1.00)
Glucose: 101 mg/dL — ABNORMAL HIGH (ref 70–99)
Phosphorus: 4 mg/dL (ref 3.0–4.3)
Potassium: 4.6 mmol/L (ref 3.5–5.2)
Sodium: 143 mmol/L (ref 134–144)
eGFR: 39 mL/min/{1.73_m2} — ABNORMAL LOW (ref >59)

## 2022-03-08 LAB — CBC
Hematocrit: 37.3 % (ref 34.0–46.6)
Hemoglobin: 12.2 g/dL (ref 11.1–15.9)
MCH: 30.7 pg (ref 26.6–33.0)
MCHC: 32.7 g/dL (ref 31.5–35.7)
MCV: 94 fL (ref 79–97)
Platelets: 296 10*3/uL (ref 150–450)
RBC: 3.97 x10E6/uL (ref 3.77–5.28)
RDW: 11.3 % — ABNORMAL LOW (ref 11.7–15.4)
WBC: 6.7 10*3/uL (ref 3.4–10.8)

## 2022-03-08 LAB — BRAIN NATRIURETIC PEPTIDE: BNP: 24.4 pg/mL (ref 0.0–100.0)

## 2022-03-16 ENCOUNTER — Other Ambulatory Visit: Payer: Self-pay | Admitting: Family Medicine

## 2022-03-16 DIAGNOSIS — E039 Hypothyroidism, unspecified: Secondary | ICD-10-CM

## 2022-03-16 MED ORDER — LEVOTHYROXINE SODIUM 75 MCG PO TABS: 75.0000 ug | ORAL_TABLET | Freq: Every day | ORAL | 2 refills | Status: DC

## 2022-03-17 ENCOUNTER — Telehealth: Payer: Self-pay | Admitting: Family Medicine

## 2022-03-17 NOTE — Telephone Encounter (Signed)
Patient asking for someone to contact her and let her know results from her recent lab work

## 2022-03-31 ENCOUNTER — Ambulatory Visit (INDEPENDENT_AMBULATORY_CARE_PROVIDER_SITE_OTHER): Payer: PPO

## 2022-03-31 VITALS — Wt 164.0 lb

## 2022-03-31 DIAGNOSIS — Z0000 Encounter for general adult medical examination without abnormal findings: Secondary | ICD-10-CM | POA: Diagnosis not present

## 2022-03-31 NOTE — Patient Instructions (Signed)
Destiny Gilbert , Thank you for taking time to come for your Medicare Wellness Visit. I appreciate your ongoing commitment to your health goals. Please review the following plan we discussed and let me know if I can assist you in the future.   Screening recommendations/referrals: Colonoscopy: aged out Mammogram: aged out Bone Density: 02/03/18, every 5 years until age 79 Recommended yearly ophthalmology/optometry visit for glaucoma screening and checkup Recommended yearly dental visit for hygiene and checkup  Vaccinations: Influenza vaccine: 03/27/21 Pneumococcal vaccine: 05/20/16 Tdap vaccine: 05/03/07, due if have injury Shingles vaccine: Zostavax 06/12/09   Shingrix 05/18/19, 08/24/19   Covid-19:07/14/19, 08/04/19, 04/19/20  Advanced directives: no  Conditions/risks identified: none  Next appointment: Follow up in one year for your annual wellness visit 04/02/23 @ 11 am by phone   Preventive Care 65 Years and Older, Female Preventive care refers to lifestyle choices and visits with your health care provider that can promote health and wellness. What does preventive care include? A yearly physical exam. This is also called an annual well check. Dental exams once or twice a year. Routine eye exams. Ask your health care provider how often you should have your eyes checked. Personal lifestyle choices, including: Daily care of your teeth and gums. Regular physical activity. Eating a healthy diet. Avoiding tobacco and drug use. Limiting alcohol use. Practicing safe sex. Taking low-dose aspirin every day. Taking vitamin and mineral supplements as recommended by your health care provider. What happens during an annual well check? The services and screenings done by your health care provider during your annual well check will depend on your age, overall health, lifestyle risk factors, and family history of disease. Counseling  Your health care provider may ask you questions about  your: Alcohol use. Tobacco use. Drug use. Emotional well-being. Home and relationship well-being. Sexual activity. Eating habits. History of falls. Memory and ability to understand (cognition). Work and work Statistician. Reproductive health. Screening  You may have the following tests or measurements: Height, weight, and BMI. Blood pressure. Lipid and cholesterol levels. These may be checked every 5 years, or more frequently if you are over 3 years old. Skin check. Lung cancer screening. You may have this screening every year starting at age 60 if you have a 30-pack-year history of smoking and currently smoke or have quit within the past 15 years. Fecal occult blood test (FOBT) of the stool. You may have this test every year starting at age 36. Flexible sigmoidoscopy or colonoscopy. You may have a sigmoidoscopy every 5 years or a colonoscopy every 10 years starting at age 24. Hepatitis C blood test. Hepatitis B blood test. Sexually transmitted disease (STD) testing. Diabetes screening. This is done by checking your blood sugar (glucose) after you have not eaten for a while (fasting). You may have this done every 1-3 years. Bone density scan. This is done to screen for osteoporosis. You may have this done starting at age 72. Mammogram. This may be done every 1-2 years. Talk to your health care provider about how often you should have regular mammograms. Talk with your health care provider about your test results, treatment options, and if necessary, the need for more tests. Vaccines  Your health care provider may recommend certain vaccines, such as: Influenza vaccine. This is recommended every year. Tetanus, diphtheria, and acellular pertussis (Tdap, Td) vaccine. You may need a Td booster every 10 years. Zoster vaccine. You may need this after age 43. Pneumococcal 13-valent conjugate (PCV13) vaccine. One dose is recommended after  age 24. Pneumococcal polysaccharide (PPSV23) vaccine.  One dose is recommended after age 13. Talk to your health care provider about which screenings and vaccines you need and how often you need them. This information is not intended to replace advice given to you by your health care provider. Make sure you discuss any questions you have with your health care provider. Document Released: 07/20/2015 Document Revised: 03/12/2016 Document Reviewed: 04/24/2015 Elsevier Interactive Patient Education  2017 [employer] Prevention in the Home Falls can cause injuries. They can happen to people of all ages. There are many things you can do to make your home safe and to help prevent falls. What can I do on the outside of my home? Regularly fix the edges of walkways and driveways and fix any cracks. Remove anything that might make you trip as you walk through a door, such as a raised step or threshold. Trim any bushes or trees on the path to your home. Use bright outdoor lighting. Clear any walking paths of anything that might make someone trip, such as rocks or tools. Regularly check to see if handrails are loose or broken. Make sure that both sides of any steps have handrails. Any raised decks and porches should have guardrails on the edges. Have any leaves, snow, or ice cleared regularly. Use sand or salt on walking paths during winter. Clean up any spills in your garage right away. This includes oil or grease spills. What can I do in the bathroom? Use night lights. Install grab bars by the toilet and in the tub and shower. Do not use towel bars as grab bars. Use non-skid mats or decals in the tub or shower. If you need to sit down in the shower, use a plastic, non-slip stool. Keep the floor dry. Clean up any water that spills on the floor as soon as it happens. Remove soap buildup in the tub or shower regularly. Attach bath mats securely with double-sided non-slip rug tape. Do not have throw rugs and other things on the floor that can make  you trip. What can I do in the bedroom? Use night lights. Make sure that you have a light by your bed that is easy to reach. Do not use any sheets or blankets that are too big for your bed. They should not hang down onto the floor. Have a firm chair that has side arms. You can use this for support while you get dressed. Do not have throw rugs and other things on the floor that can make you trip. What can I do in the kitchen? Clean up any spills right away. Avoid walking on wet floors. Keep items that you use a lot in easy-to-reach places. If you need to reach something above you, use a strong step stool that has a grab bar. Keep electrical cords out of the way. Do not use floor polish or wax that makes floors slippery. If you must use wax, use non-skid floor wax. Do not have throw rugs and other things on the floor that can make you trip. What can I do with my stairs? Do not leave any items on the stairs. Make sure that there are handrails on both sides of the stairs and use them. Fix handrails that are broken or loose. Make sure that handrails are as long as the stairways. Check any carpeting to make sure that it is firmly attached to the stairs. Fix any carpet that is loose or worn. Avoid having throw rugs  at the top or bottom of the stairs. If you do have throw rugs, attach them to the floor with carpet tape. Make sure that you have a light switch at the top of the stairs and the bottom of the stairs. If you do not have them, ask someone to add them for you. What else can I do to help prevent falls? Wear shoes that: Do not have high heels. Have rubber bottoms. Are comfortable and fit you well. Are closed at the toe. Do not wear sandals. If you use a stepladder: Make sure that it is fully opened. Do not climb a closed stepladder. Make sure that both sides of the stepladder are locked into place. Ask someone to hold it for you, if possible. Clearly mark and make sure that you can  see: Any grab bars or handrails. First and last steps. Where the edge of each step is. Use tools that help you move around (mobility aids) if they are needed. These include: Canes. Walkers. Scooters. Crutches. Turn on the lights when you go into a dark area. Replace any light bulbs as soon as they burn out. Set up your furniture so you have a clear path. Avoid moving your furniture around. If any of your floors are uneven, fix them. If there are any pets around you, be aware of where they are. Review your medicines with your doctor. Some medicines can make you feel dizzy. This can increase your chance of falling. Ask your doctor what other things that you can do to help prevent falls. This information is not intended to replace advice given to you by your health care provider. Make sure you discuss any questions you have with your health care provider. Document Released: 04/19/2009 Document Revised: 11/29/2015 Document Reviewed: 07/28/2014 Elsevier Interactive Patient Education  2017 [employer].

## 2022-03-31 NOTE — Progress Notes (Signed)
Virtual Visit via Telephone Note  I connected with  Destiny Gilbert on 03/31/22 at  2:45 PM EDT by telephone and verified that I am speaking with the correct person using two identifiers.  Location: Patient: home Provider: [facility] Persons participating in the virtual visit: [place]   I discussed the limitations, risks, security and privacy concerns of performing an evaluation and management service by telephone and the availability of in person appointments. The patient expressed understanding and agreed to proceed.  Interactive audio and video telecommunications were attempted between this nurse and patient, however failed, due to patient having technical difficulties OR patient did not have access to video capability.  We continued and completed visit with audio only.  Some vital signs may be absent or patient reported.   Dionisio David, LPN  Subjective:   Destiny Gilbert is a 81 y.o. female who presents for Medicare Annual (Subsequent) preventive examination.  Review of Systems     Cardiac Risk Factors include: advanced age (>62mn, >>19women);diabetes mellitus;dyslipidemia     Objective:    There were no vitals filed for this visit. There is no height or weight on file to calculate BMI.     03/31/2022    3:05 PM 05/16/2020   11:55 AM 01/24/2020    1:39 PM 01/10/2019    8:22 AM 07/19/2018   11:10 AM 05/06/2018    5:09 PM 01/20/2018    4:55 AM  Advanced Directives  Does Patient Have a Medical Advance Directive? No No No No No Yes No  Type of ATeacher, early years/preLiving will   [place]?      No - copy requested   Would patient like information on creating a medical advance directive? No - Patient declined  No - Patient declined No - Patient declined  No - Patient declined No - Patient declined    Current Medications (verified) Outpatient Encounter Medications as of 03/31/2022  Medication  Sig   acetaminophen (TYLENOL) 500 MG tablet Take 500 mg by mouth every 6 (six) hours as needed.   albuterol (PROAIR HFA) 108 (90 Base) MCG/ACT inhaler Inhale 2 puffs into the lungs every 6 (six) hours as needed.   atorvastatin (LIPITOR) 40 MG tablet Take 1 tablet (40 mg total) by mouth daily.   ELIQUIS 5 MG TABS tablet Take 5 mg by mouth every 12 (twelve) hours.   fluticasone-salmeterol (ADVAIR) 250-50 MCG/ACT AEPB Inhale 1 puff into the lungs 2 (two) times daily.   levothyroxine (SYNTHROID) 75 MCG tablet Take 1 tablet (75 mcg total) by mouth daily before breakfast.   loratadine (EQ ALLERGY RELIEF) 10 MG tablet Take 1 tablet (10 mg total) by mouth daily.   metFORMIN (GLUCOPHAGE-XR) 500 MG 24 hr tablet TAKE 1 TABLET BY MOUTH ONCE DAILY FOR BLOOD SUGAR   nitroGLYCERIN (NITROSTAT) 0.3 MG SL tablet Place 1 tablet (0.3 mg total) under the tongue every 5 (five) minutes as needed for chest pain.   ONETOUCH ULTRA test strip USE 1 STRIP TO CHECK GLUCOSE ONCE DAILY   potassium chloride (KLOR-CON) 10 MEQ tablet Take 1 tablet (10 mEq total) by mouth 2 (two) times daily.   PROCTO-MED HC 2.5 % rectal cream APPLY CREAM RECTALLY TO AFFECTED AREA TWICE DAILY   triamterene-hydrochlorothiazide (MAXZIDE) 75-50 MG tablet Take 1 tablet by mouth once daily   amLODipine (NORVASC) 2.5 MG tablet Take 1 tablet by mouth once daily (Patient  not taking: Reported on 03/31/2022)   guaiFENesin (MUCUS RELIEF PO) Take by mouth. (Patient not taking: Reported on 03/31/2022)   levocetirizine (XYZAL) 5 MG tablet TAKE 1 TABLET BY MOUTH ONCE DAILY IN THE EVENING (Patient not taking: Reported on 03/31/2022)   No facility-administered encounter medications on file as of 03/31/2022.    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[place])    Diverticulosis    History of peptic ulcer     perforated   Hyperlipidemia    Hyperplastic colon polyp    Hypertension    Pneumonia 01/23/2015   [facility]    Stroke [facility])    Past Surgical History:  Procedure Laterality Date   ABDOMINAL HYSTERECTOMY  1994   BSO   CATARACT EXTRACTION     left eye- 09/2014; righr eye- 08/13/2014   COLONOSCOPY WITH PROPOFOL N/A 12/23/2016   Procedure: COLONOSCOPY WITH PROPOFOL;  Surgeon: Lollie Sails, MD;  Location: [facility] ENDOSCOPY;  Service: Endoscopy;  Laterality: N/A;   COLONOSCOPY WITH PROPOFOL N/A 05/16/2020   Procedure: COLONOSCOPY WITH PROPOFOL;  Surgeon: Toledo, Benay Pike, MD;  Location: [facility] ENDOSCOPY;  Service: Gastroenterology;  Laterality: N/A;   ESOPHAGOGASTRODUODENOSCOPY     ESOPHAGOGASTRODUODENOSCOPY (EGD) WITH PROPOFOL N/A 05/16/2020   Procedure: ESOPHAGOGASTRODUODENOSCOPY (EGD) WITH PROPOFOL;  Surgeon: Toledo, Benay Pike, MD;  Location: [facility] ENDOSCOPY;  Service: Gastroenterology;  Laterality: N/A;   INSERTION OF CARDIAC MONITOR     INSERTION OF CARDIAC MONITOR     LOOP RECORDER INSERTION N/A 07/29/2017   Procedure: LOOP RECORDER INSERTION;  Surgeon: Isaias Cowman, MD;  Location: [facility];  Service: Cardiovascular;  Laterality: N/A;   Family History  Problem Relation Age of Onset   Lung cancer Mother    Pancreatic cancer Father    Lung cancer Father    Hypertension Sister    Hyperlipidemia Sister    Breast cancer Neg Hx    Social History   Socioeconomic History   Marital status: Married    Spouse name: Not on file   Number of children: 3   Years of education: Not on file   Highest education level: Some college, no degree  Occupational History   Occupation: Retired  Tobacco Use   Smoking status: Never   Smokeless tobacco: Never  Vaping Use   Vaping Use: Never used  Substance and Sexual Activity   Alcohol use: Yes    Alcohol/week: 2.0 standard drinks of alcohol    Types: 2 Glasses of wine per week    Comment: 2 glasses of wine per week    Drug use:  No   Sexual activity: Not on file  Other Topics Concern   Not on file  Social History Narrative   Not on file   Social Determinants of Health   Financial Resource Strain: Low Risk  (03/31/2022)   Overall Financial Resource Strain (CARDIA)    Difficulty of Paying Living Expenses: Not hard at all  Food Insecurity: No Food Insecurity (03/31/2022)   Hunger Vital Sign    Worried About Running Out of Food in the Last Year: Never true    Ran Out of Food in the Last Year: Never true  Transportation Needs: No Transportation Needs (03/31/2022)   PRAPARE - Hydrologist (Medical): No    Lack of Transportation (Non-Medical): No  Physical Activity: Inactive (  03/31/2022)   Exercise Vital Sign    Days of Exercise per Week: 0 days    Minutes of Exercise per Session: 0 min  Stress: No Stress Concern Present (03/31/2022)   [place]    Feeling of Stress : Only a little  Social Connections: Moderately Integrated (03/31/2022)   Social Connection and Isolation Panel [NHANES]    Frequency of Communication with Friends and Family: More than three times a week    Frequency of Social Gatherings with Friends and Family: Once a week    Attends Religious Services: More than 4 times per year    Active Member of [employer] or Organizations: No    Attends Music therapist: Never    Marital Status: Married    Tobacco Counseling Counseling given: Not Answered   Clinical Intake:  Pre-visit preparation completed: Yes  Pain : No/denies pain     Nutritional Risks: None Diabetes: Yes CBG done?: No Did pt. bring in CBG monitor from home?: No  How often do you need to have someone help you when you read instructions, pamphlets, or other written materials from your doctor or pharmacy?: 1 - Never  Diabetic?yes Nutrition Risk Assessment:  Has the patient had any N/V/D within the last 2 months?  No  Does  the patient have any non-healing wounds?  No  Has the patient had any unintentional weight loss or weight gain?  No   Diabetes:  Is the patient diabetic?  Yes  If diabetic, was a CBG obtained today?  No  Did the patient bring in their glucometer from home?  No  How often do you monitor your CBG's? Once a week.   Financial Strains and Diabetes Management:  Are you having any financial strains with the device, your supplies or your medication? No .  Does the patient want to be seen by Chronic Care Management for management of their diabetes?  No  Would the patient like to be referred to a Nutritionist or for Diabetic Management?  No   Diabetic Exams:  Diabetic Eye Exam: Completed 10/03/21. Pt has been advised about the importance in completing this exam.  Diabetic Foot Exam: Completed 01/10/19. Pt has been advised about the importance in completing this exam.   Interpreter Needed?: No  Information entered by :: Kirke Shaggy, LPN   Activities of Daily Living    03/31/2022    3:05 PM 03/07/2022    8:45 AM  In your present state of health, do you have any difficulty performing the following activities:  Hearing? 0 0  Vision? 0 0  Difficulty concentrating or making decisions? 0 0  Walking or climbing stairs? 1 1  Dressing or bathing? 0 0  Doing errands, shopping? 0 0  Preparing Food and eating ? N   Using the Toilet? N   In the past six months, have you accidently leaked urine? N   Do you have problems with loss of bowel control? N   Managing your Medications? N   Managing your Finances? N   Housekeeping or managing your Housekeeping? N     Patient Care Team: Birdie Sons, MD as PCP - General (Family Medicine) Isaias Cowman, MD as Consulting Physician (Cardiology) Anabel Bene, MD as Referring Physician (Neurology) Clyde Canterbury, MD as Referring Physician (Otolaryngology) Leandrew Koyanagi, MD as Referring Physician (Ophthalmology) Efrain Sella, MD as  Consulting Physician (Gastroenterology)  Indicate any recent Medical Services you may have received  from other than [facility] providers in the past year (date may be approximate).     Assessment:   This is a routine wellness examination for Teagan.  Hearing/Vision screen Hearing Screening - Comments:: No aids Vision Screening - Comments:: Wears glasses- [facility]  Dietary issues and exercise activities discussed: Current Exercise Habits: The patient does not participate in regular exercise at present   Goals Addressed             This Visit's Progress    DIET - EAT MORE FRUITS AND VEGETABLES         Depression Screen    03/31/2022    3:03 PM 03/07/2022    8:45 AM 11/04/2021    8:36 AM 03/27/2021   10:18 AM 10/24/2020    8:36 AM 08/01/2020   10:21 AM 03/23/2020   10:48 AM  PHQ 2/9 Scores  PHQ - 2 Score 0 0 0 0 0 0 0  PHQ- 9 Score 0 3 0 1 0 0 0    Fall Risk    03/31/2022    3:05 PM 03/07/2022    8:45 AM 11/04/2021    8:35 AM 03/27/2021   10:18 AM 10/24/2020    8:36 AM  Fall Risk   Falls in the past year? 0 0 0 0 0  Number falls in past yr: 0 0 0 0 0  Injury with Fall? 0 0 0 0 0  Risk for fall due to : No Fall Risks   No Fall Risks   Follow up Falls prevention discussed;Falls evaluation completed Falls evaluation completed Falls evaluation completed Falls evaluation completed     FALL RISK PREVENTION PERTAINING TO THE HOME:  Any stairs in or around the home? No  If so, are there any without handrails? No  Home free of loose throw rugs in walkways, pet beds, electrical cords, etc? Yes  Adequate lighting in your home to reduce risk of falls? Yes   ASSISTIVE DEVICES UTILIZED TO PREVENT FALLS:  Life alert? No  Use of a cane, walker or w/c? No  Grab bars in the bathroom? No  Shower chair or bench in shower? No  Elevated toilet seat or a handicapped toilet? No    Cognitive Function:        03/31/2022    3:07 PM 03/27/2021   10:16 AM 01/24/2020    1:45 PM 05/27/2017     9:16 AM 01/02/2017    9:14 AM  6CIT Screen  What Year? 0 points 0 points 0 points 0 points 0 points  What month? 0 points 0 points 0 points 0 points 0 points  What time? 0 points 0 points 0 points 0 points 0 points  Count back from 20 0 points 0 points 0 points 0 points 0 points  Months in reverse 0 points 0 points 0 points 0 points 0 points  Repeat phrase 2 points 2 points 2 points 2 points 0 points  Total Score 2 points 2 points 2 points 2 points 0 points    Immunizations Immunization History  Administered Date(s) Administered   Fluad Quad(high Dose 65+) 03/27/2021   Influenza, High Dose Seasonal PF 03/18/2016, 03/27/2017, 03/29/2018, 04/05/2019, 04/10/2020   Influenza,inj,Quad PF,6+ Mos 05/17/2015   PFIZER(Purple Top)SARS-COV-2 Vaccination 07/14/2019, 08/04/2019, 04/19/2020   Pneumococcal Conjugate-13 05/20/2016   Pneumococcal Polysaccharide-23 04/12/2008, 09/12/2013   Tdap 05/03/2007   Zoster Recombinat (Shingrix) 05/18/2019, 08/24/2019   Zoster, Live 06/12/2009    TDAP status: Due, Education has been provided regarding the  importance of this vaccine. Advised may receive this vaccine at local pharmacy or Health Dept. Aware to provide a copy of the vaccination record if obtained from local pharmacy or Health Dept. Verbalized acceptance and understanding.  Flu Vaccine status: Up to date  Pneumococcal vaccine status: Up to date  Covid-19 vaccine status: Completed vaccines  Qualifies for Shingles Vaccine? Yes   Zostavax completed Yes   Shingrix Completed?: Yes  Screening Tests Health Maintenance  Topic Date Due   TETANUS/TDAP  05/02/2017   COVID-19 Vaccine (4 - Pfizer series) 06/14/2020   INFLUENZA VACCINE  02/04/2022   HEMOGLOBIN A1C  09/05/2022   OPHTHALMOLOGY EXAM  10/04/2022   Diabetic kidney evaluation - Urine ACR  11/05/2022   DEXA SCAN  02/04/2023   Diabetic kidney evaluation - GFR measurement  03/08/2023   Pneumonia Vaccine 12+ Years old  Completed   Zoster  Vaccines- Shingrix  Completed   HPV VACCINES  Aged Out   COLONOSCOPY (Pts 45-31yr Insurance coverage will need to be confirmed)  Discontinued    Health Maintenance  Health Maintenance Due  Topic Date Due   TETANUS/TDAP  05/02/2017   COVID-19 Vaccine (4 - Pfizer series) 06/14/2020   INFLUENZA VACCINE  02/04/2022    Colorectal cancer screening: No longer required.   Mammogram status: No longer required due to age.- had one 08/30/21  Bone Density status: Completed 02/03/18. Results reflect: Bone density results: NORMAL. Repeat every 5 years.  Lung Cancer Screening: (Low Dose CT Chest recommended if Age 79-80years, 30 pack-year currently smoking OR have quit w/in 15years.) does not qualify.    Additional Screening:  Hepatitis C Screening: does not qualify; Completed no  Vision Screening: Recommended annual ophthalmology exams for early detection of glaucoma and other disorders of the eye. Is the patient up to date with their annual eye exam?  Yes  Who is the provider or what is the name of the office in which the patient attends annual eye exams? [facility] If pt is not established with a provider, would they like to be referred to a provider to establish care? No .   Dental Screening: Recommended annual dental exams for proper oral hygiene  Community Resource Referral / Chronic Care Management: CRR required this visit?  No   CCM required this visit?  No      Plan:     I have personally reviewed and noted the following in the patient's chart:   Medical and social history Use of alcohol, tobacco or illicit drugs  Current medications and supplements including opioid prescriptions. Patient is not currently taking opioid prescriptions. Functional ability and status Nutritional status Physical activity Advanced directives List of other physicians Hospitalizations, surgeries, and ER visits in previous 12 months Vitals Screenings to include cognitive, depression, and  falls Referrals and appointments  In addition, I have reviewed and discussed with patient certain preventive protocols, quality metrics, and best practice recommendations. A written personalized care plan for preventive services as well as general preventive health recommendations were provided to patient.     LDionisio David LPN   [id number]  Nurse Notes: none

## 2022-04-05 LAB — SPECIMEN STATUS REPORT

## 2022-04-05 LAB — TSH: TSH: 4.84 u[IU]/mL — ABNORMAL HIGH (ref 0.450–4.500)

## 2022-04-05 LAB — T4: T4, Total: 7.7 ug/dL (ref 4.5–12.0)

## 2022-04-08 ENCOUNTER — Other Ambulatory Visit: Payer: Self-pay | Admitting: Family Medicine

## 2022-04-08 DIAGNOSIS — I10 Essential (primary) hypertension: Secondary | ICD-10-CM

## 2022-04-09 ENCOUNTER — Other Ambulatory Visit: Payer: Self-pay | Admitting: Family Medicine

## 2022-04-09 DIAGNOSIS — E119 Type 2 diabetes mellitus without complications: Secondary | ICD-10-CM

## 2022-04-09 NOTE — Telephone Encounter (Signed)
Requested Prescriptions  Pending Prescriptions Disp Refills  . metFORMIN (GLUCOPHAGE-XR) 500 MG 24 hr tablet [Pharmacy Med Name: metFORMIN HCl ER 500 MG Oral Tablet Extended Release 24 Hour] 90 tablet 1    Sig: TAKE 1 TABLET BY MOUTH ONCE DAILY FOR BLOOD SUGAR     Endocrinology:  Diabetes - Biguanides Failed - 04/09/2022 12:26 PM      Failed - Cr in normal range and within 360 days    Creatinine  Date Value Ref Range Status  09/13/2013 1.18 0.60 - 1.30 mg/dL Final   Creatinine, Ser  Date Value Ref Range Status  03/07/2022 1.39 (H) 0.57 - 1.00 mg/dL Final   Creatinine, POC  Date Value Ref Range Status  09/11/2016 n/a mg/dL Final         Failed - eGFR in normal range and within 360 days    EGFR (African American)  Date Value Ref Range Status  09/13/2013 54 (L)  Final   GFR calc Af Amer  Date Value Ref Range Status  01/23/2020 71 >59 mL/min/1.73 Final    Comment:    **[facility] currently reports eGFR in compliance with the current**   recommendations of the [employer]. [facility] will   update reporting as new guidelines are published from the NKF-ASN   Task force.    EGFR (Non-African Amer.)  Date Value Ref Range Status  09/13/2013 47 (L)  Final    Comment:    eGFR values <65mL/min/1.73 m2 may be an indication of chronic kidney disease (CKD). Calculated eGFR is useful in patients with stable renal function. The eGFR calculation will not be reliable in acutely ill patients when serum creatinine is changing rapidly. It is not useful in  patients on dialysis. The eGFR calculation may not be applicable to patients at the low and high extremes of body sizes, pregnant women, and vegetarians.    GFR calc non Af Amer  Date Value Ref Range Status  01/23/2020 62 >59 mL/min/1.73 Final   eGFR  Date Value Ref Range Status  03/07/2022 39 (L) >59 mL/min/1.73 Final         Failed - B12 Level in normal range and within 720 days    No results found for: "VITAMINB12"        Failed - CBC within normal limits and completed in the last 12 months    WBC  Date Value Ref Range Status  03/07/2022 6.7 3.4 - 10.8 x10E3/uL Final  07/19/2018 4.2 4.0 - 10.5 K/uL Final   RBC  Date Value Ref Range Status  03/07/2022 3.97 3.77 - 5.28 x10E6/uL Final  07/19/2018 4.31 3.87 - 5.11 MIL/uL Final   Hemoglobin  Date Value Ref Range Status  03/07/2022 12.2 11.1 - 15.9 g/dL Final   Hematocrit  Date Value Ref Range Status  03/07/2022 37.3 34.0 - 46.6 % Final   MCHC  Date Value Ref Range Status  03/07/2022 32.7 31.5 - 35.7 g/dL Final  07/19/2018 33.0 30.0 - 36.0 g/dL Final   [facility]  Date Value Ref Range Status  03/07/2022 30.7 26.6 - 33.0 pg Final  07/19/2018 31.1 26.0 - 34.0 pg Final   MCV  Date Value Ref Range Status  03/07/2022 94 79 - 97 fL Final  09/13/2013 94 80 - 100 fL Final   No results found for: "PLTCOUNTKUC", "LABPLAT", "POCPLA" RDW  Date Value Ref Range Status  03/07/2022 11.3 (L) 11.7 - 15.4 % Final  09/13/2013 13.1 11.5 - 14.5 % Final  Passed - HBA1C is between 0 and 7.9 and within 180 days    Hemoglobin A1C  Date Value Ref Range Status  03/07/2022 6.3 (A) 4.0 - 5.6 % Final   Hgb A1c MFr Bld  Date Value Ref Range Status  11/04/2021 6.2 (H) 4.8 - 5.6 % Final    Comment:             Prediabetes: 5.7 - 6.4          Diabetes: >6.4          Glycemic control for adults with diabetes: <7.0          Passed - Valid encounter within last 6 months    Recent Outpatient Visits          1 month ago 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 Birdie Sons, MD   9 months ago Essential (primary) hypertension   [facility] Birdie Sons, MD   1 year ago Annual physical exam   [facility] Birdie Sons, MD   1 year ago Salpingitis of both  eustachian tubes   [facility] Jerrol Banana., MD      Future Appointments            In 3 months Fisher, Kirstie Peri, MD [facility], [place]

## 2022-04-22 ENCOUNTER — Other Ambulatory Visit: Payer: Self-pay | Admitting: Family Medicine

## 2022-04-22 DIAGNOSIS — E782 Mixed hyperlipidemia: Secondary | ICD-10-CM

## 2022-06-02 DIAGNOSIS — I63532 Cerebral infarction due to unspecified occlusion or stenosis of left posterior cerebral artery: Secondary | ICD-10-CM | POA: Diagnosis not present

## 2022-06-02 DIAGNOSIS — I10 Essential (primary) hypertension: Secondary | ICD-10-CM | POA: Diagnosis not present

## 2022-06-02 DIAGNOSIS — Z23 Encounter for immunization: Secondary | ICD-10-CM | POA: Diagnosis not present

## 2022-06-02 DIAGNOSIS — Z95818 Presence of other cardiac implants and grafts: Secondary | ICD-10-CM | POA: Diagnosis not present

## 2022-06-02 DIAGNOSIS — I480 Paroxysmal atrial fibrillation: Secondary | ICD-10-CM | POA: Diagnosis not present

## 2022-06-09 ENCOUNTER — Other Ambulatory Visit: Payer: Self-pay | Admitting: Family Medicine

## 2022-06-09 DIAGNOSIS — I10 Essential (primary) hypertension: Secondary | ICD-10-CM

## 2022-06-09 NOTE — Telephone Encounter (Signed)
Refilled 05/21/2022 #90 4 rf Requested Prescriptions  Pending Prescriptions Disp Refills   amLODipine (NORVASC) 2.5 MG tablet [Pharmacy Med Name: amLODIPine Besylate 2.5 MG Oral Tablet] 90 tablet 0    Sig: Take 1 tablet by mouth once daily     Cardiovascular: Calcium Channel Blockers 2 Passed - 06/09/2022  9:45 AM      Passed - Last BP in normal range    BP Readings from Last 1 Encounters:  03/07/22 133/61         Passed - Last Heart Rate in normal range    Pulse Readings from Last 1 Encounters:  03/07/22 79         Passed - Valid encounter within last 6 months    Recent Outpatient Visits           3 months ago Type 2 diabetes mellitus with vascular disease ([place])   [facility] Birdie Sons, MD   7 months ago 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

## 2022-06-12 ENCOUNTER — Other Ambulatory Visit: Payer: Self-pay | Admitting: Family Medicine

## 2022-06-12 DIAGNOSIS — I10 Essential (primary) hypertension: Secondary | ICD-10-CM

## 2022-06-12 MED ORDER — AMLODIPINE BESYLATE 2.5 MG PO TABS: 2.5000 mg | ORAL_TABLET | Freq: Every day | ORAL | 4 refills | Status: DC

## 2022-06-12 NOTE — Telephone Encounter (Signed)
Copied from [place] [phone]. Topic: General - Other >> Jun 12, 2022 11:21 AM Everette C wrote: Reason for CRM: Medication Refill - Medication: amLODipine (NORVASC) 2.5 MG tablet [[id number]] - the patient would like enough to make it to their appt on 06/20/22  Has the patient contacted their pharmacy? Yes.   (Agent: If no, request that the patient contact the pharmacy for the refill. If patient does not wish to contact the pharmacy document the reason why and proceed with request.) (Agent: If yes, when and what did the pharmacy advise?)  Preferred Pharmacy (with phone number or street name): [place], [place] - [address] [place] [address] Phone: [phone] Fax: [phone] Hours: Not open 24 hours   Has the patient been seen for an appointment in the last year OR does the patient have an upcoming appointment? Yes.    Agent: Please be advised that RX refills may take up to 3 business days. sk that you follow-up with your pharmacy.

## 2022-06-12 NOTE — Telephone Encounter (Signed)
Requested medication (s) are due for refill today: expired medication  Requested medication (s) are on the active medication list: yes  Last refill:  05/21/21 #90 4 refills  Future visit scheduled: yes in 1 week   Notes to clinic:  expired medication. Do you want to renew Rx? Patient out of medication and requesting courtesy refill until seen in 1 week. Can patient get Rx renewal for #90?     Requested Prescriptions  Pending Prescriptions Disp Refills   amLODipine (NORVASC) 2.5 MG tablet 90 tablet 4    Sig: Take 1 tablet (2.5 mg total) by mouth daily.     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, [place]   In 3 weeks Caryn Section, Kirstie Peri, MD [facility], [place]

## 2022-06-19 NOTE — Progress Notes (Signed)
I,Sha'taria Tyson,acting as a Education administrator for Lelon Huh, MD.,have documented all relevant documentation on the behalf of Lelon Huh, MD,as directed by  Lelon Huh, MD while in the presence of Lelon Huh, MD.   Established patient visit   Patient: Destiny Gilbert   DOB: 1940-09-10   81 y.o. Female  MRN: [id number] Visit Date: 06/20/2022  Today's healthcare provider: Lelon Huh, MD   Chief Complaint  Patient presents with   Hypertension   Hyperlipidemia   Hypothyroidism   Diabetes   Subjective    HPI  Diabetes Mellitus Type II, follow-up  Lab Results  Component Value Date   HGBA1C 6.3 (A) 03/07/2022   HGBA1C 6.2 (H) 11/04/2021   HGBA1C 6.2 (H) 07/03/2021   Last seen for diabetes 3 months ago.  Management since then includes continuing the same treatment.  Home blood sugar records: fasting range: 120-130 Most Recent Eye Exam: 10/03/2021  --------------------------------------------------------------------------------------------------- Hypertension, follow-up  BP Readings from Last 3 Encounters:  03/07/22 133/61  11/04/21 116/61  07/03/21 119/67   Wt Readings from Last 3 Encounters:  03/31/22 164 lb (74.4 kg)  03/07/22 164 lb 3.2 oz (74.5 kg)  11/04/21 162 lb 4.8 oz (73.6 kg)     She was last seen for hypertension 3 months ago.  Management since that visit includes Well controlled.  Continue current medications.    Outside blood pressures are {not being checked.  -------------------------------------------------------------------------------------------------- Is also here to follow up hypothyrodism since increasing levothyroxine from 7mg to 734m after labs last checked.  Lab Results  Component Value Date   TSH 4.840 (H) 03/07/2022   T4TOTAL 7.7 03/07/2022  She states she feels her energy level is significantly better since increasing dose and is having no adverse effects.    Medications: Outpatient Medications Prior to Visit  Medication  Sig   acetaminophen (TYLENOL) 500 MG tablet Take 500 mg by mouth every 6 (six) hours as needed.   albuterol (PROAIR HFA) 108 (90 Base) MCG/ACT inhaler Inhale 2 puffs into the lungs every 6 (six) hours as needed.   amLODipine (NORVASC) 2.5 MG tablet Take 1 tablet (2.5 mg total) by mouth daily.   atorvastatin (LIPITOR) 40 MG tablet Take 1 tablet by mouth once daily   ELIQUIS 5 MG TABS tablet Take 5 mg by mouth every 12 (twelve) hours.   fluticasone-salmeterol (ADVAIR) 250-50 MCG/ACT AEPB Inhale 1 puff into the lungs 2 (two) times daily.   guaiFENesin (MUCUS RELIEF PO) Take by mouth. (Patient not taking: Reported on 03/31/2022)   levocetirizine (XYZAL) 5 MG tablet TAKE 1 TABLET BY MOUTH ONCE DAILY IN THE EVENING (Patient not taking: Reported on 03/31/2022)   levothyroxine (SYNTHROID) 75 MCG tablet Take 1 tablet (75 mcg total) by mouth daily before breakfast.   loratadine (EQ ALLERGY RELIEF) 10 MG tablet Take 1 tablet (10 mg total) by mouth daily.   metFORMIN (GLUCOPHAGE-XR) 500 MG 24 hr tablet TAKE 1 TABLET BY MOUTH ONCE DAILY FOR BLOOD SUGAR   nitroGLYCERIN (NITROSTAT) 0.3 MG SL tablet Place 1 tablet (0.3 mg total) under the tongue every 5 (five) minutes as needed for chest pain.   ONETOUCH ULTRA test strip USE 1 STRIP TO CHECK GLUCOSE ONCE DAILY   potassium chloride (KLOR-CON) 10 MEQ tablet Take 1 tablet (10 mEq total) by mouth 2 (two) times daily.   PROCTO-MED HC 2.5 % rectal cream APPLY CREAM RECTALLY TO AFFECTED AREA TWICE DAILY   triamterene-hydrochlorothiazide (MAXZIDE) 75-50 MG tablet Take 1 tablet by mouth once  daily   No facility-administered medications prior to visit.    Review of Systems  Constitutional:  Negative for appetite change, chills, fatigue and fever.  Respiratory:  Negative for chest tightness and shortness of breath.   Cardiovascular:  Negative for chest pain and palpitations.  Gastrointestinal:  Negative for abdominal pain, nausea and vomiting.  Neurological:  Negative  for dizziness and weakness.       Objective    BP 116/72 (BP Location: Left Arm, Patient Position: Sitting, Cuff Size: Normal)   Pulse 96   Ht '5\' 2"'$  (1.575 m)   Wt 161 lb 12.8 oz (73.4 kg)   SpO2 95%   BMI 29.59 kg/m    Physical Exam    General: Appearance:    Well developed, well nourished female in no acute distress  Eyes:    PERRL, conjunctiva/corneas clear, EOM's intact       Lungs:     Clear to auscultation bilaterally, respirations unlabored  Heart:    Normal heart rate. Regular rhythm. No murmurs, rubs, or gallops.    MS:   All extremities are intact.    Neurologic:   Awake, alert, oriented x 3. No apparent focal neurological defect.         Assessment & Plan      1. Hypothyroidism, unspecified type Feeling better since increase dose of levothyroxine in September - TSH - T4, free  2. Type 2 diabetes mellitus with vascular disease (HCC)  - Hemoglobin A1c  3. Mixed hyperlipidemia She is tolerating atorvastatin well with no adverse effects.   - Lipid panel  4. Essential (primary) hypertension Well controlled. Continue current medications.   -Comprehensive metabolic panel  5. Intermittent atrial fibrillation (HCC) Normal rhythm on exam today. ON DOAC.  She reports she had flu vaccine at [facility].      The entirety of the information documented in the History of Present Illness, Review of Systems and Physical Exam were personally obtained by me. Portions of this information were initially documented by the CMA and reviewed by me for thoroughness and accuracy.     Lelon Huh, MD  [facility] [phone] (phone) [phone] (fax)  [place]

## 2022-06-20 ENCOUNTER — Encounter: Payer: Self-pay | Admitting: Family Medicine

## 2022-06-20 ENCOUNTER — Ambulatory Visit (INDEPENDENT_AMBULATORY_CARE_PROVIDER_SITE_OTHER): Payer: PPO | Admitting: Family Medicine

## 2022-06-20 VITALS — BP 116/72 | HR 96 | Ht 62.0 in | Wt 161.8 lb

## 2022-06-20 DIAGNOSIS — E782 Mixed hyperlipidemia: Secondary | ICD-10-CM | POA: Diagnosis not present

## 2022-06-20 DIAGNOSIS — I480 Paroxysmal atrial fibrillation: Secondary | ICD-10-CM | POA: Diagnosis not present

## 2022-06-20 DIAGNOSIS — E1159 Type 2 diabetes mellitus with other circulatory complications: Secondary | ICD-10-CM | POA: Diagnosis not present

## 2022-06-20 DIAGNOSIS — E039 Hypothyroidism, unspecified: Secondary | ICD-10-CM

## 2022-06-20 DIAGNOSIS — I10 Essential (primary) hypertension: Secondary | ICD-10-CM

## 2022-06-21 LAB — COMPREHENSIVE METABOLIC PANEL
ALT: 15 IU/L (ref 0–32)
AST: 25 IU/L (ref 0–40)
Albumin/Globulin Ratio: 1.3 (ref 1.2–2.2)
Albumin: 3.9 g/dL (ref 3.8–4.8)
Alkaline Phosphatase: 102 IU/L (ref 44–121)
BUN/Creatinine Ratio: 15 (ref 12–28)
BUN: 21 mg/dL (ref 8–27)
Bilirubin Total: 0.4 mg/dL (ref 0.0–1.2)
CO2: 24 mmol/L (ref 20–29)
Calcium: 9.8 mg/dL (ref 8.7–10.3)
Chloride: 105 mmol/L (ref 96–106)
Creatinine, Ser: 1.44 mg/dL — ABNORMAL HIGH (ref 0.57–1.00)
Globulin, Total: 3.1 g/dL (ref 1.5–4.5)
Glucose: 112 mg/dL — ABNORMAL HIGH (ref 70–99)
Potassium: 4.6 mmol/L (ref 3.5–5.2)
Sodium: 143 mmol/L (ref 134–144)
Total Protein: 7 g/dL (ref 6.0–8.5)
eGFR: 37 mL/min/{1.73_m2} — ABNORMAL LOW (ref >59)

## 2022-06-21 LAB — LIPID PANEL
Chol/HDL Ratio: 3.1 ratio (ref 0.0–4.4)
Cholesterol, Total: 169 mg/dL (ref 100–199)
HDL: 55 mg/dL (ref >39)
LDL Chol Calc (NIH): 84 mg/dL (ref 0–99)
Triglycerides: 175 mg/dL — ABNORMAL HIGH (ref 0–149)
VLDL Cholesterol Cal: 30 mg/dL (ref 5–40)

## 2022-06-21 LAB — TSH: TSH: 0.666 u[IU]/mL (ref 0.450–4.500)

## 2022-06-21 LAB — HEMOGLOBIN A1C
Est. average glucose Bld gHb Est-mCnc: 137 mg/dL
Hgb A1c MFr Bld: 6.4 % — ABNORMAL HIGH (ref 4.8–5.6)

## 2022-06-21 LAB — T4, FREE: Free T4: 1.39 ng/dL (ref 0.82–1.77)

## 2022-07-08 ENCOUNTER — Ambulatory Visit: Payer: PPO | Admitting: Family Medicine

## 2022-07-21 ENCOUNTER — Other Ambulatory Visit: Payer: Self-pay | Admitting: Family Medicine

## 2022-07-21 DIAGNOSIS — I10 Essential (primary) hypertension: Secondary | ICD-10-CM

## 2022-08-29 NOTE — Progress Notes (Unsigned)
   Argentina Ponder DeSanto,acting as a scribe for Lelon Huh, MD.,have documented all relevant documentation on the behalf of Lelon Huh, MD,as directed by  Lelon Huh, MD while in the presence of Lelon Huh, MD.     Established patient visit   Patient: Destiny Gilbert   DOB: 1941-01-16   82 y.o. Female  MRN: [id number] Visit Date: 09/01/2022  Today's healthcare provider: Lelon Huh, MD   No chief complaint on file.  Subjective    HPI  ***  Medications: Outpatient Medications Prior to Visit  Medication Sig   acetaminophen (TYLENOL) 500 MG tablet Take 500 mg by mouth every 6 (six) hours as needed.   albuterol (PROAIR HFA) 108 (90 Base) MCG/ACT inhaler Inhale 2 puffs into the lungs every 6 (six) hours as needed.   amLODipine (NORVASC) 2.5 MG tablet Take 1 tablet (2.5 mg total) by mouth daily.   atorvastatin (LIPITOR) 40 MG tablet Take 1 tablet by mouth once daily   ELIQUIS 5 MG TABS tablet Take 5 mg by mouth every 12 (twelve) hours.   fluticasone-salmeterol (ADVAIR) 250-50 MCG/ACT AEPB Inhale 1 puff into the lungs 2 (two) times daily.   GLUCOSAMINE-CHONDROITIN PO Take by mouth 2 (two) times daily.   guaiFENesin (MUCUS RELIEF PO) Take by mouth.   levocetirizine (XYZAL) 5 MG tablet TAKE 1 TABLET BY MOUTH ONCE DAILY IN THE EVENING   levothyroxine (SYNTHROID) 75 MCG tablet Take 1 tablet (75 mcg total) by mouth daily before breakfast.   loratadine (EQ ALLERGY RELIEF) 10 MG tablet Take 1 tablet (10 mg total) by mouth daily.   metFORMIN (GLUCOPHAGE-XR) 500 MG 24 hr tablet TAKE 1 TABLET BY MOUTH ONCE DAILY FOR BLOOD SUGAR   nitroGLYCERIN (NITROSTAT) 0.3 MG SL tablet Place 1 tablet (0.3 mg total) under the tongue every 5 (five) minutes as needed for chest pain.   ONETOUCH ULTRA test strip USE 1 STRIP TO CHECK GLUCOSE ONCE DAILY   potassium chloride (KLOR-CON) 10 MEQ tablet Take 1 tablet (10 mEq total) by mouth 2 (two) times daily.   PROCTO-MED HC 2.5 % rectal cream APPLY CREAM  RECTALLY TO AFFECTED AREA TWICE DAILY   triamterene-hydrochlorothiazide (MAXZIDE) 75-50 MG tablet Take 1 tablet by mouth once daily   No facility-administered medications prior to visit.    Review of Systems  {Labs  Heme  Chem  Endocrine  Serology  Results Review (optional):23779}   Objective    There were no vitals taken for this visit. {Show previous vital signs (optional):23777}  Physical Exam  ***  No results found for any visits on 09/01/22.  Assessment & Plan     ***  No follow-ups on file.      {provider attestation***:1}   Lelon Huh, MD  [place] [phone] (phone) [phone] (fax)  [place]

## 2022-09-01 ENCOUNTER — Ambulatory Visit (INDEPENDENT_AMBULATORY_CARE_PROVIDER_SITE_OTHER): Payer: PPO | Admitting: Family Medicine

## 2022-09-01 VITALS — BP 144/72 | HR 89 | Temp 98.4°F | Ht 62.0 in | Wt 164.0 lb

## 2022-09-01 DIAGNOSIS — R609 Edema, unspecified: Secondary | ICD-10-CM | POA: Diagnosis not present

## 2022-09-01 DIAGNOSIS — I10 Essential (primary) hypertension: Secondary | ICD-10-CM

## 2022-09-01 DIAGNOSIS — R1310 Dysphagia, unspecified: Secondary | ICD-10-CM

## 2022-09-01 NOTE — Patient Instructions (Signed)
.   Please review the attached list of medications and notify my office if there are any errors.   . Please bring all of your medications to every appointment so we can make sure that our medication list is the same as yours.   

## 2022-09-12 ENCOUNTER — Other Ambulatory Visit: Payer: Self-pay | Admitting: Family Medicine

## 2022-09-12 DIAGNOSIS — J309 Allergic rhinitis, unspecified: Secondary | ICD-10-CM

## 2022-09-12 DIAGNOSIS — J45909 Unspecified asthma, uncomplicated: Secondary | ICD-10-CM

## 2022-09-17 ENCOUNTER — Other Ambulatory Visit: Payer: Self-pay | Admitting: Family Medicine

## 2022-09-17 ENCOUNTER — Ambulatory Visit
Admission: RE | Admit: 2022-09-17 | Discharge: 2022-09-17 | Disposition: A | Discharge status: Home / Self Care | Payer: PPO | Source: Ambulatory Visit | Attending: Family Medicine | Admitting: Family Medicine

## 2022-09-17 DIAGNOSIS — K449 Diaphragmatic hernia without obstruction or gangrene: Secondary | ICD-10-CM | POA: Diagnosis not present

## 2022-09-17 DIAGNOSIS — K222 Esophageal obstruction: Secondary | ICD-10-CM | POA: Insufficient documentation

## 2022-09-17 DIAGNOSIS — K224 Dyskinesia of esophagus: Secondary | ICD-10-CM | POA: Diagnosis not present

## 2022-09-17 DIAGNOSIS — I10 Essential (primary) hypertension: Secondary | ICD-10-CM

## 2022-09-17 DIAGNOSIS — K219 Gastro-esophageal reflux disease without esophagitis: Secondary | ICD-10-CM | POA: Diagnosis not present

## 2022-09-17 DIAGNOSIS — R609 Edema, unspecified: Secondary | ICD-10-CM

## 2022-09-17 DIAGNOSIS — R1310 Dysphagia, unspecified: Secondary | ICD-10-CM | POA: Insufficient documentation

## 2022-10-01 ENCOUNTER — Other Ambulatory Visit: Payer: Self-pay | Admitting: Family Medicine

## 2022-10-01 DIAGNOSIS — Z1231 Encounter for screening mammogram for malignant neoplasm of breast: Secondary | ICD-10-CM

## 2022-10-08 ENCOUNTER — Telehealth: Payer: Self-pay | Admitting: Family Medicine

## 2022-10-08 DIAGNOSIS — H43813 Vitreous degeneration, bilateral: Secondary | ICD-10-CM | POA: Diagnosis not present

## 2022-10-08 DIAGNOSIS — E119 Type 2 diabetes mellitus without complications: Secondary | ICD-10-CM | POA: Diagnosis not present

## 2022-10-08 LAB — HM DIABETES EYE EXAM

## 2022-10-08 NOTE — Telephone Encounter (Signed)
Error

## 2022-10-16 ENCOUNTER — Other Ambulatory Visit: Payer: Self-pay | Admitting: Family Medicine

## 2022-10-16 DIAGNOSIS — E119 Type 2 diabetes mellitus without complications: Secondary | ICD-10-CM

## 2022-10-17 ENCOUNTER — Ambulatory Visit
Admission: RE | Admit: 2022-10-17 | Discharge: 2022-10-17 | Disposition: A | Discharge status: Home / Self Care | Payer: PPO | Source: Ambulatory Visit | Attending: Family Medicine | Admitting: Family Medicine

## 2022-10-17 DIAGNOSIS — Z1231 Encounter for screening mammogram for malignant neoplasm of breast: Secondary | ICD-10-CM | POA: Diagnosis not present

## 2022-10-17 NOTE — Telephone Encounter (Signed)
Requested medications are due for refill today.  unsure  Requested medications are on the active medications list.  yes  Last refill. 08/14/2020 28g 1 rf  Future visit scheduled.   yes  Notes to clinic.  Refill not delegated.    Requested Prescriptions  Pending Prescriptions Disp Refills   PROCTO-MED HC 2.5 % rectal cream [Pharmacy Med Name: Procto-Med HC 2.5 % External Cream] 28 g 0    Sig: INSERT  CREAM RECTALLY TO AFFECTED AREA TWICE DAILY     Off-Protocol Failed - 10/16/2022  2:39 PM      Failed - Medication not assigned to a protocol, review manually.      Passed - Valid encounter within last 12 months    Recent Outpatient Visits           1 month ago Essential (primary) hypertension   [facility] Malva Limes, MD   3 months ago Hypothyroidism, unspecified type   [facility] Malva Limes, MD   7 months ago Type 2 diabetes mellitus with vascular disease [facility])   [place] [facility] Malva Limes, MD   11 months ago Type 2 diabetes mellitus with vascular disease (HCC)   [facility] Malva Limes, MD   1 year ago Essential (primary) hypertension   [place] [facility] Malva Limes, MD       Future Appointments             In 3 weeks Sherrie Mustache, Demetrios Isaacs, MD [facility]           Not Delegated - Over the Counter: OTC 2 Failed - 10/16/2022  2:39 PM      Failed - This refill cannot be delegated      Passed - Valid encounter within last 12 months    Recent Outpatient Visits           1 month ago Essential (primary) hypertension   [place] [facility] Malva Limes, MD   3 months ago Hypothyroidism, unspecified type   [facility] Malva Limes, MD   7 months ago Type 2 diabetes mellitus with vascular disease [facility])   [facility]  Malva Limes, MD   11 months ago Type 2 diabetes mellitus with vascular disease [facility])   [facility] Malva Limes, MD   1 year ago Essential (primary) hypertension   [facility] Malva Limes, MD       Future Appointments             In 3 weeks Fisher, Demetrios Isaacs, MD [facility]

## 2022-10-22 ENCOUNTER — Other Ambulatory Visit: Payer: Self-pay | Admitting: Family Medicine

## 2022-10-22 DIAGNOSIS — I10 Essential (primary) hypertension: Secondary | ICD-10-CM

## 2022-11-10 ENCOUNTER — Ambulatory Visit (INDEPENDENT_AMBULATORY_CARE_PROVIDER_SITE_OTHER): Payer: PPO | Admitting: Family Medicine

## 2022-11-10 ENCOUNTER — Encounter: Payer: Self-pay | Admitting: Family Medicine

## 2022-11-10 VITALS — BP 135/59 | HR 74 | Ht 62.0 in | Wt 164.1 lb

## 2022-11-10 DIAGNOSIS — I10 Essential (primary) hypertension: Secondary | ICD-10-CM

## 2022-11-10 DIAGNOSIS — E1159 Type 2 diabetes mellitus with other circulatory complications: Secondary | ICD-10-CM

## 2022-11-10 DIAGNOSIS — E782 Mixed hyperlipidemia: Secondary | ICD-10-CM

## 2022-11-10 DIAGNOSIS — Z794 Long term (current) use of insulin: Secondary | ICD-10-CM | POA: Diagnosis not present

## 2022-11-10 DIAGNOSIS — N1832 Chronic kidney disease, stage 3b: Secondary | ICD-10-CM | POA: Diagnosis not present

## 2022-11-10 DIAGNOSIS — E039 Hypothyroidism, unspecified: Secondary | ICD-10-CM | POA: Diagnosis not present

## 2022-11-10 DIAGNOSIS — I480 Paroxysmal atrial fibrillation: Secondary | ICD-10-CM | POA: Diagnosis not present

## 2022-11-10 NOTE — Patient Instructions (Signed)
.   Please review the attached list of medications and notify my office if there are any errors.   . Please bring all of your medications to every appointment so we can make sure that our medication list is the same as yours.   

## 2022-11-10 NOTE — Progress Notes (Signed)
I,Sha'taria Tyson,acting as a Neurosurgeon for Mila Merry, MD.,have documented all relevant documentation on the behalf of Mila Merry, MD,as directed by  Mila Merry, MD while in the presence of Mila Merry, MD.   Established patient visit   Patient: Destiny Gilbert   DOB: 11/11/1940   83 y.o. Female  MRN: [id number] Visit Date: 11/10/2022  Today's healthcare provider: Mila Merry, MD   Chief Complaint  Patient presents with   Diabetes   Hypertension   Hyperlipidemia   Subjective    HPI  Diabetes Mellitus Type II, follow-up  Lab Results  Component Value Date   HGBA1C 6.4 (H) 06/20/2022   HGBA1C 6.3 (A) 03/07/2022   HGBA1C 6.2 (H) 11/04/2021   Last seen for diabetes 5 months ago.  Management since then includes continuing the same treatment.  Home blood sugar records: fasting range: 120-139  Episodes of hypoglycemia? No    Current insulin regiment: none Most Recent Eye Exam: 2024; [facility]  --------------------------------------------------------------------------------------------------- Hypertension, follow-up  BP Readings from Last 3 Encounters:  09/01/22 (!) 144/72  06/20/22 116/72  03/07/22 133/61   Wt Readings from Last 3 Encounters:  09/01/22 164 lb (74.4 kg)  06/20/22 161 lb 12.8 oz (73.4 kg)  03/31/22 164 lb (74.4 kg)     She was last seen for hypertension 10 weeks ago.  BP at that visit was 144/72. Management since that visit includes continue current treatmnet. Outside blood pressures are not  being checked  She had been having some trouble with swelling, but this has resolved since last visit.  --------------------------------------------------------------------------------------------------- Lipid/Cholesterol, follow-up  Last Lipid Panel: Lab Results  Component Value Date   CHOL 169 06/20/2022   LDLCALC 84 06/20/2022   HDL 55 06/20/2022   TRIG 175 (H) 06/20/2022    She was last seen for this 5 months ago.  Management  since that visit includes continue current treatment.  Symptoms: No appetite changes No foot ulcerations  No chest pain Yes chest pressure/discomfort  No dyspnea No orthopnea  No fatigue Yes lower extremity edema  No palpitations No paroxysmal nocturnal dyspnea  No nausea No numbness or tingling of extremity  No polydipsia No polyuria  No speech difficulty No syncope   She is following a Regular diet. Current exercise: none  Last metabolic panel Lab Results  Component Value Date   GLUCOSE 112 (H) 06/20/2022   NA 143 06/20/2022   K 4.6 06/20/2022   BUN 21 06/20/2022   CREATININE 1.44 (H) 06/20/2022   EGFR 37 (L) 06/20/2022   GFRNONAA 62 01/23/2020   CALCIUM 9.8 06/20/2022   AST 25 06/20/2022   ALT 15 06/20/2022   The ASCVD Risk score (Arnett DK, et al., 2019) failed to calculate for the following reasons:   The 2019 ASCVD risk score is only valid for ages 105 to 36   The patient has a prior MI or stroke diagnosis  ---------------------------------------------------------------------------------------------------  Medications: Outpatient Medications Prior to Visit  Medication Sig   acetaminophen (TYLENOL) 500 MG tablet Take 500 mg by mouth every 6 (six) hours as needed.   albuterol (PROAIR HFA) 108 (90 Base) MCG/ACT inhaler Inhale 2 puffs into the lungs every 6 (six) hours as needed.   amLODipine (NORVASC) 2.5 MG tablet Take 1 tablet (2.5 mg total) by mouth daily.   atorvastatin (LIPITOR) 40 MG tablet Take 1 tablet by mouth once daily   ELIQUIS 5 MG TABS tablet Take 5 mg by mouth every 12 (twelve) hours.  fluticasone-salmeterol (ADVAIR) 250-50 MCG/ACT AEPB Inhale 1 puff into the lungs 2 (two) times daily.   GLUCOSAMINE-CHONDROITIN PO Take by mouth 2 (two) times daily.   guaiFENesin (MUCUS RELIEF PO) Take by mouth.   hydrocortisone (PROCTO-MED HC) 2.5 % rectal cream INSERT  CREAM RECTALLY TO AFFECTED AREA TWICE DAILY   levocetirizine (XYZAL) 5 MG tablet TAKE 1 TABLET BY  MOUTH ONCE DAILY IN THE EVENING   levothyroxine (SYNTHROID) 75 MCG tablet Take 1 tablet (75 mcg total) by mouth daily before breakfast.   loratadine (EQ ALLERGY RELIEF) 10 MG tablet Take 1 tablet (10 mg total) by mouth daily.   metFORMIN (GLUCOPHAGE-XR) 500 MG 24 hr tablet TAKE 1 TABLET BY MOUTH ONCE DAILY FOR BLOOD SUGAR   nitroGLYCERIN (NITROSTAT) 0.3 MG SL tablet Place 1 tablet (0.3 mg total) under the tongue every 5 (five) minutes as needed for chest pain.   ONETOUCH ULTRA test strip USE 1 STRIP TO CHECK GLUCOSE ONCE DAILY   potassium chloride (KLOR-CON) 10 MEQ tablet Take 1 tablet (10 mEq total) by mouth 2 (two) times daily.   triamterene-hydrochlorothiazide (MAXZIDE) 75-50 MG tablet Take 1 tablet by mouth once daily   No facility-administered medications prior to visit.    Review of Systems  Constitutional:  Negative for appetite change, chills, fatigue and fever.  Respiratory:  Negative for chest tightness and shortness of breath.   Cardiovascular:  Negative for chest pain and palpitations.  Gastrointestinal:  Negative for abdominal pain, nausea and vomiting.  Neurological:  Negative for dizziness and weakness.       Objective    BP (!) 135/59 (BP Location: Right Arm, Patient Position: Sitting, Cuff Size: Normal)   Pulse 74   Ht 5\' 2"  (1.575 m)   Wt 164 lb 1.6 oz (74.4 kg)   SpO2 97%   BMI 30.01 kg/m    Physical Exam   General: Appearance:    Overweight female in no acute distress  Eyes:    PERRL, conjunctiva/corneas clear, EOM's intact       Lungs:     Clear to auscultation bilaterally, respirations unlabored  Heart:    Normal heart rate. Regular rhythm. No murmurs, rubs, or gallops.    MS:   All extremities are intact.    Neurologic:   Awake, alert, oriented x 3. No apparent focal neurological defect.         Assessment & Plan     1. Type 2 diabetes mellitus with vascular disease (HCC) Doing well on current medications.  - Hemoglobin A1c  2. Chronic kidney  disease, stage 3b (HCC) Encouraged plenty of fluids. Avoid NSAIDs, Tylenol OK - Renal function panel - VITAMIN D 25 Hydroxy (Vit-D Deficiency, Fractures)  3. Hypothyroidism, unspecified type Feels well on current thyroid replacement.  - T4, free - TSH  4. Primary hypertension Fairly well controlled. Continue current medications.   - Magnesium  5. Mixed hyperlipidemia She is tolerating atorvastatin well with no adverse effects.   - Lipid panel  6. Intermittent atrial fibrillation (HCC) Regular rhythm on exam today. Rate normal. On DOAC.  Continue routine cardiology follow ups.      The entirety of the information documented in the History of Present Illness, Review of Systems and Physical Exam were personally obtained by me. Portions of this information were initially documented by the CMA and reviewed by me for thoroughness and accuracy.     Mila Merry, MD  [facility] [phone] (phone) [phone] (fax)  [facility]

## 2022-11-11 LAB — TSH: TSH: 0.713 u[IU]/mL (ref 0.450–4.500)

## 2022-11-11 LAB — RENAL FUNCTION PANEL
Albumin: 4 g/dL (ref 3.8–4.8)
BUN/Creatinine Ratio: 16 (ref 12–28)
BUN: 21 mg/dL (ref 8–27)
CO2: 25 mmol/L (ref 20–29)
Calcium: 9.6 mg/dL (ref 8.7–10.3)
Chloride: 103 mmol/L (ref 96–106)
Creatinine, Ser: 1.33 mg/dL — ABNORMAL HIGH (ref 0.57–1.00)
Glucose: 102 mg/dL — ABNORMAL HIGH (ref 70–99)
Phosphorus: 3.3 mg/dL (ref 3.0–4.3)
Potassium: 4.3 mmol/L (ref 3.5–5.2)
Sodium: 143 mmol/L (ref 134–144)
eGFR: 40 mL/min/{1.73_m2} — ABNORMAL LOW (ref >59)

## 2022-11-11 LAB — LIPID PANEL
Chol/HDL Ratio: 3 ratio (ref 0.0–4.4)
Cholesterol, Total: 159 mg/dL (ref 100–199)
HDL: 53 mg/dL (ref >39)
LDL Chol Calc (NIH): 80 mg/dL (ref 0–99)
Triglycerides: 153 mg/dL — ABNORMAL HIGH (ref 0–149)
VLDL Cholesterol Cal: 26 mg/dL (ref 5–40)

## 2022-11-11 LAB — HEMOGLOBIN A1C
Est. average glucose Bld gHb Est-mCnc: 140 mg/dL
Hgb A1c MFr Bld: 6.5 % — ABNORMAL HIGH (ref 4.8–5.6)

## 2022-11-11 LAB — T4, FREE: Free T4: 1.37 ng/dL (ref 0.82–1.77)

## 2022-11-11 LAB — VITAMIN D 25 HYDROXY (VIT D DEFICIENCY, FRACTURES): Vit D, 25-Hydroxy: 44.9 ng/mL (ref 30.0–100.0)

## 2022-11-11 LAB — MAGNESIUM: Magnesium: 1.7 mg/dL (ref 1.6–2.3)

## 2022-11-18 ENCOUNTER — Other Ambulatory Visit: Payer: Self-pay | Admitting: Family Medicine

## 2022-11-18 DIAGNOSIS — J45909 Unspecified asthma, uncomplicated: Secondary | ICD-10-CM

## 2022-11-18 NOTE — Telephone Encounter (Signed)
Requested Prescriptions  Pending Prescriptions Disp Refills   WIXELA INHUB 250-50 MCG/ACT AEPB [Pharmacy Med Name: Wixela Inhub 250-50 MCG/DOSE Inhalation Aerosol Powder Breath Activated] 180 each 0    Sig: INHALE 1 DOSE BY MOUTH TWICE DAILY     Pulmonology:  Combination Products Passed - 11/18/2022 10:26 AM      Passed - Valid encounter within last 12 months    Recent Outpatient Visits           1 week ago Type 2 diabetes mellitus with vascular disease (HCC)   [facility] Malva Limes, MD   2 months ago Essential (primary) hypertension   [place] [facility] Malva Limes, MD   5 months ago Hypothyroidism, unspecified type   [facility] Malva Limes, MD   8 months ago Type 2 diabetes mellitus with vascular disease [facility])   [place] [facility] Malva Limes, MD   1 year ago Type 2 diabetes mellitus with vascular disease [facility])    [facility] Malva Limes, MD

## 2022-11-25 ENCOUNTER — Telehealth: Payer: Self-pay

## 2022-11-25 ENCOUNTER — Other Ambulatory Visit: Payer: Self-pay

## 2022-11-25 ENCOUNTER — Other Ambulatory Visit: Payer: Self-pay | Admitting: Family Medicine

## 2022-11-25 MED ORDER — LANCETS MISC. MISC: 1.0000 | Freq: Three times a day (TID) | 0 refills | Status: AC

## 2022-11-25 MED ORDER — BLOOD GLUCOSE MONITORING SUPPL DEVI: 1.0000 | Freq: Three times a day (TID) | 0 refills | Status: AC

## 2022-11-25 MED ORDER — BLOOD GLUCOSE TEST VI STRP: 1.0000 | ORAL_STRIP | Freq: Three times a day (TID) | 0 refills | Status: AC

## 2022-11-25 MED ORDER — LANCET DEVICE MISC: 1.0000 | Freq: Three times a day (TID) | 0 refills | Status: AC

## 2022-11-25 NOTE — Telephone Encounter (Signed)
Copied from CRM [id number]. Topic: General - Other >> Nov 25, 2022  3:03 PM Carrielelia G wrote: Patient would like an new machine OneTouch Ultra 2 Blood Glucose Monitoring System

## 2022-11-25 NOTE — Telephone Encounter (Signed)
Duplicate request- filled today Requested Prescriptions  Pending Prescriptions Disp Refills   ONETOUCH ULTRA test strip [Pharmacy Med Name: OneTouch Ultra Blue In Vitro Strip] 100 each 0    Sig: USE 1 STRIP TO CHECK GLUCOSE ONCE DAILY     Endocrinology: Diabetes - Testing Supplies Passed - 11/25/2022  8:45 AM      Passed - Valid encounter within last 12 months    Recent Outpatient Visits           2 weeks ago Type 2 diabetes mellitus with vascular disease (HCC)   [place] [facility] Malva Limes, MD   2 months ago Essential (primary) hypertension   [facility] Malva Limes, MD   5 months ago Hypothyroidism, unspecified type   [facility] Malva Limes, MD   8 months ago Type 2 diabetes mellitus with vascular disease [facility])   [place] [facility] Malva Limes, MD   1 year ago Type 2 diabetes mellitus with vascular disease [facility])   [facility] Sherrie Mustache, Demetrios Isaacs, MD

## 2022-12-02 DIAGNOSIS — I63532 Cerebral infarction due to unspecified occlusion or stenosis of left posterior cerebral artery: Secondary | ICD-10-CM | POA: Diagnosis not present

## 2022-12-02 DIAGNOSIS — I480 Paroxysmal atrial fibrillation: Secondary | ICD-10-CM | POA: Diagnosis not present

## 2022-12-02 DIAGNOSIS — R011 Cardiac murmur, unspecified: Secondary | ICD-10-CM | POA: Diagnosis not present

## 2022-12-02 DIAGNOSIS — I10 Essential (primary) hypertension: Secondary | ICD-10-CM | POA: Diagnosis not present

## 2022-12-02 DIAGNOSIS — E7800 Pure hypercholesterolemia, unspecified: Secondary | ICD-10-CM | POA: Diagnosis not present

## 2022-12-11 ENCOUNTER — Other Ambulatory Visit: Payer: Self-pay | Admitting: Family Medicine

## 2022-12-11 DIAGNOSIS — J309 Allergic rhinitis, unspecified: Secondary | ICD-10-CM

## 2022-12-11 DIAGNOSIS — E039 Hypothyroidism, unspecified: Secondary | ICD-10-CM

## 2022-12-11 DIAGNOSIS — J45909 Unspecified asthma, uncomplicated: Secondary | ICD-10-CM

## 2022-12-11 NOTE — Telephone Encounter (Signed)
Requested Prescriptions  Pending Prescriptions Disp Refills   levothyroxine (SYNTHROID) 75 MCG tablet [Pharmacy Med Name: Levothyroxine Sodium 75 MCG Oral Tablet] 90 tablet 0    Sig: TAKE 1 TABLET BY MOUTH ONCE DAILY IN THE MORNING BEFORE BREAKFAST     Endocrinology:  Hypothyroid Agents Passed - 12/11/2022 10:15 AM      Passed - TSH in normal range and within 360 days    TSH  Date Value Ref Range Status  11/10/2022 0.713 0.450 - 4.500 uIU/mL Final         Passed - Valid encounter within last 12 months    Recent Outpatient Visits           1 month ago Type 2 diabetes mellitus with vascular disease (HCC)   [place] [facility] Malva Limes, MD   3 months ago Essential (primary) hypertension   [place] [facility] Malva Limes, MD   5 months ago Hypothyroidism, unspecified type   [facility] Malva Limes, MD   9 months ago Type 2 diabetes mellitus with vascular disease (HCC)   [facility] Malva Limes, MD   1 year ago Type 2 diabetes mellitus with vascular disease (HCC)   [place] [facility] Fisher, Demetrios Isaacs, MD               levocetirizine (XYZAL) 5 MG tablet [Pharmacy Med Name: Levocetirizine Dihydrochloride 5 MG Oral Tablet] 90 tablet 0    Sig: TAKE 1 TABLET BY MOUTH ONCE DAILY IN THE EVENING     Ear, Nose, and Throat:  Antihistamines - levocetirizine dihydrochloride Failed - 12/11/2022 10:15 AM      Failed - Cr in normal range and within 360 days    Creatinine  Date Value Ref Range Status  09/13/2013 1.18 0.60 - 1.30 mg/dL Final   Creatinine, Ser  Date Value Ref Range Status  11/10/2022 1.33 (H) 0.57 - 1.00 mg/dL Final   Creatinine, POC  Date Value Ref Range Status  09/11/2016 n/a mg/dL Final         Passed - eGFR is 10 or above and within 360 days    EGFR (African American)  Date Value Ref Range Status  09/13/2013 54 (L)  Final   GFR calc  Af Amer  Date Value Ref Range Status  01/23/2020 71 >59 mL/min/1.73 Final    Comment:    **[facility] currently reports eGFR in compliance with the current**   recommendations of the [employer]. [facility] will   update reporting as new guidelines are published from the NKF-ASN   Task force.    EGFR (Non-African Amer.)  Date Value Ref Range Status  09/13/2013 47 (L)  Final    Comment:    eGFR values <50mL/min/1.73 m2 may be an indication of chronic kidney disease (CKD). Calculated eGFR is useful in patients with stable renal function. The eGFR calculation will not be reliable in acutely ill patients when serum creatinine is changing rapidly. It is not useful in  patients on dialysis. The eGFR calculation may not be applicable to patients at the low and high extremes of body sizes, pregnant women, and vegetarians.    GFR calc non Af Amer  Date Value Ref Range Status  01/23/2020 62 >59 mL/min/1.73 Final   eGFR  Date Value Ref Range Status  11/10/2022 40 (L) >59 mL/min/1.73 Final         Passed - Valid encounter  within last 12 months    Recent Outpatient Visits           1 month ago Type 2 diabetes mellitus with vascular disease (HCC)   [facility] [facility] Malva Limes, MD   3 months ago Essential (primary) hypertension   [facility] [facility] Malva Limes, MD   5 months ago Hypothyroidism, unspecified type   [facility] Malva Limes, MD   9 months ago Type 2 diabetes mellitus with vascular disease [facility])   [place] [facility] Malva Limes, MD   1 year ago Type 2 diabetes mellitus with vascular disease [facility])   [place] [facility] Malva Limes, MD

## 2022-12-15 DIAGNOSIS — R011 Cardiac murmur, unspecified: Secondary | ICD-10-CM | POA: Diagnosis not present

## 2022-12-15 DIAGNOSIS — I480 Paroxysmal atrial fibrillation: Secondary | ICD-10-CM | POA: Diagnosis not present

## 2022-12-29 DIAGNOSIS — R1310 Dysphagia, unspecified: Secondary | ICD-10-CM | POA: Diagnosis not present

## 2022-12-29 DIAGNOSIS — Z7901 Long term (current) use of anticoagulants: Secondary | ICD-10-CM | POA: Diagnosis not present

## 2022-12-29 DIAGNOSIS — K219 Gastro-esophageal reflux disease without esophagitis: Secondary | ICD-10-CM | POA: Diagnosis not present

## 2023-02-25 ENCOUNTER — Ambulatory Visit (INDEPENDENT_AMBULATORY_CARE_PROVIDER_SITE_OTHER): Payer: PPO

## 2023-02-25 VITALS — Ht 62.0 in | Wt 164.0 lb

## 2023-02-25 DIAGNOSIS — Z0000 Encounter for general adult medical examination without abnormal findings: Secondary | ICD-10-CM | POA: Diagnosis not present

## 2023-02-25 NOTE — Patient Instructions (Signed)
Ms. Destiny Gilbert , Thank you for taking time to come for your Medicare Wellness Visit. I appreciate your ongoing commitment to your health goals. Please review the following plan we discussed and let me know if I can assist you in the future.   Referrals/Orders/Follow-Ups/Clinician Recommendations: none  This is a list of the screening recommended for you and due dates:  Health Maintenance  Topic Date Due   DTaP/Tdap/Td vaccine (2 - Td or Tdap) 05/02/2017   COVID-19 Vaccine (4 - 2023-24 season) 03/07/2022   Yearly kidney health urinalysis for diabetes  11/05/2022   DEXA scan (bone density measurement)  02/04/2023   Flu Shot  02/05/2023   Hemoglobin A1C  05/13/2023   Eye exam for diabetics  10/08/2023   Yearly kidney function blood test for diabetes  11/10/2023   Medicare Annual Wellness Visit  02/25/2024   Pneumonia Vaccine  Completed   Zoster (Shingles) Vaccine  Completed   HPV Vaccine  Aged Out   Colon Cancer Screening  Discontinued    Advanced directives: (Declined) Advance directive discussed with you today. Even though you declined this today, please call our office should you change your mind, and we can give you the proper paperwork for you to fill out.  Next Medicare Annual Wellness Visit scheduled for next year: Yes 03/01/2024 @ 10:45am telephone

## 2023-02-25 NOTE — Progress Notes (Signed)
Subjective:   Destiny Gilbert is a 82 y.o. female who presents for Medicare Annual (Subsequent) preventive examination.  Visit Complete: Virtual  I connected with  Destiny Gilbert on 02/25/23 by a audio enabled telemedicine application and verified that I am speaking with the correct person using two identifiers.  Patient Location: Home  Provider Location: Office/Clinic  I discussed the limitations of evaluation and management by telemedicine. The patient expressed understanding and agreed to proceed.  Vital Signs: Unable to obtain new vitals due to this being a telehealth visit.  Patient Medicare AWV questionnaire was completed by the patient on (not done); I have confirmed that all information answered by patient is correct and no changes since this date.  Review of Systems    Cardiac Risk Factors include: advanced age (>52men, >9 women);diabetes mellitus;dyslipidemia;obesity (BMI >30kg/m2);hypertension    Objective:    Today's Vitals   02/25/23 1055  Weight: 164 lb (74.4 kg)  Height: 5\' 2"  (1.575 m)   Body mass index is 30 kg/m.     02/25/2023   11:02 AM 03/31/2022    3:05 PM 05/16/2020   11:55 AM 01/24/2020    1:39 PM 01/10/2019    8:22 AM 07/19/2018   11:10 AM 05/06/2018    5:09 PM  Advanced Directives  Does Patient Have a Medical Advance Directive? No No No No No No Yes  Type of Tax inspector;Living will  Copy of Healthcare Power of Attorney in Chart?       No - copy requested  Would patient like information on creating a medical advance directive?  No - Patient declined  No - Patient declined No - Patient declined  No - Patient declined    Current Medications (verified) Outpatient Encounter Medications as of 02/25/2023  Medication Sig   acetaminophen (TYLENOL) 500 MG tablet Take 500 mg by mouth every 6 (six) hours as needed.   albuterol (PROAIR HFA) 108 (90 Base) MCG/ACT inhaler Inhale 2 puffs into the lungs every 6 (six)  hours as needed.   amLODipine (NORVASC) 2.5 MG tablet Take 1 tablet (2.5 mg total) by mouth daily.   atorvastatin (LIPITOR) 40 MG tablet Take 1 tablet by mouth once daily   Blood Glucose Monitoring Suppl DEVI 1 each by Does not apply route in the morning, at noon, and at bedtime. May substitute to any manufacturer covered by patient's insurance.   ELIQUIS 5 MG TABS tablet Take 5 mg by mouth every 12 (twelve) hours.   fluticasone-salmeterol (WIXELA INHUB) 250-50 MCG/ACT AEPB INHALE 1 DOSE BY MOUTH TWICE DAILY   GLUCOSAMINE-CHONDROITIN PO Take by mouth 2 (two) times daily.   guaiFENesin (MUCUS RELIEF PO) Take by mouth.   hydrocortisone (PROCTO-MED HC) 2.5 % rectal cream INSERT  CREAM RECTALLY TO AFFECTED AREA TWICE DAILY   levocetirizine (XYZAL) 5 MG tablet TAKE 1 TABLET BY MOUTH ONCE DAILY IN THE EVENING   levothyroxine (SYNTHROID) 75 MCG tablet TAKE 1 TABLET BY MOUTH ONCE DAILY IN THE MORNING BEFORE BREAKFAST   loratadine (EQ ALLERGY RELIEF) 10 MG tablet Take 1 tablet (10 mg total) by mouth daily.   metFORMIN (GLUCOPHAGE-XR) 500 MG 24 hr tablet TAKE 1 TABLET BY MOUTH ONCE DAILY FOR BLOOD SUGAR   nitroGLYCERIN (NITROSTAT) 0.3 MG SL tablet Place 1 tablet (0.3 mg total) under the tongue every 5 (five) minutes as needed for chest pain.   ONETOUCH ULTRA test strip USE 1 STRIP TO CHECK GLUCOSE  ONCE DAILY   potassium chloride (KLOR-CON) 10 MEQ tablet Take 1 tablet (10 mEq total) by mouth 2 (two) times daily.   triamterene-hydrochlorothiazide (MAXZIDE) 75-50 MG tablet Take 1 tablet by mouth once daily   No facility-administered encounter medications on file as of 02/25/2023.    Allergies (verified) Dipyridamole and Sulfa antibiotics   History: Past Medical History:  Diagnosis Date   Bladder infection    Diabetes mellitus without complication (HCC)    Diabetic acidosis (HCC)    Diverticulosis    History of peptic ulcer    perforated   Hyperlipidemia    Hyperplastic colon polyp     Hypertension    Pneumonia 01/23/2015   [facility]    Stroke [facility])    Past Surgical History:  Procedure Laterality Date   ABDOMINAL HYSTERECTOMY  1994   BSO   CATARACT EXTRACTION     left eye- 09/2014; righr eye- 08/13/2014   COLONOSCOPY WITH PROPOFOL N/A 12/23/2016   Procedure: COLONOSCOPY WITH PROPOFOL;  Surgeon: Christena Deem, MD;  Location: [facility] ENDOSCOPY;  Service: Endoscopy;  Laterality: N/A;   COLONOSCOPY WITH PROPOFOL N/A 05/16/2020   Procedure: COLONOSCOPY WITH PROPOFOL;  Surgeon: Toledo, Boykin Nearing, MD;  Location: [facility] ENDOSCOPY;  Service: Gastroenterology;  Laterality: N/A;   ESOPHAGOGASTRODUODENOSCOPY     ESOPHAGOGASTRODUODENOSCOPY (EGD) WITH PROPOFOL N/A 05/16/2020   Procedure: ESOPHAGOGASTRODUODENOSCOPY (EGD) WITH PROPOFOL;  Surgeon: Toledo, Boykin Nearing, MD;  Location: [facility] ENDOSCOPY;  Service: Gastroenterology;  Laterality: N/A;   INSERTION OF CARDIAC MONITOR     INSERTION OF CARDIAC MONITOR     LOOP RECORDER INSERTION N/A 07/29/2017   Procedure: LOOP RECORDER INSERTION;  Surgeon: Marcina Millard, MD;  Location: [facility] INVASIVE CV LAB;  Service: Cardiovascular;  Laterality: N/A;   Family History  Problem Relation Age of Onset   Lung cancer Mother    Pancreatic cancer Father    Lung cancer Father    Hypertension Sister    Hyperlipidemia Sister    Breast cancer Neg Hx    Social History   Socioeconomic History   Marital status: Married    Spouse name: Not on file   Number of children: 3   Years of education: Not on file   Highest education level: Some college, no degree  Occupational History   Occupation: Retired  Tobacco Use   Smoking status: Never   Smokeless tobacco: Never  Vaping Use   Vaping status: Never Used  Substance and Sexual Activity   Alcohol use: Yes    Alcohol/week: 2.0 standard drinks of alcohol    Types: 2 Glasses of wine per week    Comment: 2 glasses of wine per week    Drug use: No   Sexual activity: Not on file  Other Topics Concern    Not on file  Social History Narrative   Not on file   Social Determinants of Health   Financial Resource Strain: Low Risk  (02/25/2023)   Overall Financial Resource Strain (CARDIA)    Difficulty of Paying Living Expenses: Not hard at all  Food Insecurity: No Food Insecurity (02/25/2023)   Hunger Vital Sign    Worried About Running Out of Food in the Last Year: Never true    Ran Out of Food in the Last Year: Never true  Transportation Needs: No Transportation Needs (02/25/2023)   PRAPARE - Administrator, Civil Service (Medical): No    Lack of Transportation (Non-Medical): No  Physical Activity: Insufficiently Active (02/25/2023)   Exercise Vital Sign  Days of Exercise per Week: 2 days    Minutes of Exercise per Session: 40 min  Stress: No Stress Concern Present (02/25/2023)   [employer] of Occupational Health - Occupational Stress Questionnaire    Feeling of Stress : Not at all  Social Connections: Moderately Integrated (02/25/2023)   Social Connection and Isolation Panel [NHANES]    Frequency of Communication with Friends and Family: More than three times a week    Frequency of Social Gatherings with Friends and Family: Once a week    Attends Religious Services: More than 4 times per year    Active Member of [employer] or Organizations: No    Attends Engineer, structural: Never    Marital Status: Married    Tobacco Counseling Counseling given: Not Answered   Clinical Intake:  Pre-visit preparation completed: Yes  Pain : No/denies pain     BMI - recorded: 30 Nutritional Status: BMI > 30  Obese Nutritional Risks: None Diabetes: No  How often do you need to have someone help you when you read instructions, pamphlets, or other written materials from your doctor or pharmacy?: 1 - Never  Interpreter Needed?: No  Comments: lives with husband Information entered by :: B.Britni Driscoll,LPN   Activities of Daily Living    02/25/2023   11:02 AM 11/10/2022     9:01 AM  In your present state of health, do you have any difficulty performing the following activities:  Hearing? 0 0  Vision? 0 0  Difficulty concentrating or making decisions? 0 0  Walking or climbing stairs? 0 0  Dressing or bathing? 0 0  Doing errands, shopping? 0 0  Preparing Food and eating ? N   Using the Toilet? N   In the past six months, have you accidently leaked urine? N   Do you have problems with loss of bowel control? N   Managing your Medications? N   Managing your Finances? N   Housekeeping or managing your Housekeeping? N     Patient Care Team: Malva Limes, MD as PCP - General (Family Medicine) Marcina Millard, MD as Consulting Physician (Cardiology) Morene Crocker, MD as Referring Physician (Neurology) Geanie Logan, MD as Referring Physician (Otolaryngology) Lockie Mola, MD as Referring Physician (Ophthalmology) Stanton Kidney, MD as Consulting Physician (Gastroenterology) Jaynie Collins, DO as Consulting Physician (Gastroenterology)  Indicate any recent Medical Services you may have received from other than [facility] providers in the past year (date may be approximate).     Assessment:   This is a routine wellness examination for Destiny Gilbert.  Hearing/Vision screen Hearing Screening - Comments:: Adequate hearing Vision Screening - Comments:: Adequate vision w/glasses Dr Inez Pilgrim  Dietary issues and exercise activities discussed:     Goals Addressed             This Visit's Progress    DIET - EAT MORE FRUITS AND VEGETABLES   On track    DIET - REDUCE SUGAR INTAKE   On track    Recommend to cut back on sugar and sweets in daily diet and subsitute for healthier snack.      Exercise 3x per week (30 min per time)   On track    Recommend to exercise for 3 days a week for at least 30 minutes at a time.      LIFESTYLE - DECREASE FALLS RISK   On track    Recommend to remove any items from the home that may cause slips or  trips.  Depression Screen    02/25/2023   11:00 AM 11/10/2022    9:01 AM 03/31/2022    3:03 PM 03/07/2022    8:45 AM 11/04/2021    8:36 AM 03/27/2021   10:18 AM 10/24/2020    8:36 AM  PHQ 2/9 Scores  PHQ - 2 Score 0 0 0 0 0 0 0  PHQ- 9 Score  1 0 3 0 1 0    Fall Risk    02/25/2023   10:57 AM 11/10/2022    9:01 AM 03/31/2022    3:05 PM 03/07/2022    8:45 AM 11/04/2021    8:35 AM  Fall Risk   Falls in the past year? 0 0 0 0 0  Number falls in past yr: 0 0 0 0 0  Injury with Fall? 0 0 0 0 0  Risk for fall due to : No Fall Risks No Fall Risks No Fall Risks    Follow up Education provided;Falls prevention discussed Falls evaluation completed Falls prevention discussed;Falls evaluation completed Falls evaluation completed Falls evaluation completed    MEDICARE RISK AT HOME: Medicare Risk at Home Any stairs in or around the home?: No If so, are there any without handrails?: No Home free of loose throw rugs in walkways, pet beds, electrical cords, etc?: Yes Adequate lighting in your home to reduce risk of falls?: Yes Life alert?: No Use of a cane, walker or w/c?: No Grab bars in the bathroom?: No Shower chair or bench in shower?: No Elevated toilet seat or a handicapped toilet?: No  TIMED UP AND GO:  Was the test performed?  No    Cognitive Function:        02/25/2023   11:04 AM 03/31/2022    3:07 PM 03/27/2021   10:16 AM 01/24/2020    1:45 PM 05/27/2017    9:16 AM  6CIT Screen  What Year? 0 points 0 points 0 points 0 points 0 points  What month? 0 points 0 points 0 points 0 points 0 points  What time? 0 points 0 points 0 points 0 points 0 points  Count back from 20 0 points 0 points 0 points 0 points 0 points  Months in reverse 0 points 0 points 0 points 0 points 0 points  Repeat phrase 0 points 2 points 2 points 2 points 2 points  Total Score 0 points 2 points 2 points 2 points 2 points    Immunizations Immunization History  Administered Date(s) Administered   Fluad  Quad(high Dose 65+) 03/27/2021   Influenza, High Dose Seasonal PF 03/18/2016, 03/27/2017, 03/29/2018, 04/05/2019, 04/10/2020   Influenza,inj,Quad PF,6+ Mos 05/17/2015   PFIZER(Purple Top)SARS-COV-2 Vaccination 07/14/2019, 08/04/2019, 04/19/2020   Pneumococcal Conjugate-13 05/20/2016   Pneumococcal Polysaccharide-23 04/12/2008, 09/12/2013   Tdap 05/03/2007   Zoster Recombinant(Shingrix) 05/18/2019, 08/24/2019   Zoster, Live 06/12/2009    TDAP status: Up to date  Flu Vaccine status: Up to date  Pneumococcal vaccine status: Up to date  Covid-19 vaccine status: Completed vaccines  Qualifies for Shingles Vaccine? Yes   Zostavax completed Yes   Shingrix Completed?: Yes  Screening Tests Health Maintenance  Topic Date Due   DTaP/Tdap/Td (2 - Td or Tdap) 05/02/2017   COVID-19 Vaccine (4 - 2023-24 season) 03/07/2022   Diabetic kidney evaluation - Urine ACR  11/05/2022   DEXA SCAN  02/04/2023   INFLUENZA VACCINE  02/05/2023   HEMOGLOBIN A1C  05/13/2023   OPHTHALMOLOGY EXAM  10/08/2023   Diabetic kidney evaluation - eGFR measurement  11/10/2023   Medicare Annual Wellness (AWV)  02/25/2024   Pneumonia Vaccine 63+ Years old  Completed   Zoster Vaccines- Shingrix  Completed   HPV VACCINES  Aged Out   Colonoscopy  Discontinued    Health Maintenance  Health Maintenance Due  Topic Date Due   DTaP/Tdap/Td (2 - Td or Tdap) 05/02/2017   COVID-19 Vaccine (4 - 2023-24 season) 03/07/2022   Diabetic kidney evaluation - Urine ACR  11/05/2022   DEXA SCAN  02/04/2023   INFLUENZA VACCINE  02/05/2023    Colorectal cancer screening: No longer required.   Mammogram status: No longer required due to age.  Bone Density status: Completed yes. Results reflect: Bone density results: NORMAL. Repeat every yes years.  Lung Cancer Screening: (Low Dose CT Chest recommended if Age 60-80 years, 20 pack-year currently smoking OR have quit w/in 15years.) does not qualify.   Lung Cancer Screening  Referral: no  Additional Screening:  Hepatitis C Screening: does not qualify; Completed yes  Vision Screening: Recommended annual ophthalmology exams for early detection of glaucoma and other disorders of the eye. Is the patient up to date with their annual eye exam?  Yes  Who is the provider or what is the name of the office in which the patient attends annual eye exams? Dr Inez Pilgrim If pt is not established with a provider, would they like to be referred to a provider to establish care? No .   Dental Screening: Recommended annual dental exams for proper oral hygiene  Diabetic Foot Exam: Diabetic Foot Exam: Overdue, Pt has been advised about the importance in completing this exam. Pt is scheduled for diabetic foot exam on next visit with PCP.  Community Resource Referral / Chronic Care Management: CRR required this visit?  No   CCM required this visit?  No     Plan:     I have personally reviewed and noted the following in the patient's chart:   Medical and social history Use of alcohol, tobacco or illicit drugs  Current medications and supplements including opioid prescriptions. Patient is not currently taking opioid prescriptions. Functional ability and status Nutritional status Physical activity Advanced directives List of other physicians Hospitalizations, surgeries, and ER visits in previous 12 months Vitals Screenings to include cognitive, depression, and falls Referrals and appointments  In addition, I have reviewed and discussed with patient certain preventive protocols, quality metrics, and best practice recommendations. A written personalized care plan for preventive services as well as general preventive health recommendations were provided to patient.     Sue Lush, LPN   1/61/0960   After Visit Summary: (MyChart) Due to this being a telephonic visit, the after visit summary with patients personalized plan was offered to patient via MyChart    Nurse Notes: The patient states she is doing well and has no concerns or questions at this time.

## 2023-03-12 ENCOUNTER — Ambulatory Visit: Payer: Self-pay | Admitting: *Deleted

## 2023-03-12 ENCOUNTER — Encounter: Payer: Self-pay | Admitting: Family Medicine

## 2023-03-12 ENCOUNTER — Ambulatory Visit (INDEPENDENT_AMBULATORY_CARE_PROVIDER_SITE_OTHER): Payer: PPO | Admitting: Family Medicine

## 2023-03-12 VITALS — BP 130/67 | HR 73 | Temp 97.7°F | Resp 12 | Ht 62.0 in | Wt 162.3 lb

## 2023-03-12 DIAGNOSIS — M545 Low back pain, unspecified: Secondary | ICD-10-CM

## 2023-03-12 MED ORDER — CYCLOBENZAPRINE HCL 5 MG PO TABS: 5.0000 mg | ORAL_TABLET | Freq: Three times a day (TID) | ORAL | 1 refills | Status: DC | PRN

## 2023-03-12 NOTE — Telephone Encounter (Signed)
Reason for Disposition  [1] MODERATE back pain (e.g., interferes with normal activities) AND [2] present > 3 days  Answer Assessment - Initial Assessment Questions 1. ONSET: "When did the pain begin?"      I pulled weeds Tues. And pulled my back.   I'm taking Tylenol and using the heating pad.   The patch did not help.   What can I use to help the pain? I take blood thinner.   I'm not supposed to take Motrin, etc.      A couple of years ago I did this same thing with my back pulling weeds.   Dr. Sherrie Mustache gave me Cyclobenzaprine 5 mg  It's my lower back.    It's 6 inches from my waist going down. 2. LOCATION: "Where does it hurt?" (upper, mid or lower back)     Lower back. 3. SEVERITY: "How bad is the pain?"  (e.g., Scale 1-10; mild, moderate, or severe)   - MILD (1-3): Doesn't interfere with normal activities.    - MODERATE (4-7): Interferes with normal activities or awakens from sleep.    - SEVERE (8-10): Excruciating pain, unable to do any normal activities.      8/10 when I got up.    I took Tylenol and it's 3/10 now.     4. PATTERN: "Is the pain constant?" (e.g., yes, no; constant, intermittent)      Constant pain 5. RADIATION: "Does the pain shoot into your legs or somewhere else?"     No 6. CAUSE:  "What do you think is causing the back pain?"      I pulled something in my lower back while pulling Weeds. 7. BACK OVERUSE:  "Any recent lifting of heavy objects, strenuous work or exercise?"     Pulling weeds 8. MEDICINES: "What have you taken so far for the pain?" (e.g., nothing, acetaminophen, NSAIDS)     I'm using Tylenol and a heating pad which is helping the pain. 9. NEUROLOGIC SYMPTOMS: "Do you have any weakness, numbness, or problems with bowel/bladder control?"     No 10. OTHER SYMPTOMS: "Do you have any other symptoms?" (e.g., fever, abdomen pain, burning with urination, blood in urine)       No 11. PREGNANCY: "Is there any chance you are pregnant?" "When was your last  menstrual period?"       N/A due to age  Protocols used: Back Pain-A-AH

## 2023-03-12 NOTE — Telephone Encounter (Signed)
  Chief Complaint: Lower back pain from pulling weeds on Tues.  On blood thinners so can't take NSAIDs.   Tylenol and heating pad helping but still having pain.   Requesting a muscle relaxer. Symptoms: Lower back pain without pain down either leg Frequency: Since Tues. Pertinent Negatives: Patient denies pain down either leg. Disposition: [] ED /[] Urgent Care (no appt availability in office) / [x] Appointment(In office/virtual)/ []  [place] [facility]/ [] Home Care/ [] Refused Recommended Disposition /[] [facility]/ []  Follow-up with PCP Additional Notes: Appt made with Dr. Payton Mccallum for today at 10:40.

## 2023-03-12 NOTE — Patient Instructions (Addendum)
Take 1 extra strength (500 mg) Tylenol every 4-6 hours for up to 5 days.  Do not wait for the pain to occur to take your next dose.  - if regular-strength (325 mg) Tylenol at home, you may take 2 tablets every 5-6 hours for up to 5 days May stop if pain is not returning between doses, after three days.  I also encourage you to do squats (while holding onto something)

## 2023-03-12 NOTE — Progress Notes (Signed)
Established patient visit   Patient: Destiny Gilbert   DOB: [birth year]   82 y.o. Female  MRN: [id number] Visit Date: 03/12/2023  Today's healthcare provider: Sherlyn Hay, DO   Chief Complaint  Patient presents with   Back Pain   Subjective    HPI Patient has been experiencing lower back pain since Tuesday, after taking up weeds and chopping things with a hoe (working hard to get roots out of the grass).  While working, her pain started, but she continued to work another hour before going inside.   - Didn't hear anything pop or similar, just began experiencing an aching pain.  - She used the heating pad that evening and felt better the next day. - Has leftover cyclobenzaprine 5 mg and took one yesterday morning when she got up, around 4pm yesterday and one around 2am this morning  - Got up this morning to use the bathroom around 5am and experienced 10/10 pain.  Took two 200 mg OTC motrin and sat with heating pad for one hour. Pain is down 5/10.  - The pain is worst today.      Medications: Outpatient Medications Prior to Visit  Medication Sig   acetaminophen (TYLENOL) 500 MG tablet Take 500 mg by mouth every 6 (six) hours as needed.   albuterol (PROAIR HFA) 108 (90 Base) MCG/ACT inhaler Inhale 2 puffs into the lungs every 6 (six) hours as needed.   amLODipine (NORVASC) 2.5 MG tablet Take 1 tablet (2.5 mg total) by mouth daily.   atorvastatin (LIPITOR) 40 MG tablet Take 1 tablet by mouth once daily   Blood Glucose Monitoring Suppl DEVI 1 each by Does not apply route in the morning, at noon, and at bedtime. May substitute to any manufacturer covered by patient's insurance.   ELIQUIS 5 MG TABS tablet Take 5 mg by mouth every 12 (twelve) hours.   fluticasone-salmeterol (WIXELA INHUB) 250-50 MCG/ACT AEPB INHALE 1 DOSE BY MOUTH TWICE DAILY   GLUCOSAMINE-CHONDROITIN PO Take by mouth 2 (two) times daily.   guaiFENesin (MUCUS RELIEF PO) Take by mouth.   hydrocortisone (PROCTO-MED  HC) 2.5 % rectal cream INSERT  CREAM RECTALLY TO AFFECTED AREA TWICE DAILY   loratadine (EQ ALLERGY RELIEF) 10 MG tablet Take 1 tablet (10 mg total) by mouth daily.   metFORMIN (GLUCOPHAGE-XR) 500 MG 24 hr tablet TAKE 1 TABLET BY MOUTH ONCE DAILY FOR BLOOD SUGAR   nitroGLYCERIN (NITROSTAT) 0.3 MG SL tablet Place 1 tablet (0.3 mg total) under the tongue every 5 (five) minutes as needed for chest pain.   ONETOUCH ULTRA test strip USE 1 STRIP TO CHECK GLUCOSE ONCE DAILY   triamterene-hydrochlorothiazide (MAXZIDE) 75-50 MG tablet Take 1 tablet by mouth once daily   [DISCONTINUED] levocetirizine (XYZAL) 5 MG tablet TAKE 1 TABLET BY MOUTH ONCE DAILY IN THE EVENING   [DISCONTINUED] levothyroxine (SYNTHROID) 75 MCG tablet TAKE 1 TABLET BY MOUTH ONCE DAILY IN THE MORNING BEFORE BREAKFAST   [DISCONTINUED] potassium chloride (KLOR-CON) 10 MEQ tablet Take 1 tablet (10 mEq total) by mouth 2 (two) times daily.   No facility-administered medications prior to visit.    Review of Systems  Constitutional:  Negative for appetite change, chills, fatigue and fever.  Respiratory:  Negative for chest tightness and shortness of breath.   Cardiovascular:  Negative for chest pain and palpitations.  Gastrointestinal:  Negative for abdominal pain, nausea and vomiting.  Musculoskeletal:  Positive for back pain.  Neurological:  Negative for dizziness, syncope,  weakness and numbness.         Objective    BP 130/67 (BP Location: Right Arm, Patient Position: Sitting, Cuff Size: Normal)   Pulse 73   Temp 97.7 F (36.5 C) (Temporal)   Resp 12   Ht 5\' 2"  (1.575 m)   Wt 162 lb 4.8 oz (73.6 kg)   SpO2 98%   BMI 29.69 kg/m     Physical Exam Vitals and nursing note reviewed.  Constitutional:      General: She is not in acute distress.    Appearance: Normal appearance.  HENT:     Head: Normocephalic and atraumatic.  Eyes:     General: No scleral icterus.    Conjunctiva/sclera: Conjunctivae normal.   Cardiovascular:     Rate and Rhythm: Normal rate.  Pulmonary:     Effort: Pulmonary effort is normal.  Musculoskeletal:        General: Tenderness (left side lumbar back) present.  Neurological:     Mental Status: She is alert and oriented to person, place, and time. Mental status is at baseline.  Psychiatric:        Mood and Affect: Mood normal.        Behavior: Behavior normal.      No results found for any visits on 03/12/23.  Assessment & Plan    Acute left-sided low back pain without sciatica -     Cyclobenzaprine HCl; Take 1 tablet (5 mg total) by mouth 3 (three) times daily as needed for muscle spasms.  Dispense: 30 tablet; Refill: 1  Suspect overuse injury due to recent gardening. Advised patient to take 1 extra strength (500 mg) Tylenol every 4-6 hours for up to 5 days (with a minimum of 3) in order to target the inflammation and allow the area to heal.  Also prescribed cyclobenzaprine as noted above, particularly to facilitate sleep if her pain is preventing.  Advised patient to avoid taking this medication prior to driving.  Also gave patient a handout demonstrating exercises she can do to strengthen her back, to both reduce the pain and help prevent it from happening again in the future.   Return if symptoms worsen or fail to improve.      I discussed the assessment and treatment plan with the patient  The patient was provided an opportunity to ask questions and all were answered. The patient agreed with the plan and demonstrated an understanding of the instructions.   The patient was advised to call back or seek an in-person evaluation if the symptoms worsen or if the condition fails to improve as anticipated.    Sherlyn Hay, DO  [facility] [phone] (phone) [phone] (fax)  [facility]

## 2023-03-18 ENCOUNTER — Other Ambulatory Visit: Payer: Self-pay | Admitting: Family Medicine

## 2023-03-18 DIAGNOSIS — I10 Essential (primary) hypertension: Secondary | ICD-10-CM

## 2023-03-18 DIAGNOSIS — J309 Allergic rhinitis, unspecified: Secondary | ICD-10-CM

## 2023-03-18 DIAGNOSIS — J45909 Unspecified asthma, uncomplicated: Secondary | ICD-10-CM

## 2023-03-19 ENCOUNTER — Other Ambulatory Visit: Payer: Self-pay | Admitting: Family Medicine

## 2023-03-19 DIAGNOSIS — E039 Hypothyroidism, unspecified: Secondary | ICD-10-CM

## 2023-03-24 ENCOUNTER — Encounter: Payer: Self-pay | Admitting: Internal Medicine

## 2023-03-25 ENCOUNTER — Encounter: Payer: Self-pay | Admitting: Internal Medicine

## 2023-03-25 ENCOUNTER — Ambulatory Visit: Payer: PPO | Admitting: Anesthesiology

## 2023-03-25 ENCOUNTER — Encounter
Admission: RE | Disposition: A | Discharge status: Home / Self Care | Payer: Self-pay | Source: Home / Self Care | Attending: Internal Medicine

## 2023-03-25 ENCOUNTER — Other Ambulatory Visit: Payer: Self-pay

## 2023-03-25 ENCOUNTER — Ambulatory Visit
Admission: RE | Admit: 2023-03-25 | Discharge: 2023-03-25 | Disposition: A | Discharge status: Home / Self Care | Payer: PPO | Attending: Internal Medicine | Admitting: Internal Medicine

## 2023-03-25 DIAGNOSIS — Z7901 Long term (current) use of anticoagulants: Secondary | ICD-10-CM | POA: Diagnosis not present

## 2023-03-25 DIAGNOSIS — K2289 Other specified disease of esophagus: Secondary | ICD-10-CM | POA: Diagnosis not present

## 2023-03-25 DIAGNOSIS — K222 Esophageal obstruction: Secondary | ICD-10-CM | POA: Diagnosis not present

## 2023-03-25 DIAGNOSIS — Z7984 Long term (current) use of oral hypoglycemic drugs: Secondary | ICD-10-CM | POA: Diagnosis not present

## 2023-03-25 DIAGNOSIS — R1310 Dysphagia, unspecified: Secondary | ICD-10-CM | POA: Diagnosis not present

## 2023-03-25 DIAGNOSIS — Z86010 Personal history of colonic polyps: Secondary | ICD-10-CM | POA: Insufficient documentation

## 2023-03-25 DIAGNOSIS — I10 Essential (primary) hypertension: Secondary | ICD-10-CM | POA: Diagnosis not present

## 2023-03-25 DIAGNOSIS — Z8673 Personal history of transient ischemic attack (TIA), and cerebral infarction without residual deficits: Secondary | ICD-10-CM | POA: Diagnosis not present

## 2023-03-25 DIAGNOSIS — K219 Gastro-esophageal reflux disease without esophagitis: Secondary | ICD-10-CM | POA: Insufficient documentation

## 2023-03-25 DIAGNOSIS — I4891 Unspecified atrial fibrillation: Secondary | ICD-10-CM | POA: Diagnosis not present

## 2023-03-25 DIAGNOSIS — K5730 Diverticulosis of large intestine without perforation or abscess without bleeding: Secondary | ICD-10-CM | POA: Diagnosis not present

## 2023-03-25 DIAGNOSIS — E785 Hyperlipidemia, unspecified: Secondary | ICD-10-CM | POA: Diagnosis not present

## 2023-03-25 DIAGNOSIS — Z8711 Personal history of peptic ulcer disease: Secondary | ICD-10-CM | POA: Diagnosis not present

## 2023-03-25 DIAGNOSIS — E119 Type 2 diabetes mellitus without complications: Secondary | ICD-10-CM | POA: Diagnosis not present

## 2023-03-25 DIAGNOSIS — K449 Diaphragmatic hernia without obstruction or gangrene: Secondary | ICD-10-CM | POA: Insufficient documentation

## 2023-03-25 HISTORY — PX: ESOPHAGOGASTRODUODENOSCOPY (EGD) WITH PROPOFOL: SHX5813

## 2023-03-25 LAB — GLUCOSE, CAPILLARY: Glucose-Capillary: 90 mg/dL (ref 70–99)

## 2023-03-25 SURGERY — ESOPHAGOGASTRODUODENOSCOPY (EGD) WITH PROPOFOL
Anesthesia: General

## 2023-03-25 MED ORDER — GLYCOPYRROLATE 0.2 MG/ML IJ SOLN
INTRAMUSCULAR | Status: DC | PRN
  Administered 2023-03-25: .2 mg via INTRAVENOUS

## 2023-03-25 MED ORDER — PROPOFOL 500 MG/50ML IV EMUL
INTRAVENOUS | Status: DC | PRN
  Administered 2023-03-25: 70 mg via INTRAVENOUS
  Administered 2023-03-25: 150 ug/kg/min via INTRAVENOUS

## 2023-03-25 MED ORDER — LIDOCAINE HCL (CARDIAC) PF 100 MG/5ML IV SOSY
PREFILLED_SYRINGE | INTRAVENOUS | Status: DC | PRN
  Administered 2023-03-25: 80 mg via INTRAVENOUS

## 2023-03-25 MED ORDER — ONDANSETRON HCL 4 MG/2ML IJ SOLN
INTRAMUSCULAR | Status: DC | PRN
  Administered 2023-03-25: 4 mg via INTRAVENOUS

## 2023-03-25 MED ORDER — SODIUM CHLORIDE 0.9 % IV SOLN: INTRAVENOUS | Status: DC

## 2023-03-25 NOTE — Op Note (Signed)
[facility] Patient Name: Destiny Gilbert Procedure Date: 03/25/2023 12:08 PM MRN: [id number] Account #: [id number] Date of Birth: Nov 22, 1940 Admit Type: Outpatient Age: 82 Room: [facility] ENDO ROOM 2 Gender: Female Note Status: Finalized Instrument Name: Upper Endoscope [id number] Procedure:             Upper GI endoscopy Indications:           Esophageal dysphagia, Suspected gastro-esophageal                         reflux disease Providers:             Boykin Nearing. Norma Fredrickson MD, MD Referring MD:          Demetrios Isaacs. Sherrie Mustache, MD (Referring MD) Medicines:             Propofol per Anesthesia Complications:         No immediate complications. Estimated blood loss:                         Minimal. Procedure:             Pre-Anesthesia Assessment:                        - The risks and benefits of the procedure and the                         sedation options and risks were discussed with the                         patient. All questions were answered and informed                         consent was obtained.                        - Patient identification and proposed procedure were                         verified prior to the procedure by the nurse. The                         procedure was verified in the procedure room.                        - ASA Grade Assessment: III - A patient with severe                         systemic disease.                        - After reviewing the risks and benefits, the patient                         was deemed in satisfactory condition to undergo the                         procedure.                        After obtaining informed consent, the endoscope  was                         passed under direct vision. Throughout the procedure,                         the patient's blood pressure, pulse, and oxygen                         saturations were monitored continuously. The Endoscope                         was introduced  through the mouth, and advanced to the                         third part of duodenum. The upper GI endoscopy was                         somewhat difficult due to stricture. Successful                         completion of the procedure was aided by performing                         the maneuvers documented (below) in this report. The                         patient tolerated the procedure well. Findings:      One benign-appearing, intrinsic moderate stenosis was found at the       gastroesophageal junction. This stenosis measured 1.1 cm (inner       diameter) x less than one cm (in length). The stenosis was traversed. A       TTS dilator was passed through the scope. Dilation with a 12-13.5-15 mm       balloon dilator was performed to 15 mm. The dilation site was examined       following endoscope reinsertion and showed moderate mucosal disruption.       Estimated blood loss was minimal.      Moderate tortuosity of the mid to distal esophagus was noted compatible       with a diagnosis of Presbyesophagus.      The exam of the esophagus was otherwise normal.      A 3 cm hiatal hernia was present.      The exam of the stomach was otherwise normal.      The examined duodenum was normal. Impression:            - Benign-appearing esophageal stenosis. Dilated.                        - 3 cm hiatal hernia.                        - Normal examined duodenum.                        - No specimens collected. Recommendation:        - Patient has a contact number available for  emergencies. The signs and symptoms of potential                         delayed complications were discussed with the patient.                         Return to normal activities tomorrow. Written                         discharge instructions were provided to the patient.                        - Resume previous diet.                        - Continue present medications.                        -  Await pathology results.                        - Monitor results to esophageal dilation                        - Use Protonix (pantoprazole) 40 mg PO daily.                        - Follow up with Tawni Pummel, PA-C at [facility]. [phone].                        - Telephone GI office to schedule appointment in 3                         months. Procedure Code(s):     --- Professional ---                        [phone], Esophagogastroduodenoscopy, flexible,                         transoral; with transendoscopic balloon dilation of                         esophagus (less than 30 mm diameter) Diagnosis Code(s):     --- Professional ---                        R13.14, Dysphagia, pharyngoesophageal phase                        K44.9, Diaphragmatic hernia without obstruction or                         gangrene                        K22.2, Esophageal obstruction CPT copyright 2022 American Medical Association. All rights reserved. The codes documented in this report are preliminary and upon coder review may  be revised to meet current compliance requirements. [employer]  [name] MD, MD 03/25/2023 12:41:39 PM This report has been signed electronically. Number of Addenda: 0 Note Initiated On: 03/25/2023 12:08 PM Estimated Blood Loss:  Estimated blood loss was minimal.      [facility]

## 2023-03-25 NOTE — Anesthesia Postprocedure Evaluation (Signed)
Anesthesia Post Note  Patient: Destiny Gilbert  Procedure(s) Performed: ESOPHAGOGASTRODUODENOSCOPY (EGD) WITH PROPOFOL  Patient location during evaluation: Endoscopy Anesthesia Type: General Level of consciousness: awake and alert Pain management: pain level controlled Vital Signs Assessment: post-procedure vital signs reviewed and stable Respiratory status: spontaneous breathing, nonlabored ventilation, respiratory function stable and patient connected to nasal cannula oxygen Cardiovascular status: blood pressure returned to baseline and stable Postop Assessment: no apparent nausea or vomiting Anesthetic complications: no   No notable events documented.   Last Vitals:  Vitals:   03/25/23 1238 03/25/23 1248  BP: 104/68   Pulse:    Resp:  (!) 21  Temp: 36.6 C   SpO2:  95%    Last Pain:  Vitals:   03/25/23 1248  TempSrc:   PainSc: 0-No pain                 Louie Boston

## 2023-03-25 NOTE — Transfer of Care (Signed)
Immediate Anesthesia Transfer of Care Note  Patient: [name]  Procedure(s) Performed: ESOPHAGOGASTRODUODENOSCOPY (EGD) WITH PROPOFOL  Patient Location: PACU  Anesthesia Type:MAC  Level of Consciousness: drowsy  Airway & Oxygen Therapy: Patient Spontanous Breathing and Patient connected to face mask oxygen  Post-op Assessment: Report given to RN and Post -op Vital signs reviewed and stable  Post vital signs: Reviewed  Last Vitals:  Vitals Value Taken Time  BP 104/68 03/25/23 1238  Temp 36.6 C 03/25/23 1238  Pulse 82 03/25/23 1240  Resp 20 03/25/23 1240  SpO2 95 % 03/25/23 1240  Vitals shown include unfiled device data.  Last Pain:  Vitals:   03/25/23 1238  TempSrc: Temporal  PainSc: Asleep         Complications: No notable events documented.

## 2023-03-25 NOTE — Anesthesia Preprocedure Evaluation (Signed)
Anesthesia Evaluation  Patient identified by MRN, date of birth, ID band Patient awake    Reviewed: Allergy & Precautions, NPO status , Patient's Chart, lab work & pertinent test results  History of Anesthesia Complications Negative for: history of anesthetic complications  Airway Mallampati: III  TM Distance: >3 FB Neck ROM: full    Dental no notable dental hx.    Pulmonary asthma    Pulmonary exam normal        Cardiovascular hypertension, On Medications + dysrhythmias Atrial Fibrillation      Neuro/Psych  Neuromuscular disease CVA  negative psych ROS   GI/Hepatic Neg liver ROS, hiatal hernia, PUD,,,  Endo/Other  diabetesHypothyroidism    Renal/GU negative Renal ROS  negative genitourinary   Musculoskeletal   Abdominal   Peds  Hematology negative hematology ROS (+)   Anesthesia Other Findings Past Medical History: No date: Bladder infection No date: Diabetes mellitus without complication (HCC) No date: Diabetic acidosis (HCC) No date: Diverticulosis No date: History of peptic ulcer     Comment:  perforated No date: Hyperlipidemia No date: Hyperplastic colon polyp No date: Hypertension 01/23/2015: Pneumonia     Comment:  [facility]  No date: Stroke [facility])  Past Surgical History: 1994: ABDOMINAL HYSTERECTOMY     Comment:  BSO No date: CATARACT EXTRACTION     Comment:  left eye- 09/2014; righr eye- 08/13/2014 12/23/2016: COLONOSCOPY WITH PROPOFOL; N/A     Comment:  Procedure: COLONOSCOPY WITH PROPOFOL;  Surgeon:               Christena Deem, MD;  Location: [facility] ENDOSCOPY;                Service: Endoscopy;  Laterality: N/A; 05/16/2020: COLONOSCOPY WITH PROPOFOL; N/A     Comment:  Procedure: COLONOSCOPY WITH PROPOFOL;  Surgeon: [place],               Boykin Nearing, MD;  Location: [facility] ENDOSCOPY;  Service:               Gastroenterology;  Laterality: N/A; No date: ESOPHAGOGASTRODUODENOSCOPY 05/16/2020:  ESOPHAGOGASTRODUODENOSCOPY (EGD) WITH PROPOFOL; N/A     Comment:  Procedure: ESOPHAGOGASTRODUODENOSCOPY (EGD) WITH               PROPOFOL;  Surgeon: [place], Boykin Nearing, MD;  Location:               [facility] ENDOSCOPY;  Service: Gastroenterology;  Laterality:               N/A; No date: INSERTION OF CARDIAC MONITOR No date: INSERTION OF CARDIAC MONITOR 07/29/2017: LOOP RECORDER INSERTION; N/A     Comment:  Procedure: LOOP RECORDER INSERTION;  Surgeon: Marcina Millard, MD;  Location: [facility] INVASIVE CV LAB;  Service:              Cardiovascular;  Laterality: N/A;     Reproductive/Obstetrics negative OB ROS                             Anesthesia Physical Anesthesia Plan  ASA: 3  Anesthesia Plan: General   Post-op Pain Management: Minimal or no pain anticipated   Induction: Intravenous  PONV Risk Score and Plan: 2 and Propofol infusion and TIVA  Airway Management Planned: Natural Airway and Nasal Cannula  Additional Equipment:   Intra-op Plan:   Post-operative Plan:   Informed Consent:  I have reviewed the patients History and Physical, chart, labs and discussed the procedure including the risks, benefits and alternatives for the proposed anesthesia with the patient or authorized representative who has indicated his/her understanding and acceptance.     Dental Advisory Given  Plan Discussed with: Anesthesiologist, CRNA and Surgeon  Anesthesia Plan Comments: (Patient consented for risks of anesthesia including but not limited to:  - adverse reactions to medications - risk of airway placement if required - damage to eyes, teeth, lips or other oral mucosa - nerve damage due to positioning  - sore throat or hoarseness - Damage to heart, brain, nerves, lungs, other parts of body or loss of life  Patient voiced understanding.)        Anesthesia Quick Evaluation

## 2023-03-25 NOTE — H&P (Signed)
Outpatient short stay form Pre-procedure 03/25/2023 11:10 AM Destiny Gilbert K. Norma Fredrickson, [name]  Primary Physician: Mila Merry, [name]  Reason for visit:  Dysphagia, GERD, long term anticiagulation  History of present illness:   EGD 2021-distal esophageal stenosis dilated to 12 mm balloon, 2 cm hiatal hernia. Colonoscopy 2021-grade 3 internal hemorrhoids. Diverticulosis sigmoid. [name] reports having return of dysphagia over the past few years. She reports it occurs intermittently, can happen 2-3 times a week then may not have symptoms for 2 to 3 weeks. She feels that she has to be very careful when she eats and eat very slowly, and make sure she is not talking, etc. Dysphagia is in upper esophageal area and mainly occurs with breads, meats. She denies any liquids or pills dysphagia. She is feeling some heartburn reflux symptoms at times. She previously has been on a PPI but has not been taking recently. She denies any nausea vomiting or epigastric pain. Appetite good & weight stable.   She has regular bowel movement daily, denies significant constipation, diarrhea, melena, rectal bleeding. No lower abd pain. She denies any lower GI symptoms.    Current Facility-Administered Medications:    0.9 %  sodium chloride infusion, , Intravenous, Continuous, [place], Boykin Nearing, MD, Last Rate: 20 mL/hr at 03/25/23 1032, New Bag at 03/25/23 1032  Medications Prior to Admission  Medication Sig Dispense Refill Last Dose   acetaminophen (TYLENOL) 500 MG tablet Take 500 mg by mouth every 6 (six) hours as needed.   03/24/2023   albuterol (PROAIR HFA) 108 (90 Base) MCG/ACT inhaler Inhale 2 puffs into the lungs every 6 (six) hours as needed. 1 Inhaler 2 Past Week   amLODipine (NORVASC) 2.5 MG tablet Take 1 tablet (2.5 mg total) by mouth daily. 90 tablet 4 03/25/2023 at 0530   atorvastatin (LIPITOR) 40 MG tablet Take 1 tablet by mouth once daily 90 tablet 4 03/24/2023   cyclobenzaprine (FLEXERIL) 5 MG tablet Take 1  tablet (5 mg total) by mouth 3 (three) times daily as needed for muscle spasms. 30 tablet 1 Past Week   fluticasone-salmeterol (WIXELA INHUB) 250-50 MCG/ACT AEPB INHALE 1 DOSE BY MOUTH TWICE DAILY 180 each 0 03/24/2023   GLUCOSAMINE-CHONDROITIN PO Take by mouth 2 (two) times daily.   03/24/2023   guaiFENesin (MUCUS RELIEF PO) Take by mouth.   03/24/2023   hydrocortisone (PROCTO-MED HC) 2.5 % rectal cream INSERT  CREAM RECTALLY TO AFFECTED AREA TWICE DAILY 28 g 1 03/24/2023   levocetirizine (XYZAL) 5 MG tablet TAKE 1 TABLET BY MOUTH ONCE DAILY IN THE EVENING 90 tablet 0 03/24/2023   levothyroxine (SYNTHROID) 75 MCG tablet TAKE 1 TABLET BY MOUTH ONCE DAILY IN THE MORNING BEFORE BREAKFAST 90 tablet 0 03/24/2023   loratadine (EQ ALLERGY RELIEF) 10 MG tablet Take 1 tablet (10 mg total) by mouth daily. 30 tablet 1 03/24/2023   metFORMIN (GLUCOPHAGE-XR) 500 MG 24 hr tablet TAKE 1 TABLET BY MOUTH ONCE DAILY FOR BLOOD SUGAR 90 tablet 4 03/24/2023   potassium chloride (KLOR-CON) 10 MEQ tablet Take 1 tablet by mouth twice daily 180 tablet 0 03/24/2023   triamterene-hydrochlorothiazide (MAXZIDE) 75-50 MG tablet Take 1 tablet by mouth once daily 90 tablet 4 03/24/2023   Blood Glucose Monitoring Suppl DEVI 1 each by Does not apply route in the morning, at noon, and at bedtime. May substitute to any manufacturer covered by patient's insurance. 1 each 0    ELIQUIS 5 MG TABS tablet Take 5 mg by mouth every 12 (twelve) hours.  11 03/21/2023   nitroGLYCERIN (NITROSTAT) 0.3 MG SL tablet Place 1 tablet (0.3 mg total) under the tongue every 5 (five) minutes as needed for chest pain. 30 tablet 0    ONETOUCH ULTRA test strip USE 1 STRIP TO CHECK GLUCOSE ONCE DAILY 100 each 12      Allergies  Allergen Reactions   Dipyridamole Nausea Only    Headache   Sulfa Antibiotics Nausea And Vomiting     Past Medical History:  Diagnosis Date   Bladder infection    Diabetes mellitus without complication (HCC)    Diabetic acidosis  (HCC)    Diverticulosis    History of peptic ulcer    perforated   Hyperlipidemia    Hyperplastic colon polyp    Hypertension    Pneumonia 01/23/2015   [facility]    Stroke [facility])     Review of systems:  Otherwise negative.    Physical Exam  Gen: Alert, oriented. Appears stated age.  HEENT: [place]/AT. PERRLA. Lungs: CTA, no wheezes. CV: RR nl S1, S2. Abd: soft, benign, no masses. BS+ Ext: No edema. Pulses 2+    Planned procedures: Proceed with EGD. The patient understands the nature of the planned procedure, indications, risks, alternatives and potential complications including but not limited to bleeding, infection, perforation, damage to internal organs and possible oversedation/side effects from anesthesia. The patient agrees and gives consent to proceed.  Please refer to procedure notes for findings, recommendations and patient disposition/instructions.     Berton Butrick K. Norma Fredrickson, [name] Gastroenterology 03/25/2023  11:10 AM

## 2023-03-25 NOTE — Interval H&P Note (Signed)
History and Physical Interval Note:  03/25/2023 12:19 PM  Destiny Gilbert  has presented today for surgery, with the diagnosis of  787.20 (ICD-9-CM) - R13.10 (ICD-10-CM) - Dysphagia, unspecified type  530.81 (ICD-9-CM) - K21.9 (ICD-10-CM) - Gastroesophageal reflux disease, unspecified whether esophagitis present  V58.61 (ICD-9-CM) - Z79.01 (ICD-10-CM) - Anticoagulant long-term use.  The various methods of treatment have been discussed with the patient and family. After consideration of risks, benefits and other options for treatment, the patient has consented to  Procedure(s): ESOPHAGOGASTRODUODENOSCOPY (EGD) WITH PROPOFOL (N/A) as a surgical intervention.  The patient's history has been reviewed, patient examined, no change in status, stable for surgery.  I have reviewed the patient's chart and labs.  Questions were answered to the patient's satisfaction.     [place], [place]

## 2023-03-26 ENCOUNTER — Encounter: Payer: Self-pay | Admitting: Internal Medicine

## 2023-04-29 DIAGNOSIS — K2100 Gastro-esophageal reflux disease with esophagitis, without bleeding: Secondary | ICD-10-CM | POA: Diagnosis not present

## 2023-04-29 DIAGNOSIS — R1310 Dysphagia, unspecified: Secondary | ICD-10-CM | POA: Diagnosis not present

## 2023-06-21 ENCOUNTER — Other Ambulatory Visit: Payer: Self-pay | Admitting: Family Medicine

## 2023-06-21 DIAGNOSIS — J309 Allergic rhinitis, unspecified: Secondary | ICD-10-CM

## 2023-06-21 DIAGNOSIS — I10 Essential (primary) hypertension: Secondary | ICD-10-CM

## 2023-06-21 DIAGNOSIS — J45909 Unspecified asthma, uncomplicated: Secondary | ICD-10-CM

## 2023-06-23 ENCOUNTER — Other Ambulatory Visit: Payer: Self-pay | Admitting: Family Medicine

## 2023-06-23 DIAGNOSIS — I10 Essential (primary) hypertension: Secondary | ICD-10-CM

## 2023-06-23 NOTE — Telephone Encounter (Signed)
Requested Prescriptions  Pending Prescriptions Disp Refills   fluticasone-salmeterol (ADVAIR) 250-50 MCG/ACT AEPB [Pharmacy Med Name: Fluticasone-Salmeterol 250-50 MCG/DOSE Inhalation Aerosol Powder Breath Activated] 180 each 0    Sig: INHALE 1 DOSE BY MOUTH TWICE DAILY     Pulmonology:  Combination Products Passed - 06/23/2023  7:55 AM      Passed - Valid encounter within last 12 months    Recent Outpatient Visits           3 months ago Acute left-sided low back pain without sciatica   [facility] [place], Sarah N, DO   7 months ago Type 2 diabetes mellitus with vascular disease (HCC)   [place] [facility] Malva Limes, MD   9 months ago Essential (primary) hypertension   [facility] Malva Limes, MD   1 year ago Hypothyroidism, unspecified type   [facility] Malva Limes, MD   1 year ago Type 2 diabetes mellitus with vascular disease (HCC)   [place] [facility] Malva Limes, MD               amLODipine (NORVASC) 2.5 MG tablet [Pharmacy Med Name: amLODIPine Besylate 2.5 MG Oral Tablet] 90 tablet 0    Sig: Take 1 tablet by mouth once daily     Cardiovascular: Calcium Channel Blockers 2 Passed - 06/23/2023  7:55 AM      Passed - Last BP in normal range    BP Readings from Last 1 Encounters:  03/25/23 104/68         Passed - Last Heart Rate in normal range    Pulse Readings from Last 1 Encounters:  03/25/23 81         Passed - Valid encounter within last 6 months    Recent Outpatient Visits           3 months ago Acute left-sided low back pain without sciatica   [facility] [place], Monico Blitz, DO   7 months ago Type 2 diabetes mellitus with vascular disease (HCC)   [place] [facility] Malva Limes, MD   9 months ago Essential (primary) hypertension   [place] [facility]  Malva Limes, MD   1 year ago Hypothyroidism, unspecified type   [facility] Malva Limes, MD   1 year ago Type 2 diabetes mellitus with vascular disease (HCC)   [place] [facility] Sherrie Mustache, Demetrios Isaacs, MD               potassium chloride (KLOR-CON) 10 MEQ tablet [Pharmacy Med Name: Potassium Chloride ER 10 MEQ Oral Tablet Extended Release] 180 tablet 0    Sig: Take 1 tablet by mouth twice daily     Endocrinology:  Minerals - Potassium Supplementation Failed - 06/23/2023  7:55 AM      Failed - Cr in normal range and within 360 days    Creatinine  Date Value Ref Range Status  09/13/2013 1.18 0.60 - 1.30 mg/dL Final   Creatinine, Ser  Date Value Ref Range Status  11/10/2022 1.33 (H) 0.57 - 1.00 mg/dL Final   Creatinine, POC  Date Value Ref Range Status  09/11/2016 n/a mg/dL Final         Passed - K in normal range and within 360 days    Potassium  Date Value Ref Range Status  11/10/2022 4.3 3.5 -  5.2 mmol/L Final  08/03/2014 3.7 3.5 - 5.1 mmol/L Final         Passed - Valid encounter within last 12 months    Recent Outpatient Visits           3 months ago Acute left-sided low back pain without sciatica   [facility], Sarah N, DO   7 months ago Type 2 diabetes mellitus with vascular disease (HCC)   [place] [facility] Malva Limes, MD   9 months ago Essential (primary) hypertension   [place] [facility] Malva Limes, MD   1 year ago Hypothyroidism, unspecified type   [facility] Mal[facility] Limes, MD   1 year ago Type 2 diabetes mellitus with vascular disease (HCC)   [place] [facility] Malva Limes, MD               levocetirizine (XYZAL) 5 MG tablet [Pharmacy Med Name: Levocetirizine Dihydrochloride 5 MG Oral Tablet] 90 tablet 0    Sig: TAKE 1 TABLET BY MOUTH ONCE DAILY IN THE  EVENING     Ear, Nose, and Throat:  Antihistamines - levocetirizine dihydrochloride Failed - 06/23/2023  7:55 AM      Failed - Cr in normal range and within 360 days    Creatinine  Date Value Ref Range Status  09/13/2013 1.18 0.60 - 1.30 mg/dL Final   Creatinine, Ser  Date Value Ref Range Status  11/10/2022 1.33 (H) 0.57 - 1.00 mg/dL Final   Creatinine, POC  Date Value Ref Range Status  09/11/2016 n/a mg/dL Final         Passed - eGFR is 10 or above and within 360 days    EGFR (African American)  Date Value Ref Range Status  09/13/2013 54 (L)  Final   GFR calc Af Amer  Date Value Ref Range Status  01/23/2020 71 >59 mL/min/1.73 Final    Comment:    **[facility] currently reports eGFR in compliance with the current**   recommendations of the [employer]. [facility] will   update reporting as new guidelines are published from the NKF-ASN   Task force.    EGFR (Non-African Amer.)  Date Value Ref Range Status  09/13/2013 47 (L)  Final    Comment:    eGFR values <40mL/min/1.73 m2 may be an indication of chronic kidney disease (CKD). Calculated eGFR is useful in patients with stable renal function. The eGFR calculation will not be reliable in acutely ill patients when serum creatinine is changing rapidly. It is not useful in  patients on dialysis. The eGFR calculation may not be applicable to patients at the low and high extremes of body sizes, pregnant women, and vegetarians.    GFR calc non Af Amer  Date Value Ref Range Status  01/23/2020 62 >59 mL/min/1.73 Final   eGFR  Date Value Ref Range Status  11/10/2022 40 (L) >59 mL/min/1.73 Final         Passed - Valid encounter within last 12 months    Recent Outpatient Visits           3 months ago Acute left-sided low back pain without sciatica   [facility] [place], Monico Blitz, DO   7 months ago Type 2 diabetes mellitus with vascular disease [facility])   [facility] Malva Limes, MD   9 months ago Essential (primary) hypertension   [facility] Sherrie Mustache, Demetrios Isaacs, MD   1 year ago Hypothyroidism, unspecified type   [facility] Malva Limes, MD   1 year ago Type 2 diabetes mellitus with vascular disease [facility])   [place] [facility] Malva Limes, MD

## 2023-06-24 NOTE — Telephone Encounter (Signed)
Reordered 06/12/22 #90 4 RF  Requested Prescriptions  Refused Prescriptions Disp Refills   amLODipine (NORVASC) 2.5 MG tablet [Pharmacy Med Name: amLODIPine Besylate 2.5 MG Oral Tablet] 90 tablet 0    Sig: Take 1 tablet by mouth once daily     Cardiovascular: Calcium Channel Blockers 2 Passed - 06/24/2023  8:21 AM      Passed - Last BP in normal range    BP Readings from Last 1 Encounters:  03/25/23 104/68         Passed - Last Heart Rate in normal range    Pulse Readings from Last 1 Encounters:  03/25/23 81         Passed - Valid encounter within last 6 months    Recent Outpatient Visits           3 months ago Acute left-sided low back pain without sciatica   [facility] [place], Monico Blitz, DO   7 months ago Type 2 diabetes mellitus with vascular disease (HCC)   [place] [facility] Malva Limes, MD   9 months ago Essential (primary) hypertension   [place] [facility] Malva Limes, MD   1 year ago Hypothyroidism, unspecified type   [facility] Malva Limes, MD   1 year ago Type 2 diabetes mellitus with vascular disease [facility])   [place] [facility] Sherrie Mustache, Demetrios Isaacs, MD

## 2023-06-25 ENCOUNTER — Other Ambulatory Visit: Payer: Self-pay | Admitting: Family Medicine

## 2023-06-25 DIAGNOSIS — E039 Hypothyroidism, unspecified: Secondary | ICD-10-CM

## 2023-06-25 NOTE — Telephone Encounter (Signed)
Requested Prescriptions  Pending Prescriptions Disp Refills   levothyroxine (SYNTHROID) 75 MCG tablet [Pharmacy Med Name: Levothyroxine Sodium 75 MCG Oral Tablet] 90 tablet 1    Sig: TAKE 1 TABLET BY MOUTH ONCE DAILY IN THE MORNING BEFORE BREAKFAST     Endocrinology:  Hypothyroid Agents Passed - 06/25/2023 11:06 AM      Passed - TSH in normal range and within 360 days    TSH  Date Value Ref Range Status  11/10/2022 0.713 0.450 - 4.500 uIU/mL Final         Passed - Valid encounter within last 12 months    Recent Outpatient Visits           3 months ago Acute left-sided low back pain without sciatica   [facility] [place] [facility], Monico Blitz, DO   7 months ago Type 2 diabetes mellitus with vascular disease (HCC)   [place] [facility] Malva Limes, MD   9 months ago Essential (primary) hypertension   [facility] Malva Limes, MD   1 year ago Hypothyroidism, unspecified type   [facility] Mal[facility], MD   1 year ago Type 2 diabetes mellitus with vascular disease [facility])   [place] [facility] Sherrie Mustache, Demetrios Isaacs, MD

## 2023-06-29 ENCOUNTER — Other Ambulatory Visit: Payer: Self-pay | Admitting: Family Medicine

## 2023-06-29 DIAGNOSIS — I10 Essential (primary) hypertension: Secondary | ICD-10-CM

## 2023-08-06 ENCOUNTER — Other Ambulatory Visit: Payer: Self-pay | Admitting: Family Medicine

## 2023-08-06 DIAGNOSIS — E782 Mixed hyperlipidemia: Secondary | ICD-10-CM

## 2023-08-07 ENCOUNTER — Ambulatory Visit (INDEPENDENT_AMBULATORY_CARE_PROVIDER_SITE_OTHER): Payer: PPO | Admitting: Family Medicine

## 2023-08-07 ENCOUNTER — Encounter: Payer: Self-pay | Admitting: Family Medicine

## 2023-08-07 ENCOUNTER — Ambulatory Visit: Payer: Self-pay | Admitting: *Deleted

## 2023-08-07 VITALS — BP 146/81 | HR 93 | Temp 97.0°F | Resp 16 | Ht 62.0 in | Wt 166.2 lb

## 2023-08-07 DIAGNOSIS — N644 Mastodynia: Secondary | ICD-10-CM | POA: Diagnosis not present

## 2023-08-07 MED ORDER — DOXYCYCLINE HYCLATE 100 MG PO TABS: 100.0000 mg | ORAL_TABLET | Freq: Two times a day (BID) | ORAL | 0 refills | Status: AC

## 2023-08-07 NOTE — Progress Notes (Unsigned)
Established patient visit   Patient: Destiny Gilbert   DOB: 01/26/1941   83 y.o. Female  MRN: [id number] Visit Date: 08/07/2023  Today's healthcare provider: Mila Merry, MD   Chief Complaint  Patient presents with   Breast Pain    Sore area on left breast above nipple   Subjective    Discussed the use of AI scribe software for clinical note transcription with the patient, who gave verbal consent to proceed.  History of Present Illness   The patient presents with left-sided upper breast pain. She reports onset of left-sided breast pain when she woke up this morning without any preceding soreness or strenuous activity. The pain is present even without touch but intensifies when the area is pressed, radiating around to the nipple. 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 The soreness is described as being 'all over' the area above the implant. No fever, chills, or sweats are present, and she is unsure if there is any swelling. She has not taken any medication like Tylenol for the pain, as it has not been severe enough to warrant it.  She is up to date with her mammograms, with the last one conducted in March of the previous year.  She has been under significant stress since August due to family responsibilities, including frequent travel to assist her sister-in-law, which has contributed to her feeling of low energy.       Medications: Outpatient Medications Prior to Visit  Medication Sig   acetaminophen (TYLENOL) 500 MG tablet Take 500 mg by mouth every 6 (six) hours as needed.   albuterol (PROAIR HFA) 108 (90 Base) MCG/ACT inhaler Inhale 2 puffs into the lungs every 6 (six) hours as needed.   amLODipine (NORVASC) 2.5 MG tablet Take 1 tablet by mouth once daily   atorvastatin (LIPITOR) 40 MG tablet Take 1 tablet by mouth once daily   Blood Glucose Monitoring Suppl DEVI 1 each by Does not apply route in the  morning, at noon, and at bedtime. May substitute to any manufacturer covered by patient's insurance.   cyclobenzaprine (FLEXERIL) 5 MG tablet Take 1 tablet (5 mg total) by mouth 3 (three) times daily as needed for muscle spasms.   ELIQUIS 5 MG TABS tablet Take 5 mg by mouth every 12 (twelve) hours.   fluticasone-salmeterol (ADVAIR) 250-50 MCG/ACT AEPB INHALE 1 DOSE BY MOUTH TWICE DAILY   GLUCOSAMINE-CHONDROITIN PO Take by mouth 2 (two) times daily.   guaiFENesin (MUCUS RELIEF PO) Take by mouth.   hydrocortisone (PROCTO-MED HC) 2.5 % rectal cream INSERT  CREAM RECTALLY TO AFFECTED AREA TWICE DAILY   levocetirizine (XYZAL) 5 MG tablet TAKE 1 TABLET BY MOUTH ONCE DAILY IN THE EVENING   levothyroxine (SYNTHROID) 75 MCG tablet TAKE 1 TABLET BY MOUTH ONCE DAILY IN THE MORNING BEFORE BREAKFAST   loratadine (EQ ALLERGY RELIEF) 10 MG tablet Take 1 tablet (10 mg total) by mouth daily.   metFORMIN (GLUCOPHAGE-XR) 500 MG 24 hr tablet TAKE 1 TABLET BY MOUTH ONCE DAILY FOR BLOOD SUGAR   nitroGLYCERIN (NITROSTAT) 0.3 MG SL tablet Place 1 tablet (0.3 mg total) under the tongue every 5 (five) minutes as needed for chest pain.   ONETOUCH ULTRA test strip USE 1 STRIP TO CHECK GLUCOSE ONCE DAILY   potassium chloride (KLOR-CON) 10 MEQ tablet Take 1 tablet by mouth twice daily   triamterene-hydrochlorothiazide (MAXZIDE) 75-50  MG tablet Take 1 tablet by mouth once daily   No facility-administered medications prior to visit.   Review of Systems  Constitutional:  Positive for fatigue. Negative for appetite change, chills and fever.  Respiratory:  Negative for chest tightness and shortness of breath.   Cardiovascular:  Negative for chest pain and palpitations.  Gastrointestinal:  Negative for abdominal pain, nausea and vomiting.  Neurological:  Negative for dizziness and weakness.   {Insert previous labs (optional):23779} {See past labs  Heme  Chem  Endocrine  Serology  Results Review (optional):1}   Objective     BP (!) 146/81 (BP Location: Left Arm, Patient Position: Sitting, Cuff Size: Large)   Pulse 93   Temp (!) 97 F (36.1 C) (Oral)   Resp 16   Ht 5\' 2"  (1.575 m)   Wt 166 lb 3.2 oz (75.4 kg)   SpO2 98%   BMI 30.40 kg/m {Insert last BP/Wt (optional):23777}{See vitals history (optional):1}   Physical Exam   BREAST: Left breast tenderness upon palpation. Fullness of area on palpation in the area of discomfort which is lateral to cardiac implant. No erythema or open wounds.      Assessment & Plan       Left Breast Pain Sudden onset of left breast pain upon awakening this morning. No fever, chills, or sweats. Suspect irritation from adjacent implant, ddx includes subcutaneous infection  -Start antibiotic treatment (prescription sent to [place] on [address]). -If not 100% better after a week of antibiotics, patient to notify the office for early mammogram  Fatigue Likely related to stress from personal circumstances. Last blood work was in May. -No immediate plan for additional blood work.    No follow-ups on file.      Mila Merry, MD  [facility] [phone] (phone) [phone] (fax)  [facility]

## 2023-08-07 NOTE — Telephone Encounter (Signed)
  Chief Complaint: Sore spot the size of her thumb above the dark area of her nipple on her left breast.   Just noticed it this morning while getting dressed.  She doesn't feel any lumps in her breast.  She does have an old LOOP recorder in but it's on the other side of her breast than where it's sore.  Symptoms: Painful and tenderness in one area above her areola.   It hurts even when she is not touching it but it's very painful when she presses on it.   Frequency: Since this morning Pertinent Negatives: Patient denies nipple discharge or changes to her breast. Disposition: [] ED /[] Urgent Care (no appt availability in office) / [x] Appointment(In office/virtual)/ []  [place] [facility]/ [] Home Care/ [] Refused Recommended Disposition /[] [place] [facility]/ []  Follow-up with PCP Additional Notes: Appt made for today with Dr. Sherrie Mustache for 2:00.

## 2023-08-07 NOTE — Telephone Encounter (Signed)
Reason for Disposition  [1] Breast pain AND [2] cause is not known  Answer Assessment - Initial Assessment Questions 1. SYMPTOM: "What's the main symptom you're concerned about?"  (e.g., lump, pain, rash, nipple discharge)     Left breast has a sore spot on it.   It's a spot above dark spot of nipple size of a thumb.  When I press on it it really hurts.   It hurts even when I'm not touching it.   No redness seen. 2. LOCATION: "Where is the spot located?"     Above dark area of my nipple 3. ONSET: "When did pain  start?"     This morning  4. PRIOR HISTORY: "Do you have any history of prior problems with your breasts?" (e.g., lumps, cancer, fibrocystic breast disease)     I have an implant in.  This spot is on the other side of the implant.   My heart dr put the implant in to monitor my heart a few years ago but the battery is dead.  (LOOP Recorder) They don't take them out.   It's still in there but it's on the other side of my breast. 5. CAUSE: "What do you think is causing this symptom?"     I don't know 6. OTHER SYMPTOMS: "Do you have any other symptoms?" (e.g., fever, breast pain, redness or rash, nipple discharge)     No discharge or nipple soreness 7. PREGNANCY-BREASTFEEDING: "Is there any chance you are pregnant?" "When was your last menstrual period?" "Are you breastfeeding?"     N/A due to age  Protocols used: Breast Symptoms-A-AH

## 2023-08-12 ENCOUNTER — Telehealth: Payer: Self-pay

## 2023-08-12 DIAGNOSIS — N644 Mastodynia: Secondary | ICD-10-CM

## 2023-08-12 DIAGNOSIS — Z1231 Encounter for screening mammogram for malignant neoplasm of breast: Secondary | ICD-10-CM

## 2023-08-12 NOTE — Telephone Encounter (Signed)
 Copied from CRM [id number]. Topic: Clinical - Request for Lab/Test Order >> Aug 12, 2023  3:14 PM Tobias L wrote: Reason for CRM: Pt requesting a mammogram scheduled. Pt saw Dr. Gasper for problem last week and was told to follow up this week. No available appts this week with Dr, Addison. Pt requesting mammogram, pain in left breast is gone but states it still feels weird.   Please assist pt further.

## 2023-08-13 ENCOUNTER — Other Ambulatory Visit: Payer: Self-pay | Admitting: Family Medicine

## 2023-08-13 DIAGNOSIS — Z1231 Encounter for screening mammogram for malignant neoplasm of breast: Secondary | ICD-10-CM

## 2023-08-13 DIAGNOSIS — N644 Mastodynia: Secondary | ICD-10-CM

## 2023-09-01 ENCOUNTER — Ambulatory Visit
Admission: RE | Admit: 2023-09-01 | Discharge: 2023-09-01 | Disposition: A | Discharge status: Home / Self Care | Payer: PPO | Source: Ambulatory Visit | Attending: Family Medicine | Admitting: Family Medicine

## 2023-09-01 DIAGNOSIS — Z1231 Encounter for screening mammogram for malignant neoplasm of breast: Secondary | ICD-10-CM | POA: Insufficient documentation

## 2023-09-01 DIAGNOSIS — N644 Mastodynia: Secondary | ICD-10-CM | POA: Diagnosis not present

## 2023-09-01 DIAGNOSIS — R92313 Mammographic fatty tissue density, bilateral breasts: Secondary | ICD-10-CM | POA: Diagnosis not present

## 2023-09-02 DIAGNOSIS — Z8673 Personal history of transient ischemic attack (TIA), and cerebral infarction without residual deficits: Secondary | ICD-10-CM | POA: Diagnosis not present

## 2023-09-02 DIAGNOSIS — I480 Paroxysmal atrial fibrillation: Secondary | ICD-10-CM | POA: Diagnosis not present

## 2023-09-02 DIAGNOSIS — E7800 Pure hypercholesterolemia, unspecified: Secondary | ICD-10-CM | POA: Diagnosis not present

## 2023-09-02 DIAGNOSIS — I10 Essential (primary) hypertension: Secondary | ICD-10-CM | POA: Diagnosis not present

## 2023-09-02 DIAGNOSIS — I63532 Cerebral infarction due to unspecified occlusion or stenosis of left posterior cerebral artery: Secondary | ICD-10-CM | POA: Diagnosis not present

## 2023-09-02 DIAGNOSIS — Z23 Encounter for immunization: Secondary | ICD-10-CM | POA: Diagnosis not present

## 2023-09-02 DIAGNOSIS — Z95818 Presence of other cardiac implants and grafts: Secondary | ICD-10-CM | POA: Diagnosis not present

## 2023-09-22 ENCOUNTER — Other Ambulatory Visit: Payer: Self-pay | Admitting: Family Medicine

## 2023-09-22 DIAGNOSIS — I10 Essential (primary) hypertension: Secondary | ICD-10-CM

## 2023-09-22 DIAGNOSIS — J309 Allergic rhinitis, unspecified: Secondary | ICD-10-CM

## 2023-09-22 DIAGNOSIS — J45909 Unspecified asthma, uncomplicated: Secondary | ICD-10-CM

## 2023-09-29 ENCOUNTER — Other Ambulatory Visit: Payer: Self-pay | Admitting: Family Medicine

## 2023-09-29 DIAGNOSIS — I10 Essential (primary) hypertension: Secondary | ICD-10-CM

## 2023-10-01 NOTE — Telephone Encounter (Signed)
 Requested Prescriptions  Pending Prescriptions Disp Refills   amLODipine (NORVASC) 2.5 MG tablet [Pharmacy Med Name: amLODIPine Besylate 2.5 MG Oral Tablet] 90 tablet 0    Sig: Take 1 tablet by mouth once daily     Cardiovascular: Calcium Channel Blockers 2 Failed - 10/01/2023 12:11 PM      Failed - Last BP in normal range    BP Readings from Last 1 Encounters:  08/07/23 (!) 146/81         Failed - Valid encounter within last 6 months    Recent Outpatient Visits   None            Passed - Last Heart Rate in normal range    Pulse Readings from Last 1 Encounters:  08/07/23 93          Patient will need an office visit for further refills.

## 2023-10-14 DIAGNOSIS — H43813 Vitreous degeneration, bilateral: Secondary | ICD-10-CM | POA: Diagnosis not present

## 2023-10-14 DIAGNOSIS — E119 Type 2 diabetes mellitus without complications: Secondary | ICD-10-CM | POA: Diagnosis not present

## 2023-10-14 DIAGNOSIS — Z961 Presence of intraocular lens: Secondary | ICD-10-CM | POA: Diagnosis not present

## 2023-10-14 LAB — HM DIABETES EYE EXAM

## 2023-10-23 ENCOUNTER — Other Ambulatory Visit: Payer: Self-pay | Admitting: Family Medicine

## 2023-10-23 DIAGNOSIS — E119 Type 2 diabetes mellitus without complications: Secondary | ICD-10-CM

## 2023-10-23 DIAGNOSIS — I10 Essential (primary) hypertension: Secondary | ICD-10-CM

## 2023-10-23 NOTE — Telephone Encounter (Signed)
 Requested medications are due for refill today.  yes  Requested medications are on the active medications list.  yes  Last refill. 10/2022 #90 4 rf  Future visit scheduled.   yes  Notes to clinic.  Labs are expired. Pt needs an ov.    Requested Prescriptions  Pending Prescriptions Disp Refills   metFORMIN  (GLUCOPHAGE -XR) 500 MG 24 hr tablet [Pharmacy Med Name: metFORMIN  HCl ER 500 MG Oral Tablet Extended Release 24 Hour] 90 tablet 0    Sig: TAKE 1 TABLET BY MOUTH ONCE DAILY FOR BLOOD SUGAR     Endocrinology:  Diabetes - Biguanides Failed - 10/23/2023  2:56 PM      Failed - Cr in normal range and within 360 days    Creatinine  Date Value Ref Range Status  09/13/2013 1.18 0.60 - 1.30 mg/dL Final   Creatinine, Ser  Date Value Ref Range Status  11/10/2022 1.33 (H) 0.57 - 1.00 mg/dL Final   Creatinine, POC  Date Value Ref Range Status  09/11/2016 n/a mg/dL Final         Failed - HBA1C is between 0 and 7.9 and within 180 days    Hgb A1c MFr Bld  Date Value Ref Range Status  11/10/2022 6.5 (H) 4.8 - 5.6 % Final    Comment:             Prediabetes: 5.7 - 6.4          Diabetes: >6.4          Glycemic control for adults with diabetes: <7.0          Failed - eGFR in normal range and within 360 days    EGFR (African American)  Date Value Ref Range Status  09/13/2013 54 (L)  Final   GFR calc Af Amer  Date Value Ref Range Status  01/23/2020 71 >59 mL/min/1.73 Final    Comment:    **[facility] currently reports eGFR in compliance with the current**   recommendations of the [employer]. [facility] will   update reporting as new guidelines are published from the NKF-ASN   Task force.    EGFR (Non-African Amer.)  Date Value Ref Range Status  09/13/2013 47 (L)  Final    Comment:    eGFR values <9mL/min/1.73 m2 may be an indication of chronic kidney disease (CKD). Calculated eGFR is useful in patients with stable renal function. The eGFR calculation will not be  reliable in acutely ill patients when serum creatinine is changing rapidly. It is not useful in  patients on dialysis. The eGFR calculation may not be applicable to patients at the low and high extremes of body sizes, pregnant women, and vegetarians.    GFR calc non Af Amer  Date Value Ref Range Status  01/23/2020 62 >59 mL/min/1.73 Final   eGFR  Date Value Ref Range Status  11/10/2022 40 (L) >59 mL/min/1.73 Final         Failed - B12 Level in normal range and within 720 days    No results found for: VITAMINB12       Failed - Valid encounter within last 6 months    Recent Outpatient Visits   None            Failed - CBC within normal limits and completed in the last 12 months    WBC  Date Value Ref Range Status  03/07/2022 6.7 3.4 - 10.8 x10E3/uL Final  07/19/2018 4.2 4.0 - 10.5 K/uL Final   RBC  Date Value Ref Range Status  03/07/2022 3.97 3.77 - 5.28 x10E6/uL Final  07/19/2018 4.31 3.87 - 5.11 MIL/uL Final   Hemoglobin  Date Value Ref Range Status  03/07/2022 12.2 11.1 - 15.9 g/dL Final   Hematocrit  Date Value Ref Range Status  03/07/2022 37.3 34.0 - 46.6 % Final   MCHC  Date Value Ref Range Status  03/07/2022 32.7 31.5 - 35.7 g/dL Final  98/86/7979 66.9 30.0 - 36.0 g/dL Final   [facility]  Date Value Ref Range Status  03/07/2022 30.7 26.6 - 33.0 pg Final  07/19/2018 31.1 26.0 - 34.0 pg Final   MCV  Date Value Ref Range Status  03/07/2022 94 79 - 97 fL Final  09/13/2013 94 80 - 100 fL Final   No results found for: PLTCOUNTKUC, LABPLAT, POCPLA RDW  Date Value Ref Range Status  03/07/2022 11.3 (L) 11.7 - 15.4 % Final  09/13/2013 13.1 11.5 - 14.5 % Final          triamterene -hydrochlorothiazide  (MAXZIDE ) 75-50 MG tablet [Pharmacy Med Name: Triamterene -HCTZ 75-50 MG Oral Tablet] 90 tablet 0    Sig: Take 1 tablet by mouth once daily     Cardiovascular: Diuretic Combos Failed - 10/23/2023  2:56 PM      Failed - K in normal range and within 180  days    Potassium  Date Value Ref Range Status  11/10/2022 4.3 3.5 - 5.2 mmol/L Final  08/03/2014 3.7 3.5 - 5.1 mmol/L Final         Failed - Na in normal range and within 180 days    Sodium  Date Value Ref Range Status  11/10/2022 143 134 - 144 mmol/L Final  09/13/2013 137 136 - 145 mmol/L Final         Failed - Cr in normal range and within 180 days    Creatinine  Date Value Ref Range Status  09/13/2013 1.18 0.60 - 1.30 mg/dL Final   Creatinine, Ser  Date Value Ref Range Status  11/10/2022 1.33 (H) 0.57 - 1.00 mg/dL Final   Creatinine, POC  Date Value Ref Range Status  09/11/2016 n/a mg/dL Final         Failed - Last BP in normal range    BP Readings from Last 1 Encounters:  08/07/23 (!) 146/81         Failed - Valid encounter within last 6 months    Recent Outpatient Visits   None

## 2023-10-25 ENCOUNTER — Other Ambulatory Visit: Payer: Self-pay | Admitting: Family Medicine

## 2023-10-25 DIAGNOSIS — E119 Type 2 diabetes mellitus without complications: Secondary | ICD-10-CM

## 2023-11-02 ENCOUNTER — Encounter: Payer: Self-pay | Admitting: Family Medicine

## 2023-11-05 ENCOUNTER — Other Ambulatory Visit: Payer: Self-pay | Admitting: Family Medicine

## 2023-11-05 DIAGNOSIS — E782 Mixed hyperlipidemia: Secondary | ICD-10-CM

## 2023-11-20 ENCOUNTER — Ambulatory Visit: Payer: Self-pay

## 2023-11-20 NOTE — Telephone Encounter (Signed)
 Chief Complaint: stool incontinence Symptoms: stool incontinence Frequency: 3-4 months Pertinent Negatives: Patient denies abd pain Disposition: [] ED /[] Urgent Care (no appt availability in office) / [x] Appointment(In office/virtual)/ []  [place] [facility]/ [] Home Care/ [] Refused Recommended Disposition /[] [place] [facility]/ []  Follow-up with PCP Additional Notes: pt states that about 3-4 months ago she would not feel the urge to have.  Bm and then when it did it would be too late. States that it just comes before she can do anything about it. States that last night she was sleep and felt wet and and it happened twice.  States stools are also more loose than normal.   Copied from CRM [id number]. Topic: Appointments - Appointment Scheduling >> Nov 20, 2023  9:12 AM [name] wrote: Continence - adv is worsening Reason for Disposition  Nursing judgment or information in reference  Answer Assessment - Initial Assessment Questions 1. REASON FOR CALL: "What is your main concern right now?"     Stool incontinence 2. ONSET: "When did the  start?"     3-4 months ago 3. SEVERITY: "How bad is the incontinence?"     mod 4. FEVER: "Do you have a fever?"     no  Protocols used: No Guideline Available-A-AH

## 2023-11-26 ENCOUNTER — Ambulatory Visit (INDEPENDENT_AMBULATORY_CARE_PROVIDER_SITE_OTHER): Admitting: Physician Assistant

## 2023-11-26 ENCOUNTER — Encounter: Payer: Self-pay | Admitting: Physician Assistant

## 2023-11-26 VITALS — BP 133/63 | HR 77 | Resp 16 | Ht 62.0 in | Wt 168.0 lb

## 2023-11-26 DIAGNOSIS — K2100 Gastro-esophageal reflux disease with esophagitis, without bleeding: Secondary | ICD-10-CM

## 2023-11-26 DIAGNOSIS — K648 Other hemorrhoids: Secondary | ICD-10-CM

## 2023-11-26 DIAGNOSIS — E782 Mixed hyperlipidemia: Secondary | ICD-10-CM | POA: Diagnosis not present

## 2023-11-26 DIAGNOSIS — R159 Full incontinence of feces: Secondary | ICD-10-CM

## 2023-11-26 DIAGNOSIS — N1832 Chronic kidney disease, stage 3b: Secondary | ICD-10-CM

## 2023-11-26 DIAGNOSIS — E1159 Type 2 diabetes mellitus with other circulatory complications: Secondary | ICD-10-CM | POA: Diagnosis not present

## 2023-11-26 NOTE — Progress Notes (Signed)
 " Established patient visit  Patient: Destiny Gilbert   DOB: 1941-07-01   83 y.o. Female  MRN: [id number] Visit Date: 11/26/2023  Today's healthcare provider: Jolynn Spencer, PA-C   Chief Complaint  Patient presents with   Incontience    Incontinence with bowels ( 4 months)    Subjective      Discussed the use of AI scribe software for clinical note transcription with the patient, who gave verbal consent to proceed.  History of Present Illness Destiny Gilbert is an 83 year old female who presents with stool incontinence.  Stool incontinence began three to four months ago, with no urge to defecate until it is too late, leading to accidents. Incontinence occurs both day and night, including during sleep, and is described as moderate, with two recent episodes. She experiences regular bowel movements once or twice daily but has incontinence without sensation, often during activities like watching TV or shopping. She wears pads and changes them multiple times daily.  Her diet consists mainly of fast food, with infrequent vegetable intake. She has internal hemorrhoids and uses anusol  cream. A colonoscopy in 2021 showed internal hemorrhoids and diverticulosis. She has abnormal lipids, with recent labs showing an LDL of 80, hemoglobin A1c of 6.5, and a GFR of 40. She takes omeprazole  20 mg daily for gastroesophageal reflux with esophagitis and has recurrent esophageal stricture.  No abdominal pain, fever, recent falls, spine problems, or trauma. She does not identify specific dietary triggers but eats out frequently due to her husband's lack of appetite.       11/26/2023    8:52 AM 02/25/2023   11:00 AM 11/10/2022    9:01 AM  Depression screen PHQ 2/9  Decreased Interest 0 0 0  Down, Depressed, Hopeless 0 0 0  PHQ - 2 Score 0 0 0  Altered sleeping 0  0  Tired, decreased energy 1  1  Change in appetite 0  0  Feeling bad or failure about yourself  0  0  Trouble concentrating 0  0  Moving  slowly or fidgety/restless 0  0  Suicidal thoughts 0  0  PHQ-9 Score 1  1  Difficult doing work/chores Not difficult at all  Not difficult at all      11/26/2023    8:53 AM  GAD 7 : Generalized Anxiety Score  Nervous, Anxious, on Edge 0  Control/stop worrying 0  Worry too much - different things 0  Trouble relaxing 0  Restless 0  Easily annoyed or irritable 0  Afraid - awful might happen 0  Total GAD 7 Score 0  Anxiety Difficulty Not difficult at all    Medications: Outpatient Medications Prior to Visit  Medication Sig   acetaminophen  (TYLENOL ) 500 MG tablet Take 500 mg by mouth every 6 (six) hours as needed.   albuterol  (PROAIR  HFA) 108 (90 Base) MCG/ACT inhaler Inhale 2 puffs into the lungs every 6 (six) hours as needed.   amLODipine  (NORVASC ) 2.5 MG tablet Take 1 tablet by mouth once daily   atorvastatin  (LIPITOR) 40 MG tablet Take 1 tablet by mouth once daily   Blood Glucose Monitoring Suppl DEVI 1 each by Does not apply route in the morning, at noon, and at bedtime. May substitute to any manufacturer covered by patient's insurance.   cyclobenzaprine  (FLEXERIL ) 5 MG tablet Take 1 tablet (5 mg total) by mouth 3 (three) times daily as needed for muscle spasms.   ELIQUIS 5 MG TABS tablet Take 5 mg by mouth every  12 (twelve) hours.   fluticasone -salmeterol (ADVAIR) 250-50 MCG/ACT AEPB INHALE 1 DOSE BY MOUTH TWICE DAILY   GLUCOSAMINE-CHONDROITIN PO Take by mouth 2 (two) times daily.   guaiFENesin (MUCUS RELIEF PO) Take by mouth.   hydrocortisone  (PROCTO-MED HC ) 2.5 % rectal cream INSERT  CREAM RECTALLY TO AFFECTED AREA TWICE DAILY   levocetirizine (XYZAL ) 5 MG tablet TAKE 1 TABLET BY MOUTH ONCE DAILY IN THE EVENING   levothyroxine  (SYNTHROID ) 75 MCG tablet TAKE 1 TABLET BY MOUTH ONCE DAILY IN THE MORNING BEFORE BREAKFAST   loratadine  (EQ ALLERGY RELIEF) 10 MG tablet Take 1 tablet (10 mg total) by mouth daily.   metFORMIN  (GLUCOPHAGE -XR) 500 MG 24 hr tablet TAKE 1 TABLET BY MOUTH  ONCE DAILY FOR BLOOD SUGAR   nitroGLYCERIN  (NITROSTAT ) 0.3 MG SL tablet Place 1 tablet (0.3 mg total) under the tongue every 5 (five) minutes as needed for chest pain.   ONETOUCH ULTRA test strip USE 1 STRIP TO CHECK GLUCOSE ONCE DAILY   potassium chloride  (KLOR-CON ) 10 MEQ tablet Take 1 tablet by mouth twice daily   triamterene -hydrochlorothiazide  (MAXZIDE ) 75-50 MG tablet Take 1 tablet by mouth once daily   No facility-administered medications prior to visit.    Review of Systems All negative Except see HPI       Objective    BP 133/63 (BP Location: Left Arm, Patient Position: Sitting, Cuff Size: Normal)   Pulse 77   Resp 16   Ht 5' 2 (1.575 m)   Wt 168 lb (76.2 kg)   SpO2 97%   BMI 30.73 kg/m     Physical Exam Vitals reviewed.  Constitutional:      General: She is not in acute distress.    Appearance: Normal appearance. She is well-developed. She is not diaphoretic.  HENT:     Head: Normocephalic and atraumatic.  Eyes:     General: No scleral icterus.    Conjunctiva/sclera: Conjunctivae normal.  Neck:     Thyroid : No thyromegaly.  Cardiovascular:     Rate and Rhythm: Normal rate and regular rhythm.     Pulses: Normal pulses.     Heart sounds: Normal heart sounds. No murmur heard. Pulmonary:     Effort: Pulmonary effort is normal. No respiratory distress.     Breath sounds: Normal breath sounds. No wheezing, rhonchi or rales.  Abdominal:     General: Abdomen is flat. Bowel sounds are normal. There is distension.  Musculoskeletal:     Cervical back: Neck supple.     Right lower leg: No edema.     Left lower leg: No edema.  Lymphadenopathy:     Cervical: No cervical adenopathy.  Skin:    General: Skin is warm and dry.     Findings: No rash.  Neurological:     Mental Status: She is alert and oriented to person, place, and time. Mental status is at baseline.  Psychiatric:        Mood and Affect: Mood normal.        Behavior: Behavior normal.      No  results found for any visits on 11/26/23.       Assessment & Plan Fecal incontinence Involuntary stool passage possibly linked to dietary factors. - Initiate food diary to track intake and correlate with episodes. - Increase dietary fiber intake, including vegetables and fruits. - Consider fiber supplements like Metamucil with adequate hydration. - Refer to gastroenterologist if symptoms persist. - Consider medication if dietary changes are ineffective, with caution for  constipation. - Follow-up in one month to reassess.  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 Recurrent esophageal stricture noted. - Continue omeprazole  20 mg daily. - Implement dietary modifications, remain upright after meals.  Hemorrhoids Occasional symptoms managed with anusol  cream. - Continue anusol  cream as needed.  Type 2 diabetes mellitus Chronic Blood sugar around 120 mg/dL with occasional spikes due to dietary indiscretions. - Adhere to low-carb diet, monitor blood sugar. - Avoid high-sugar foods.  Chronic kidney disease, stage 3b GFR of 40. - Recommend DASH diet, low salt, balanced nutrition. Advised to drink 8-12 glasses of water every day, avoid NSAIDs, and have renal functions check avery 6-12 months to ensure stability.     Hyperlipidemia Last known LDL 80 mg/dL, no recent lipid panel. - Encourage low cholesterol diet Continue current regimen  Follow-up Monitor progress and adjust treatment as needed. - Schedule follow-up with Dr. Gasper in one month or current provider if unavailable.  Declined labs  No orders of the defined types were placed in this encounter.   Return in about 4 weeks (around 12/24/2023) for chronic disease f/u with pcp.   The patient was advised to call back or seek an in-person evaluation if the symptoms worsen or if the condition fails to improve as anticipated.  I discussed the assessment and  treatment plan with the patient. The patient was provided an opportunity to ask questions and all were answered. The patient agreed with the plan and demonstrated an understanding of the instructions.  I, Lareta Bruneau, PA-C have reviewed all documentation for this visit. The documentation on 11/26/2023  for the exam, diagnosis, procedures, and orders are all accurate and complete.  Jolynn Spencer, [facility], MMS [facility] [phone] (phone) [phone] (fax)  [facility] "

## 2023-12-01 DIAGNOSIS — N1832 Chronic kidney disease, stage 3b: Secondary | ICD-10-CM | POA: Insufficient documentation

## 2023-12-01 DIAGNOSIS — R159 Full incontinence of feces: Secondary | ICD-10-CM | POA: Insufficient documentation

## 2023-12-11 ENCOUNTER — Other Ambulatory Visit: Payer: Self-pay | Admitting: Family Medicine

## 2023-12-11 ENCOUNTER — Ambulatory Visit: Payer: Self-pay

## 2023-12-11 NOTE — Telephone Encounter (Signed)
 FYI Only or Action Required?: FYI only for provider  Patient was last seen in primary care on 08/07/2023 by Lamon Pillow, MD. Called Nurse Triage reporting Hemorrhoids. Symptoms began chronic. Interventions attempted: Rest, hydration, or home remedies. Symptoms are: stable.  Triage Disposition: See Physician Within 24 Hours  Patient/caregiver understands and will follow disposition?: No, wishes to speak with PCP  Copied from CRM [id number]. Topic: Clinical - Red Word Triage >> Dec 11, 2023 12:33 PM Carla L wrote: Red Word that prompted transfer to Nurse Triage: pt in a lot of pain due to hemorrhoids Reason for Disposition  MODERATE-SEVERE rectal pain (i.e., interferes with school, work, or sleep)  Answer Assessment - Initial Assessment Questions 1. SYMPTOM:  "What's the main symptom you're concerned about?" (e.g., pain, itching, swelling, rash)     hemorrhoids 2. ONSET: "When did the pain  start?"     Chronic-intermittently 3. RECTAL PAIN: "Do you have any pain around your rectum?" "How bad is the pain?"  (Scale 0-10; or mild, moderate, severe)   - NONE (0): no pain   - MILD (1-3): doesn't interfere with normal activities    - MODERATE (4-7): interferes with normal activities or awakens from sleep, limping    - SEVERE (8-10): excruciating pain, unable to have a bowel movement      moderate 4. RECTAL ITCHING: "Do you have any itching in this area?" "How bad is the itching?"  (Scale 0-10; or mild, moderate, severe)   - NONE: no itching   - MILD: doesn't interfere with normal activities    - MODERATE-SEVERE: interferes with normal activities or awakens from sleep     none 5. CONSTIPATION: "Do you have constipation?" If Yes, ask: "How bad is it?"     No 6. CAUSE: "What do you think is causing the anus symptoms?"     Hemorrhoids 7. OTHER SYMPTOMS: "Do you have any other symptoms?"  (e.g., abdomen pain, fever, rectal bleeding, vomiting)     denies  01/01/24 next appt with Dr. Shann Darnel.  Wants refill today. States hemorrhoid gets very painful when she does not have rx cream, called for refill today was made aware refills take up to 3 business day, patient transferred to nt for concern if no refill she will be in extreme pain throughout the weekend. Symptoms are not worse than normal. Please see refill encounter  Protocols used: Rectal Symptoms-A-AH

## 2023-12-28 ENCOUNTER — Ambulatory Visit: Admitting: Family Medicine

## 2023-12-28 ENCOUNTER — Other Ambulatory Visit: Payer: Self-pay | Admitting: Family Medicine

## 2023-12-28 DIAGNOSIS — I10 Essential (primary) hypertension: Secondary | ICD-10-CM

## 2024-01-01 ENCOUNTER — Ambulatory Visit (INDEPENDENT_AMBULATORY_CARE_PROVIDER_SITE_OTHER): Admitting: Family Medicine

## 2024-01-01 ENCOUNTER — Other Ambulatory Visit: Payer: Self-pay | Admitting: Family Medicine

## 2024-01-01 ENCOUNTER — Encounter: Payer: Self-pay | Admitting: Family Medicine

## 2024-01-01 VITALS — BP 127/63 | HR 79 | Resp 16 | Ht 62.0 in | Wt 166.7 lb

## 2024-01-01 DIAGNOSIS — E039 Hypothyroidism, unspecified: Secondary | ICD-10-CM | POA: Diagnosis not present

## 2024-01-01 DIAGNOSIS — J45909 Unspecified asthma, uncomplicated: Secondary | ICD-10-CM

## 2024-01-01 DIAGNOSIS — E1159 Type 2 diabetes mellitus with other circulatory complications: Secondary | ICD-10-CM | POA: Diagnosis not present

## 2024-01-01 DIAGNOSIS — I10 Essential (primary) hypertension: Secondary | ICD-10-CM

## 2024-01-01 DIAGNOSIS — N1832 Chronic kidney disease, stage 3b: Secondary | ICD-10-CM | POA: Diagnosis not present

## 2024-01-01 DIAGNOSIS — E782 Mixed hyperlipidemia: Secondary | ICD-10-CM | POA: Diagnosis not present

## 2024-01-01 DIAGNOSIS — R197 Diarrhea, unspecified: Secondary | ICD-10-CM

## 2024-01-01 NOTE — Progress Notes (Signed)
 Established patient visit   Patient: Destiny Gilbert   DOB: 14-Jul-1940   83 y.o. Female  MRN: [id number] Visit Date: 01/01/2024  Today's healthcare provider: [name], MD   Chief Complaint  Patient presents with   Diarrhea    Ongoing 6 months    Subjective    Discussed the use of AI scribe software for clinical note transcription with the patient, who gave verbal consent to proceed.  History of Present Illness   Destiny Gilbert is an 83 year old female who presents for follow up with difficulty controlling her bowels.  For the past six months, she has experienced progressive difficulty controlling her bowels, often having bowel movements without the sensation of needing to defecate, particularly when urinating. Her stools are described as 'real watery' or 'fluffy', and she has not had a normal bowel movement in about four to five months.  She has been increasing her fiber intake and is currently taking Stenofibers, but her symptoms have not improved. She experiences loose bowel movements most days, with occasional normal days. Yesterday, she had five loose bowel movements, and today she has had one so far.  She denies any stomach pain and has not had any changes in her medications. She does not use over-the-counter medications like Kaopectate or Imodium. She wears pads due to frequent bowel movements and has experienced bowel incontinence while sleeping.  Her current medications include Advair and a thyroid  medication, which she takes every morning before breakfast. She has about six or seven pills left of her thyroid  medication.       Medications: Outpatient Medications Prior to Visit  Medication Sig   acetaminophen  (TYLENOL ) 500 MG tablet Take 500 mg by mouth every 6 (six) hours as needed.   albuterol  (PROAIR  HFA) 108 (90 Base) MCG/ACT inhaler Inhale 2 puffs into the lungs every 6 (six) hours as needed.   amLODipine  (NORVASC ) 2.5 MG tablet TAKE 1 TABLET BY MOUTH ONCE  DAILY (NEEDS  OFFICE  VISIT  FOR  FURTHER  REFILLS)   atorvastatin  (LIPITOR) 40 MG tablet Take 1 tablet by mouth once daily   Blood Glucose Monitoring Suppl DEVI 1 each by Does not apply route in the morning, at noon, and at bedtime. May substitute to any manufacturer covered by patient's insurance.   cyclobenzaprine  (FLEXERIL ) 5 MG tablet Take 1 tablet (5 mg total) by mouth 3 (three) times daily as needed for muscle spasms.   ELIQUIS 5 MG TABS tablet Take 5 mg by mouth every 12 (twelve) hours.   fluticasone -salmeterol (ADVAIR) 250-50 MCG/ACT AEPB INHALE 1 DOSE BY MOUTH TWICE DAILY   GLUCOSAMINE-CHONDROITIN PO Take by mouth 2 (two) times daily.   guaiFENesin (MUCUS RELIEF PO) Take by mouth.   hydrocortisone  (PROCTO-MED HC ) 2.5 % rectal cream INSERT CREAM RECTALLY TO AFFECTED AREA TWICE DAILY   levocetirizine (XYZAL ) 5 MG tablet TAKE 1 TABLET BY MOUTH ONCE DAILY IN THE EVENING   levothyroxine  (SYNTHROID ) 75 MCG tablet TAKE 1 TABLET BY MOUTH ONCE DAILY IN THE MORNING BEFORE BREAKFAST   metFORMIN  (GLUCOPHAGE -XR) 500 MG 24 hr tablet TAKE 1 TABLET BY MOUTH ONCE DAILY FOR BLOOD SUGAR   nitroGLYCERIN  (NITROSTAT ) 0.3 MG SL tablet Place 1 tablet (0.3 mg total) under the tongue every 5 (five) minutes as needed for chest pain.   ONETOUCH ULTRA test strip USE 1 STRIP TO CHECK GLUCOSE ONCE DAILY   potassium chloride  (KLOR-CON ) 10 MEQ tablet Take 1 tablet by mouth twice daily  triamterene -hydrochlorothiazide  (MAXZIDE ) 75-50 MG tablet Take 1 tablet by mouth once daily   loratadine  (EQ ALLERGY RELIEF) 10 MG tablet Take 1 tablet (10 mg total) by mouth daily.   No facility-administered medications prior to visit.   Review of Systems  Constitutional:  Negative for appetite change, chills, fatigue and fever.  Respiratory:  Negative for chest tightness and shortness of breath.   Cardiovascular:  Negative for chest pain and palpitations.  Gastrointestinal:  Positive for diarrhea. Negative for abdominal pain,  blood in stool, constipation, nausea and vomiting.  Neurological:  Negative for dizziness and weakness.       Objective    BP 127/63 (BP Location: Right Arm, Patient Position: Sitting, Cuff Size: Normal)   Pulse 79   Resp 16   Ht 5' 2 (1.575 m)   Wt 166 lb 11.2 oz (75.6 kg)   SpO2 96%   BMI 30.49 kg/m   Physical Exam   General appearance: Mildly obese female, cooperative and in no acute distress Head: Normocephalic, without obvious abnormality, atraumatic Respiratory: Respirations even and unlabored, normal respiratory rate Extremities: All extremities are intact.  Skin: Skin color, texture, turgor normal. No rashes seen  Psych: Appropriate mood and affect. Neurologic: Mental status: Alert, oriented to person, place, and time, thought content appropriate.    Assessment & Plan     1. Diarrhea, unspecified type (Primary) Possibly functional. Rule out infections, metabolic etiologies. Avoid dairy in diet.  - Lipase - C difficile Toxins A+B W/Rflx - Calprotectin, Fecal - Clostridium Difficile by PCR - Stool culture - Ova and parasite examination - Pancreatic elastase, fecal  2. Hypothyroidism, unspecified type  - T4 AND TSH  3. Type 2 diabetes mellitus with vascular disease (HCC)  - Hemoglobin A1c  4. Stage 3b chronic kidney disease (HCC)  - Comprehensive metabolic panel with GFR  5. Mixed hyperlipidemia She is tolerating atorvastatin  well with no adverse effects.   - Lipid panel  6. Primary hypertension Well controlled.  Continue current medications.     7. Uncomplicated asthma, unspecified asthma severity, unspecified whether persistent Refilled Advair.       [name], MD  [facility] [phone] (phone) [phone] (fax)  [facility]

## 2024-01-02 LAB — HEMOGLOBIN A1C
Est. average glucose Bld gHb Est-mCnc: 131 mg/dL
Hgb A1c MFr Bld: 6.2 % — ABNORMAL HIGH (ref 4.8–5.6)

## 2024-01-02 LAB — COMPREHENSIVE METABOLIC PANEL WITH GFR
ALT: 15 IU/L (ref 0–32)
AST: 28 IU/L (ref 0–40)
Albumin: 4.1 g/dL (ref 3.7–4.7)
Alkaline Phosphatase: 92 IU/L (ref 44–121)
BUN/Creatinine Ratio: 14 (ref 12–28)
BUN: 20 mg/dL (ref 8–27)
Bilirubin Total: 0.5 mg/dL (ref 0.0–1.2)
CO2: 21 mmol/L (ref 20–29)
Calcium: 9.7 mg/dL (ref 8.7–10.3)
Chloride: 104 mmol/L (ref 96–106)
Creatinine, Ser: 1.38 mg/dL — ABNORMAL HIGH (ref 0.57–1.00)
Globulin, Total: 2.8 g/dL (ref 1.5–4.5)
Glucose: 109 mg/dL — ABNORMAL HIGH (ref 70–99)
Potassium: 4.7 mmol/L (ref 3.5–5.2)
Sodium: 142 mmol/L (ref 134–144)
Total Protein: 6.9 g/dL (ref 6.0–8.5)
eGFR: 38 mL/min/{1.73_m2} — ABNORMAL LOW (ref >59)

## 2024-01-02 LAB — LIPID PANEL
Chol/HDL Ratio: 2.9 ratio (ref 0.0–4.4)
Cholesterol, Total: 162 mg/dL (ref 100–199)
HDL: 55 mg/dL (ref >39)
LDL Chol Calc (NIH): 84 mg/dL (ref 0–99)
Triglycerides: 128 mg/dL (ref 0–149)
VLDL Cholesterol Cal: 23 mg/dL (ref 5–40)

## 2024-01-02 LAB — T4 AND TSH
T4, Total: 6.9 ug/dL (ref 4.5–12.0)
TSH: 1.86 u[IU]/mL (ref 0.450–4.500)

## 2024-01-02 LAB — LIPASE: Lipase: 30 U/L (ref 14–85)

## 2024-01-03 ENCOUNTER — Ambulatory Visit: Payer: Self-pay | Admitting: Family Medicine

## 2024-01-03 DIAGNOSIS — R197 Diarrhea, unspecified: Secondary | ICD-10-CM

## 2024-01-04 DIAGNOSIS — R197 Diarrhea, unspecified: Secondary | ICD-10-CM | POA: Diagnosis not present

## 2024-01-06 ENCOUNTER — Telehealth: Payer: Self-pay

## 2024-01-06 DIAGNOSIS — E039 Hypothyroidism, unspecified: Secondary | ICD-10-CM

## 2024-01-06 MED ORDER — LEVOTHYROXINE SODIUM 75 MCG PO TABS: 75.0000 ug | ORAL_TABLET | Freq: Every day | ORAL | 3 refills | Status: AC

## 2024-01-06 NOTE — Telephone Encounter (Signed)
 Patient requesting review of labs, specific to thyroid  as her medication is due for refill and she wants to know if dose needs any adjusting   Copied from CRM [id number]. Topic: Clinical - Medication Question >> Jan 06, 2024 10:14 AM Winona R wrote: Pt would like to know if Dr. Gasper plans on changing her medication following her labs. Pt is correctly due for a refill of her levothyroxine  (SYNTHROID ) 75 MCG tablet [[id number]] how ever DR. Fisher advise her to hold off until labs are reviewed.

## 2024-01-06 NOTE — Addendum Note (Signed)
 Addended by: GASPER NANCYANN BRAVO on: 01/06/2024 04:26 PM   Modules accepted: Orders

## 2024-01-07 ENCOUNTER — Telehealth: Payer: Self-pay

## 2024-01-07 MED ORDER — METRONIDAZOLE 500 MG PO TABS: 500.0000 mg | ORAL_TABLET | Freq: Two times a day (BID) | ORAL | 0 refills | Status: AC

## 2024-01-07 NOTE — Telephone Encounter (Signed)
 Copied from CRM [id number]. Topic: Clinical - Medication Question >> Jan 06, 2024 10:14 AM Destiny Gilbert wrote: Pt would like to know if Dr. Gasper plans on changing her medication following her labs. Pt is correctly due for a refill of her levothyroxine  (SYNTHROID ) 75 MCG tablet [[id number]] how ever DR. Fisher advise her to hold off until labs are reviewed. >> Jan 07, 2024  9:05 AM [name] wrote: Patient calling back pls see original message?  Also wants to know if stool samples back in?

## 2024-01-07 NOTE — Telephone Encounter (Signed)
 Her levothyroxine  was refilled yesterday, same dose

## 2024-01-07 NOTE — Telephone Encounter (Signed)
 She will need to take both.

## 2024-01-07 NOTE — Telephone Encounter (Signed)
 Noted

## 2024-01-08 LAB — CLOSTRIDIUM DIFFICILE BY PCR: Toxigenic C. Difficile by PCR: NEGATIVE

## 2024-01-08 LAB — PANCREATIC ELASTASE, FECAL: Pancreatic Elastase, Fecal: >800 ug Elast./g (ref >200)

## 2024-01-08 LAB — STOOL CULTURE: E coli, Shiga toxin Assay: NEGATIVE

## 2024-01-08 LAB — C DIFFICILE, CYTOTOXIN B

## 2024-01-08 LAB — CALPROTECTIN, FECAL: Calprotectin, Fecal: 793 ug/g — ABNORMAL HIGH (ref 0–120)

## 2024-01-08 LAB — OVA AND PARASITE EXAMINATION

## 2024-01-08 LAB — C DIFFICILE TOXINS A+B W/RFLX: C difficile Toxins A+B, EIA: NEGATIVE

## 2024-01-24 ENCOUNTER — Other Ambulatory Visit: Payer: Self-pay | Admitting: Family Medicine

## 2024-01-24 DIAGNOSIS — E119 Type 2 diabetes mellitus without complications: Secondary | ICD-10-CM

## 2024-01-25 ENCOUNTER — Other Ambulatory Visit: Payer: Self-pay | Admitting: Family Medicine

## 2024-01-25 DIAGNOSIS — I10 Essential (primary) hypertension: Secondary | ICD-10-CM

## 2024-02-08 ENCOUNTER — Ambulatory Visit: Payer: Self-pay

## 2024-02-08 ENCOUNTER — Telehealth: Payer: Self-pay | Admitting: Nurse Practitioner

## 2024-02-08 DIAGNOSIS — J029 Acute pharyngitis, unspecified: Secondary | ICD-10-CM | POA: Diagnosis not present

## 2024-02-08 DIAGNOSIS — R059 Cough, unspecified: Secondary | ICD-10-CM | POA: Diagnosis not present

## 2024-02-08 DIAGNOSIS — U071 COVID-19: Secondary | ICD-10-CM | POA: Diagnosis not present

## 2024-02-08 NOTE — Telephone Encounter (Signed)
 Copied from CRM [id number]. Topic: General - Other >> Feb 08, 2024  9:48 AM Aleatha C wrote: Reason for CRM: Patient tested positive for covid, sneezing, and aching and wants to know what she should do next, please give call for further instructions

## 2024-02-08 NOTE — Telephone Encounter (Signed)
 FYI Only or Action Required?: FYI only for provider.  Patient was last seen in primary care on 01/01/2024 by Destiny Nancyann BRAVO, MD.  Called Nurse Triage reporting Covid Positive.  Symptoms began Saturday.  Interventions attempted: Rest, hydration, or home remedies.  Symptoms are: unchanged.  Triage Disposition: Call PCP Within 24 Hours-patient to be seen at Urgent Care  Patient/caregiver understands and will follow disposition?: Yes  Copied from CRM #[id number]. Topic: Clinical - Red Word Triage >> Feb 08, 2024  3:17 PM Myrick T wrote: Red Word that prompted transfer to Nurse Triage: Patient tested positive for covid, sneezing, and aching and wants to know what she should do next, please give call for further instructions. Reason for Disposition  [1] HIGH RISK patient (e.g., weak immune system, age > 64 years, obesity with BMI 30 or higher, pregnant, chronic lung disease or other chronic medical condition) AND [2] COVID symptoms (e.g., cough, fever)  (Exceptions: Already seen by PCP and no new or worsening symptoms.)  Answer Assessment - Initial Assessment Questions 1. COVID-19 DIAGNOSIS: How do you know that you have COVID? (e.g., positive lab test or self-test, diagnosed by doctor or NP/PA, symptoms after exposure).     Home test positive today 2. COVID-19 EXPOSURE: Was there any known exposure to COVID before the symptoms began? CDC Definition of close contact: within 6 feet (2 meters) for a total of 15 minutes or more over a 24-hour period.      Patient states she went shopping on Friday and developed symptoms on Saturday 3. ONSET: When did the COVID-19 symptoms start?      Saturday 4. WORST SYMPTOM: What is your worst symptom? (e.g., cough, fever, shortness of breath, muscle aches)     Congestion, coughing and head 5. COUGH: Do you have a cough? If Yes, ask: How bad is the cough?       Cough is moderate in nature currently 6. FEVER: Do you have a fever? If Yes, ask:  What is your temperature, how was it measured, and when did it start?     No fever currently 7. RESPIRATORY STATUS: Describe your breathing? (e.g., normal; shortness of breath, wheezing, unable to speak)      Has to breath through her mouth due to being stuffed up.  8. BETTER-SAME-WORSE: Are you getting better, staying the same or getting worse compared to yesterday?  If getting worse, ask, In what way?     Not as bad 9. OTHER SYMPTOMS: Do you have any other symptoms?  (e.g., chills, fatigue, headache, loss of smell or taste, muscle pain, sore throat)     Fatigue, chills, sore throat, muscle pain 10. HIGH RISK DISEASE: Do you have any chronic medical problems? 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 VACCINE: Have you had the COVID-19 vaccine? If Yes, ask: Which one, how many shots, when did you get it?       2 shots 13. O2 SATURATION MONITOR:  Do you use an oxygen saturation monitor (pulse oximeter) at home? If Yes, ask What is your reading (oxygen level) today? What is your usual oxygen saturation reading? (e.g., 95%)       Patient doesn't have one.  Protocols used: Coronavirus (COVID-19) Diagnosed or Suspected-A-AH

## 2024-02-09 NOTE — Telephone Encounter (Signed)
 Noted

## 2024-03-01 ENCOUNTER — Ambulatory Visit: Payer: PPO

## 2024-03-03 DIAGNOSIS — R194 Change in bowel habit: Secondary | ICD-10-CM | POA: Diagnosis not present

## 2024-03-03 DIAGNOSIS — Z7901 Long term (current) use of anticoagulants: Secondary | ICD-10-CM | POA: Diagnosis not present

## 2024-03-03 DIAGNOSIS — R195 Other fecal abnormalities: Secondary | ICD-10-CM | POA: Diagnosis not present

## 2024-03-04 DIAGNOSIS — R194 Change in bowel habit: Secondary | ICD-10-CM | POA: Diagnosis not present

## 2024-03-04 DIAGNOSIS — R195 Other fecal abnormalities: Secondary | ICD-10-CM | POA: Diagnosis not present

## 2024-03-14 ENCOUNTER — Encounter: Payer: Self-pay | Admitting: Internal Medicine

## 2024-03-23 ENCOUNTER — Other Ambulatory Visit: Payer: Self-pay

## 2024-03-23 ENCOUNTER — Ambulatory Visit: Admitting: Anesthesiology

## 2024-03-23 ENCOUNTER — Ambulatory Visit
Admission: RE | Admit: 2024-03-23 | Discharge: 2024-03-23 | Disposition: A | Discharge status: Home / Self Care | Attending: Internal Medicine | Admitting: Internal Medicine

## 2024-03-23 ENCOUNTER — Encounter
Admission: RE | Disposition: A | Discharge status: Home / Self Care | Payer: Self-pay | Source: Home / Self Care | Attending: Internal Medicine

## 2024-03-23 ENCOUNTER — Encounter: Payer: Self-pay | Admitting: Internal Medicine

## 2024-03-23 DIAGNOSIS — K5790 Diverticulosis of intestine, part unspecified, without perforation or abscess without bleeding: Secondary | ICD-10-CM | POA: Diagnosis not present

## 2024-03-23 DIAGNOSIS — N1832 Chronic kidney disease, stage 3b: Secondary | ICD-10-CM | POA: Insufficient documentation

## 2024-03-23 DIAGNOSIS — Z79899 Other long term (current) drug therapy: Secondary | ICD-10-CM | POA: Insufficient documentation

## 2024-03-23 DIAGNOSIS — E119 Type 2 diabetes mellitus without complications: Secondary | ICD-10-CM | POA: Diagnosis not present

## 2024-03-23 DIAGNOSIS — K449 Diaphragmatic hernia without obstruction or gangrene: Secondary | ICD-10-CM | POA: Diagnosis not present

## 2024-03-23 DIAGNOSIS — I8500 Esophageal varices without bleeding: Secondary | ICD-10-CM | POA: Diagnosis not present

## 2024-03-23 DIAGNOSIS — E039 Hypothyroidism, unspecified: Secondary | ICD-10-CM | POA: Diagnosis not present

## 2024-03-23 DIAGNOSIS — I4891 Unspecified atrial fibrillation: Secondary | ICD-10-CM | POA: Diagnosis not present

## 2024-03-23 DIAGNOSIS — K642 Third degree hemorrhoids: Secondary | ICD-10-CM | POA: Diagnosis not present

## 2024-03-23 DIAGNOSIS — Z860101 Personal history of adenomatous and serrated colon polyps: Secondary | ICD-10-CM | POA: Diagnosis not present

## 2024-03-23 DIAGNOSIS — Z8673 Personal history of transient ischemic attack (TIA), and cerebral infarction without residual deficits: Secondary | ICD-10-CM | POA: Diagnosis not present

## 2024-03-23 DIAGNOSIS — Z7984 Long term (current) use of oral hypoglycemic drugs: Secondary | ICD-10-CM | POA: Insufficient documentation

## 2024-03-23 DIAGNOSIS — K5730 Diverticulosis of large intestine without perforation or abscess without bleeding: Secondary | ICD-10-CM | POA: Diagnosis not present

## 2024-03-23 DIAGNOSIS — K219 Gastro-esophageal reflux disease without esophagitis: Secondary | ICD-10-CM | POA: Diagnosis not present

## 2024-03-23 DIAGNOSIS — K222 Esophageal obstruction: Secondary | ICD-10-CM | POA: Diagnosis not present

## 2024-03-23 DIAGNOSIS — K648 Other hemorrhoids: Secondary | ICD-10-CM | POA: Diagnosis not present

## 2024-03-23 DIAGNOSIS — R194 Change in bowel habit: Secondary | ICD-10-CM | POA: Diagnosis not present

## 2024-03-23 DIAGNOSIS — Z79890 Hormone replacement therapy (postmenopausal): Secondary | ICD-10-CM | POA: Insufficient documentation

## 2024-03-23 DIAGNOSIS — I129 Hypertensive chronic kidney disease with stage 1 through stage 4 chronic kidney disease, or unspecified chronic kidney disease: Secondary | ICD-10-CM | POA: Insufficient documentation

## 2024-03-23 DIAGNOSIS — Z1211 Encounter for screening for malignant neoplasm of colon: Secondary | ICD-10-CM | POA: Insufficient documentation

## 2024-03-23 DIAGNOSIS — R195 Other fecal abnormalities: Secondary | ICD-10-CM | POA: Diagnosis not present

## 2024-03-23 DIAGNOSIS — R1310 Dysphagia, unspecified: Secondary | ICD-10-CM | POA: Diagnosis not present

## 2024-03-23 DIAGNOSIS — K625 Hemorrhage of anus and rectum: Secondary | ICD-10-CM | POA: Diagnosis not present

## 2024-03-23 DIAGNOSIS — K2289 Other specified disease of esophagus: Secondary | ICD-10-CM | POA: Diagnosis not present

## 2024-03-23 HISTORY — DX: Paroxysmal atrial fibrillation: I48.0

## 2024-03-23 HISTORY — PX: COLONOSCOPY: SHX5424

## 2024-03-23 HISTORY — PX: ESOPHAGOGASTRODUODENOSCOPY: SHX5428

## 2024-03-23 HISTORY — PX: MALONEY DILATION: SHX5535

## 2024-03-23 LAB — GLUCOSE, CAPILLARY: Glucose-Capillary: 123 mg/dL — ABNORMAL HIGH (ref 70–99)

## 2024-03-23 SURGERY — COLONOSCOPY
Anesthesia: General

## 2024-03-23 MED ORDER — LIDOCAINE HCL (PF) 2 % IJ SOLN
INTRAMUSCULAR | Status: DC | PRN
  Administered 2024-03-23: 100 mg via INTRADERMAL

## 2024-03-23 MED ORDER — PROPOFOL 500 MG/50ML IV EMUL
INTRAVENOUS | Status: DC | PRN
  Administered 2024-03-23: 125 ug/kg/min via INTRAVENOUS

## 2024-03-23 MED ORDER — SODIUM CHLORIDE 0.9 % IV SOLN: INTRAVENOUS | Status: DC

## 2024-03-23 MED ORDER — DEXMEDETOMIDINE HCL IN NACL 80 MCG/20ML IV SOLN
INTRAVENOUS | Status: DC | PRN
  Administered 2024-03-23: 4 ug via INTRAVENOUS

## 2024-03-23 MED ORDER — PROPOFOL 10 MG/ML IV BOLUS
INTRAVENOUS | Status: DC | PRN
  Administered 2024-03-23: 60 mg via INTRAVENOUS
  Administered 2024-03-23: 20 mg via INTRAVENOUS

## 2024-03-23 NOTE — Op Note (Addendum)
 [facility] Gastroenterology Patient Name: Destiny Gilbert Procedure Date: 03/23/2024 9:33 AM MRN: [id number] Account #: [id number] Date of Birth: 20-Apr-1941 Admit Type: Outpatient Age: 83 Room: [facility] ENDO ROOM 2 Gender: Female Note Status: Supervisor Override Instrument Name: Colon Scope [id number] Procedure:             Colonoscopy Indications:           High risk colon cancer surveillance: Personal history                         of non-advanced adenoma, Elevated fecal calprotectin,                         Change in bowe habits, Loose stools. Providers:             Jerusha Reising K. Aundria MD, MD Referring MD:          Nancyann BRAVO. Gasper, MD (Referring MD) Medicines:             Propofol  per Anesthesia Complications:         No immediate complications. Estimated blood loss: None. Procedure:             Pre-Anesthesia Assessment:                        - The risks and benefits of the procedure and the                         sedation options and risks were discussed with the                         patient. All questions were answered and informed                         consent was obtained.                        - Patient identification and proposed procedure were                         verified prior to the procedure by the nurse. The                         procedure was verified in the procedure room.                        - ASA Grade Assessment: III - A patient with severe                         systemic disease.                        - After reviewing the risks and benefits, the patient                         was deemed in satisfactory condition to undergo the                         procedure.  After obtaining informed consent, the colonoscope was                         passed under direct vision. Throughout the procedure,                         the patient's blood pressure, pulse, and oxygen                         saturations were  monitored continuously. The                         Colonoscope was introduced through the anus and                         advanced to the the cecum, identified by appendiceal                         orifice and ileocecal valve. The colonoscopy was                         technically difficult and complex due to significant                         looping. Successful completion of the procedure was                         aided by applying abdominal pressure. The patient                         tolerated the procedure well. The quality of the bowel                         preparation was good. The ileocecal valve, appendiceal                         orifice, and rectum were photographed. Findings:      The perianal exam findings include internal hemorrhoids that do not       return to the anal canal, thus continuously prolapsed (Grade IV).      Non-bleeding internal hemorrhoids were found during retroflexion. The       hemorrhoids were Grade III (internal hemorrhoids that prolapse but       require manual reduction).      Many small-mouthed diverticula were found in the sigmoid colon,       descending colon and transverse colon. There was no evidence of       diverticular bleeding.      The exam was otherwise without abnormality. Impression:            - Internal hemorrhoids that do not return to the anal                         canal, thus continuously prolapsed (Grade IV) found on                         perianal exam.                        -  Non-bleeding internal hemorrhoids.                        - Mild diverticulosis in the sigmoid colon, in the                         descending colon and in the transverse colon. There                         was no evidence of diverticular bleeding.                        - The examination was otherwise normal.                        - No specimens collected. Recommendation:        - Monitor results to esophageal dilation                         - Patient has a contact number available for                         emergencies. The signs and symptoms of potential                         delayed complications were discussed with the patient.                         Return to normal activities tomorrow. Written                         discharge instructions were provided to the patient.                        - Resume previous diet.                        - Continue present medications.                        - No repeat colonoscopy due to current age (55 years                         or older) and the absence of colonic polyps.                        - You do NOT require further colon cancer screening                         measures (Annual stool testing (i.e. hemoccult, FIT,                         cologuard), sigmoidoscopy, colonoscopy or CT                         colonography). You should share this recommendation                         with your Primary Care provider.                        -  Monitor results to esophageal dilation                        - Follow up with Romero Antigua, PA-C at [facility]. [phone].                        - Telephone GI office to schedule appointment in 3                         months.                        - The findings and recommendations were discussed with                         the patient. Procedure Code(s):     --- Professional ---                        H9894, Colorectal cancer screening; colonoscopy on                         individual at high risk Diagnosis Code(s):     --- Professional ---                        K57.30, Diverticulosis of large intestine without                         perforation or abscess without bleeding                        K64.2, Third degree hemorrhoids                        Z86.010, Personal history of colonic polyps CPT copyright 2022 American Medical Association. All rights reserved. The codes  documented in this report are preliminary and upon coder review may  be revised to meet current compliance requirements. Ladell MARLA Boss MD, MD 03/23/2024 10:10:34 AM This report has been signed electronically. Number of Addenda: 0 Note Initiated On: 03/23/2024 9:33 AM Scope Withdrawal Time: 0 hours 6 minutes 9 seconds  Total Procedure Duration: 0 hours 12 minutes 4 seconds  Estimated Blood Loss:  Estimated blood loss: none. Estimated blood loss: none.      [facility]

## 2024-03-23 NOTE — H&P (Signed)
 Outpatient short stay form Pre-procedure 03/23/2024 9:03 AM Destiny Gilbert, [name]  Primary Physician: Destiny Gilbert, [name]  Reason for visit:  Inflammatory diarrhea, change in bowel habits  History of present illness:  Destiny Gilbert reports in May 2025 developing issues with diarrhea, initially she was having liquid stools. Saw PCP and was recommended to have a high-fiber diet and take fiber supplement. This did not improve her symptoms. She endorses continuing to have diarrhea and she ultimately saw PCP again and had stool studies. Infectious stool studies were negative but she had an elevated fecal calprotectin. She was given Flagyl  empirically. She endorses that the Flagyl  improved her symptoms but have not completely resolved. She is continuing to have loose stools 2-3 times a day. She feels that her baseline was more formed stools daily. Initially had some issues with nocturnal stooling but this improved after the course of Flagyl  and has not returned. She had 1 episode of rectal bleeding in August. She denies any rectal pain. Occasional abdominal bloating but denies abdominal pain. Currently having 2-3 stools per day that are #6 on the Bristol stool scale.  She feels her GERD is well-controlled on current dose of PPI. She is no longer having dysphagia since her esophageal dilation fall 2024. Takes omeprazole  20 mg daily. Denies any nausea vomiting. Appetite good weight stable.  She denies tobacco. Alcohol 1 drink a couple times a week. Denies any NSAIDs.     Current Facility-Administered Medications:    0.9 %  sodium chloride  infusion, , Intravenous, Continuous, Destiny Musich K, MD  Medications Prior to Admission  Medication Sig Dispense Refill Last Dose/Taking   amLODipine  (NORVASC ) 2.5 MG tablet TAKE 1 TABLET BY MOUTH ONCE DAILY (NEEDS  OFFICE  VISIT  FOR  FURTHER  REFILLS) 90 tablet 0 03/22/2024   atorvastatin  (LIPITOR) 40 MG tablet Take 1 tablet by mouth once daily 90 tablet 4 03/22/2024    fluticasone -salmeterol (ADVAIR) 250-50 MCG/ACT AEPB INHALE 1 DOSE BY MOUTH TWICE DAILY 180 each 0 03/22/2024   GLUCOSAMINE-CHONDROITIN PO Take by mouth 2 (two) times daily.   03/22/2024   guaiFENesin (MUCUS RELIEF PO) Take by mouth.   03/22/2024   hydrocortisone  (PROCTO-MED HC ) 2.5 % rectal cream INSERT CREAM RECTALLY TO AFFECTED AREA TWICE DAILY 28 g 2 03/23/2024 Morning   levothyroxine  (SYNTHROID ) 75 MCG tablet Take 1 tablet (75 mcg total) by mouth daily. 90 tablet 3 03/22/2024   loratadine  (EQ ALLERGY RELIEF) 10 MG tablet Take 1 tablet (10 mg total) by mouth daily. 30 tablet 1 Past Month   metFORMIN  (GLUCOPHAGE -XR) 500 MG 24 hr tablet TAKE 1 TABLET BY MOUTH ONCE DAILY FOR BLOOD SUGAR 90 tablet 4 03/22/2024   potassium chloride  (KLOR-CON ) 10 MEQ tablet Take 1 tablet by mouth twice daily 180 tablet 1 03/22/2024   triamterene -hydrochlorothiazide  (MAXZIDE ) 75-50 MG tablet Take 1 tablet by mouth once daily 90 tablet 1 03/22/2024   acetaminophen  (TYLENOL ) 500 MG tablet Take 500 mg by mouth every 6 (six) hours as needed.      albuterol  (PROAIR  HFA) 108 (90 Base) MCG/ACT inhaler Inhale 2 puffs into the lungs every 6 (six) hours as needed. 1 Inhaler 2    Blood Glucose Monitoring Suppl DEVI 1 each by Does not apply route in the morning, at noon, and at bedtime. May substitute to any manufacturer covered by patient's insurance. 1 each 0    cyclobenzaprine  (FLEXERIL ) 5 MG tablet Take 1 tablet (5 mg total) by mouth 3 (three) times daily as needed for  muscle spasms. 30 tablet 1    ELIQUIS 5 MG TABS tablet Take 5 mg by mouth every 12 (twelve) hours.  11 03/20/2024   levocetirizine (XYZAL ) 5 MG tablet TAKE 1 TABLET BY MOUTH ONCE DAILY IN THE EVENING 90 tablet 1    nitroGLYCERIN  (NITROSTAT ) 0.3 MG SL tablet Place 1 tablet (0.3 mg total) under the tongue every 5 (five) minutes as needed for chest pain. 30 tablet 0    ONETOUCH ULTRA test strip USE 1 STRIP TO CHECK GLUCOSE ONCE DAILY 100 each 12      Allergies   Allergen Reactions   Dipyridamole  Nausea Only    Headache   Sulfa Antibiotics Nausea And Vomiting   Sulfasalazine Nausea And Vomiting     Past Medical History:  Diagnosis Date   Bladder infection    Diabetes mellitus without complication (HCC)    Diabetic acidosis (HCC)    Diverticulosis    History of peptic ulcer    perforated   Hyperlipidemia    Hyperplastic colon polyp    Hypertension    Paroxysmal atrial fibrillation (HCC)    Pneumonia 01/23/2015   [facility]    Stroke [facility])     Review of systems:  Otherwise negative.    Physical Exam  Gen: Alert, oriented. Appears stated age.  HEENT: [place]/AT. PERRLA. Lungs: CTA, no wheezes. CV: RR nl S1, S2. Abd: soft, benign, no masses. BS+ Ext: No edema. Pulses 2+    Planned procedures: Proceed with colonoscopy. The patient understands the nature of the planned procedure, indications, risks, alternatives and potential complications including but not limited to bleeding, infection, perforation, damage to internal organs and possible oversedation/side effects from anesthesia. The patient agrees and gives consent to proceed.  Please refer to procedure notes for findings, recommendations and patient disposition/instructions.     Destiny Gilbert, [name] Gastroenterology 03/23/2024  9:03 AM

## 2024-03-23 NOTE — Interval H&P Note (Signed)
 History and Physical Interval Note:  03/23/2024 9:04 AM  Destiny Gilbert  has presented today for surgery, with the diagnosis of Elevated fecal calprotectin (R19.5) Change in bowel habits (R19.4) Loose stools (R19.5) Dysphagia, unspecified type (R13.10).  The various methods of treatment have been discussed with the patient and family. After consideration of risks, benefits and other options for treatment, the patient has consented to  Procedure(s) with comments: COLONOSCOPY (N/A) - DM / Eliquis EGD (ESOPHAGOGASTRODUODENOSCOPY) (N/A) as a surgical intervention.  The patient's history has been reviewed, patient examined, no change in status, stable for surgery.  I have reviewed the patient's chart and labs.  Questions were answered to the patient's satisfaction.     [place], Mahoganie Basher

## 2024-03-23 NOTE — Op Note (Signed)
 [facility] Gastroenterology Patient Name: Destiny Gilbert Procedure Date: 03/23/2024 9:34 AM MRN: [id number] Account #: [id number] Date of Birth: Oct 30, 1940 Admit Type: Outpatient Age: 83 Room: [facility] ENDO ROOM 2 Gender: Female Note Status: Finalized Instrument Name: Barnie GI Scope [id number] Procedure:             Upper GI endoscopy Indications:           Dysphagia, Heartburn Providers:             Motty Borin K. Aundria MD, MD Referring MD:          Nancyann BRAVO. Gasper, MD (Referring MD) Medicines:             Propofol  per Anesthesia Complications:         No immediate complications. Estimated blood loss: None. Procedure:             Pre-Anesthesia Assessment:                        - The risks and benefits of the procedure and the                         sedation options and risks were discussed with the                         patient. All questions were answered and informed                         consent was obtained.                        - Patient identification and proposed procedure were                         verified prior to the procedure by the nurse. The                         procedure was verified in the procedure room.                        - ASA Grade Assessment: III - A patient with severe                         systemic disease.                        - After reviewing the risks and benefits, the patient                         was deemed in satisfactory condition to undergo the                         procedure.                        After obtaining informed consent, the endoscope was                         passed under direct vision. Throughout the procedure,  the patient's blood pressure, pulse, and oxygen                         saturations were monitored continuously. The Endoscope                         was introduced through the mouth, and advanced to the                         third part of duodenum. The upper GI  endoscopy was                         accomplished without difficulty. The patient tolerated                         the procedure well. Findings:      Grade I varices were found in the middle third of the esophagus. They       were diminutive in size. Estimated blood loss: none.      One benign-appearing, intrinsic mild stenosis was found in the distal       esophagus. This stenosis measured 1.6 cm (inner diameter) x less than       one cm (in length). The stenosis was traversed. The scope was withdrawn.       Dilation was performed with a Maloney dilator with no resistance at 54       Fr. Estimated blood loss: none.      The exam of the esophagus was otherwise normal.      A 4 cm hiatal hernia was present.      The exam of the stomach was otherwise normal.      The examined duodenum was normal.      Moderate tortuosity of the mid to distal esophagus was noted compatible       with a diagnosis of Presbyesophagus.      The exam was otherwise without abnormality. Impression:            - Grade I esophageal varices.                        - Benign-appearing esophageal stenosis. Dilated.                        - 4 cm hiatal hernia.                        - Normal examined duodenum.                        - The examination was otherwise normal.                        - No specimens collected. Recommendation:        - Monitor results to esophageal dilation                        - Use Prilosec OTC 20 mg PO daily.                        - Proceed with colonoscopy Procedure Code(s):     --- Professional ---  56764, Esophagogastroduodenoscopy, flexible,                         transoral; diagnostic, including collection of                         specimen(s) by brushing or washing, when performed                         (separate procedure)                        43450, Dilation of esophagus, by unguided sound or                         bougie, single or multiple  passes Diagnosis Code(s):     --- Professional ---                        R12, Heartburn                        R13.10, Dysphagia, unspecified                        K44.9, Diaphragmatic hernia without obstruction or                         gangrene                        K22.2, Esophageal obstruction                        I85.00, Esophageal varices without bleeding CPT copyright 2022 American Medical Association. All rights reserved. The codes documented in this report are preliminary and upon coder review may  be revised to meet current compliance requirements. Ladell MARLA Boss MD, MD 03/23/2024 9:52:25 AM This report has been signed electronically. Number of Addenda: 0 Note Initiated On: 03/23/2024 9:34 AM Estimated Blood Loss:  Estimated blood loss: none. Estimated blood loss: none.      [facility]

## 2024-03-23 NOTE — Transfer of Care (Signed)
 Immediate Anesthesia Transfer of Care Note  Patient: Destiny Gilbert  Procedure(s) Performed: COLONOSCOPY EGD (ESOPHAGOGASTRODUODENOSCOPY) DILATION, ESOPHAGUS, USING MALONEY DILATOR  Patient Location: PACU  Anesthesia Type:General  Level of Consciousness: drowsy  Airway & Oxygen Therapy: Patient Spontanous Breathing and Patient connected to nasal cannula oxygen  Post-op Assessment: Report given to RN, Post -op Vital signs reviewed and stable, and Patient moving all extremities X 4  Post vital signs: Reviewed and stable  Last Vitals:  Vitals Value Taken Time  BP 123/84 03/23/24 10:12  Temp 36 C 03/23/24 10:12  Pulse 77 03/23/24 10:13  Resp 17 03/23/24 10:13  SpO2 97 % 03/23/24 10:13  Vitals shown include unfiled device data.  Last Pain:  Vitals:   03/23/24 1012  TempSrc: Temporal  PainSc:       Patients Stated Pain Goal: 7 (03/23/24 0843)  Complications: No notable events documented.

## 2024-03-23 NOTE — Anesthesia Preprocedure Evaluation (Signed)
 Anesthesia Evaluation  Patient identified by MRN, date of birth, ID band Patient awake    Reviewed: Allergy & Precautions, NPO status , Patient's Chart, lab work & pertinent test results  History of Anesthesia Complications Negative for: history of anesthetic complications  Airway Mallampati: III  TM Distance: >3 FB Neck ROM: full    Dental no notable dental hx.    Pulmonary neg shortness of breath, asthma , neg sleep apnea, neg COPD, neg recent URI   Pulmonary exam normal        Cardiovascular hypertension, On Medications (-) angina (-) Past MI and (-) Cardiac Stents + dysrhythmias Atrial Fibrillation (-) Valvular Problems/Murmurs     Neuro/Psych neg Seizures  Neuromuscular disease CVA  negative psych ROS   GI/Hepatic Neg liver ROS, hiatal hernia, PUD,,,  Endo/Other  diabetesHypothyroidism    Renal/GU Renal disease (stage 3b CKD)  negative genitourinary   Musculoskeletal   Abdominal   Peds  Hematology negative hematology ROS (+)   Anesthesia Other Findings Past Medical History: No date: Bladder infection No date: Diabetes mellitus without complication (HCC) No date: Diabetic acidosis (HCC) No date: Diverticulosis No date: History of peptic ulcer     Comment:  perforated No date: Hyperlipidemia No date: Hyperplastic colon polyp No date: Hypertension 01/23/2015: Pneumonia     Comment:  [facility]  No date: Stroke [facility])  Past Surgical History: 1994: ABDOMINAL HYSTERECTOMY     Comment:  BSO No date: CATARACT EXTRACTION     Comment:  left eye- 09/2014; righr eye- 08/13/2014 12/23/2016: COLONOSCOPY WITH PROPOFOL ; N/A     Comment:  Procedure: COLONOSCOPY WITH PROPOFOL ;  Surgeon:               Gaylyn Gladis PENNER, MD;  Location: [facility] ENDOSCOPY;                Service: Endoscopy;  Laterality: N/A; 05/16/2020: COLONOSCOPY WITH PROPOFOL ; N/A     Comment:  Procedure: COLONOSCOPY WITH PROPOFOL ;  Surgeon: Tol[place],                Ladell POUR, MD;  Location: [facility] ENDOSCOPY;  Service:               Gastroenterology;  Laterality: N/A; No date: ESOPHAGOGASTRODUODENOSCOPY 05/16/2020: ESOPHAGOGASTRODUODENOSCOPY (EGD) WITH PROPOFOL ; N/A     Comment:  Procedure: ESOPHAGOGASTRODUODENOSCOPY (EGD) WITH               PROPOFOL ;  Surgeon: Toledo, Ladell POUR, MD;  Location:               [facility] ENDOSCOPY;  Service: Gastroenterology;  Laterality:               N/A; No date: INSERTION OF CARDIAC MONITOR No date: INSERTION OF CARDIAC MONITOR 07/29/2017: LOOP RECORDER INSERTION; N/A     Comment:  Procedure: LOOP RECORDER INSERTION;  Surgeon: Ammon Blunt, MD;  Location: [facility] INVASIVE CV LAB;  Service:              Cardiovascular;  Laterality: N/A;     Reproductive/Obstetrics negative OB ROS                              Anesthesia Physical Anesthesia Plan  ASA: 3  Anesthesia Plan: General   Post-op Pain Management: Minimal or no pain anticipated   Induction: Intravenous  PONV Risk Score and Plan: 2 and  Propofol  infusion and TIVA  Airway Management Planned: Natural Airway and Nasal Cannula  Additional Equipment:   Intra-op Plan:   Post-operative Plan:   Informed Consent: I have reviewed the patients History and Physical, chart, labs and discussed the procedure including the risks, benefits and alternatives for the proposed anesthesia with the patient or authorized representative who has indicated his/her understanding and acceptance.     Dental Advisory Given  Plan Discussed with: Anesthesiologist, CRNA and Surgeon  Anesthesia Plan Comments: (Patient consented for risks of anesthesia including but not limited to:  - adverse reactions to medications - risk of airway placement if required - damage to eyes, teeth, lips or other oral mucosa - nerve damage due to positioning  - sore throat or hoarseness - Damage to heart, brain, nerves, lungs, other parts of body or  loss of life  Patient voiced understanding.)         Anesthesia Quick Evaluation

## 2024-03-24 NOTE — Anesthesia Postprocedure Evaluation (Signed)
 Anesthesia Post Note  Patient: Destiny Gilbert  Procedure(s) Performed: COLONOSCOPY EGD (ESOPHAGOGASTRODUODENOSCOPY) DILATION, ESOPHAGUS, USING MALONEY DILATOR  Patient location during evaluation: Endoscopy Anesthesia Type: General Level of consciousness: awake and alert Pain management: pain level controlled Vital Signs Assessment: post-procedure vital signs reviewed and stable Respiratory status: spontaneous breathing, nonlabored ventilation, respiratory function stable and patient connected to nasal cannula oxygen Cardiovascular status: blood pressure returned to baseline and stable Postop Assessment: no apparent nausea or vomiting Anesthetic complications: no   No notable events documented.   Last Vitals:  Vitals:   03/23/24 1020 03/23/24 1030  BP: 123/69   Pulse: 76 74  Resp: (!) 25 20  Temp:    SpO2: 98%     Last Pain:  Vitals:   03/23/24 1030  TempSrc:   PainSc: 0-No pain                 Prentice Murphy

## 2024-03-25 ENCOUNTER — Ambulatory Visit (INDEPENDENT_AMBULATORY_CARE_PROVIDER_SITE_OTHER): Admitting: Physician Assistant

## 2024-03-25 ENCOUNTER — Encounter: Payer: Self-pay | Admitting: Physician Assistant

## 2024-03-25 VITALS — BP 123/71 | HR 80 | Temp 98.0°F | Ht 62.0 in | Wt 164.0 lb

## 2024-03-25 DIAGNOSIS — R300 Dysuria: Secondary | ICD-10-CM

## 2024-03-25 DIAGNOSIS — E119 Type 2 diabetes mellitus without complications: Secondary | ICD-10-CM

## 2024-03-25 DIAGNOSIS — R82998 Other abnormal findings in urine: Secondary | ICD-10-CM | POA: Diagnosis not present

## 2024-03-25 LAB — POCT URINALYSIS DIPSTICK
Bilirubin, UA: NEGATIVE
Glucose, UA: NEGATIVE
Ketones, UA: NEGATIVE
Nitrite, UA: NEGATIVE
Protein, UA: NEGATIVE
Spec Grav, UA: 1.01 (ref 1.010–1.025)
Urobilinogen, UA: 0.2 U/dL
pH, UA: 6 (ref 5.0–8.0)

## 2024-03-25 MED ORDER — CEPHALEXIN 500 MG PO CAPS: 500.0000 mg | ORAL_CAPSULE | Freq: Two times a day (BID) | ORAL | 0 refills | Status: DC

## 2024-03-25 NOTE — Progress Notes (Signed)
 Established patient visit  Patient: Destiny Gilbert   DOB: 1941-01-31   83 y.o. Female  MRN: [id number] Visit Date: 03/25/2024  Today's healthcare provider: Jolynn Spencer, PA-C   Chief Complaint  Patient presents with   Urinary Tract Infection    Patient started having dysuria 2 days ago after having colonoscopy that morning.  She has been taking AZO since then and she states it is minimally better.  Still pain and frequency.  She has had some back pain but no fever.   Subjective     HPI     Urinary Tract Infection    Additional comments: Patient started having dysuria 2 days ago after having colonoscopy that morning.  She has been taking AZO since then and she states it is minimally better.  Still pain and frequency.  She has had some back pain but no fever.      Last edited by Desanto, Elena D, CMA on 03/25/2024  9:31 AM.       Discussed the use of AI scribe software for clinical note transcription with the patient, who gave verbal consent to proceed.  History of Present Illness Destiny Gilbert is an 83 year old female who presents with painful urination following a colonoscopy.  She began experiencing painful urination on Wednesday afternoon after a colonoscopy. The pain occurs just before urination and improves afterward. She took over-the-counter medication, azo, on Wednesday and Thursday, which provided some relief. Her urine appeared darker and cloudy this morning. There is no hematuria, chest pain, or shortness of breath. She has diabetes managed with metformin , with blood sugar levels between 115 and 122, and an A1c of approximately 6. She has not had a urinary tract infection in over a year.       01/01/2024    8:54 AM 11/26/2023    8:52 AM 02/25/2023   11:00 AM  Depression screen PHQ 2/9  Decreased Interest 0 0 0  Down, Depressed, Hopeless 0 0 0  PHQ - 2 Score 0 0 0  Altered sleeping 0 0   Tired, decreased energy 1 1   Change in appetite 1 0   Feeling bad or  failure about yourself  0 0   Trouble concentrating 0 0   Moving slowly or fidgety/restless 0 0   Suicidal thoughts 0 0   PHQ-9 Score 2 1   Difficult doing work/chores Somewhat difficult Not difficult at all       01/01/2024    8:54 AM 11/26/2023    8:53 AM  GAD 7 : Generalized Anxiety Score  Nervous, Anxious, on Edge 0 0  Control/stop worrying 0 0  Worry too much - different things 0 0  Trouble relaxing 0 0  Restless 0 0  Easily annoyed or irritable 1 0  Afraid - awful might happen 0 0  Total GAD 7 Score 1 0  Anxiety Difficulty Not difficult at all Not difficult at all    Medications: Outpatient Medications Prior to Visit  Medication Sig   acetaminophen  (TYLENOL ) 500 MG tablet Take 500 mg by mouth every 6 (six) hours as needed.   albuterol  (PROAIR  HFA) 108 (90 Base) MCG/ACT inhaler Inhale 2 puffs into the lungs every 6 (six) hours as needed.   amLODipine  (NORVASC ) 2.5 MG tablet TAKE 1 TABLET BY MOUTH ONCE DAILY (NEEDS  OFFICE  VISIT  FOR  FURTHER  REFILLS)   atorvastatin  (LIPITOR) 40 MG tablet Take 1 tablet by mouth once daily   Blood Glucose Monitoring  Suppl DEVI 1 each by Does not apply route in the morning, at noon, and at bedtime. May substitute to any manufacturer covered by patient's insurance.   cyclobenzaprine  (FLEXERIL ) 5 MG tablet Take 1 tablet (5 mg total) by mouth 3 (three) times daily as needed for muscle spasms.   ELIQUIS 5 MG TABS tablet Take 5 mg by mouth every 12 (twelve) hours.   fluticasone -salmeterol (ADVAIR) 250-50 MCG/ACT AEPB INHALE 1 DOSE BY MOUTH TWICE DAILY   GLUCOSAMINE-CHONDROITIN PO Take by mouth 2 (two) times daily.   guaiFENesin (MUCUS RELIEF PO) Take by mouth.   hydrocortisone  (PROCTO-MED HC ) 2.5 % rectal cream INSERT CREAM RECTALLY TO AFFECTED AREA TWICE DAILY   levocetirizine (XYZAL ) 5 MG tablet TAKE 1 TABLET BY MOUTH ONCE DAILY IN THE EVENING   levothyroxine  (SYNTHROID ) 75 MCG tablet Take 1 tablet (75 mcg total) by mouth daily.   loratadine  (EQ  ALLERGY RELIEF) 10 MG tablet Take 1 tablet (10 mg total) by mouth daily.   metFORMIN  (GLUCOPHAGE -XR) 500 MG 24 hr tablet TAKE 1 TABLET BY MOUTH ONCE DAILY FOR BLOOD SUGAR   nitroGLYCERIN  (NITROSTAT ) 0.3 MG SL tablet Place 1 tablet (0.3 mg total) under the tongue every 5 (five) minutes as needed for chest pain.   ONETOUCH ULTRA test strip USE 1 STRIP TO CHECK GLUCOSE ONCE DAILY   potassium chloride  (KLOR-CON ) 10 MEQ tablet Take 1 tablet by mouth twice daily   triamterene -hydrochlorothiazide  (MAXZIDE ) 75-50 MG tablet Take 1 tablet by mouth once daily   No facility-administered medications prior to visit.    Review of Systems All negative Except see HPI       Objective    BP 123/71 (BP Location: Right Arm, Patient Position: Sitting, Cuff Size: Normal)   Pulse 80   Temp 98 F (36.7 C) (Oral)   Ht 5' 2 (1.575 m)   Wt 164 lb (74.4 kg)   SpO2 96%   BMI 30.00 kg/m     Physical Exam Vitals reviewed.  Constitutional:      General: She is not in acute distress.    Appearance: She is well-developed.  HENT:     Head: Normocephalic and atraumatic.  Eyes:     General: No scleral icterus.    Conjunctiva/sclera: Conjunctivae normal.  Cardiovascular:     Rate and Rhythm: Normal rate and regular rhythm.     Heart sounds: Normal heart sounds. No murmur heard. Pulmonary:     Effort: Pulmonary effort is normal. No respiratory distress.     Breath sounds: Normal breath sounds. No wheezing or rales.  Abdominal:     General: There is no distension.     Palpations: Abdomen is soft.     Tenderness: There is abdominal tenderness in the suprapubic area. There is no right CVA tenderness, left CVA tenderness, guarding or rebound.  Skin:    General: Skin is warm and dry.     Capillary Refill: Capillary refill takes less than 2 seconds.     Findings: No rash.  Neurological:     Mental Status: She is alert and oriented to person, place, and time.  Psychiatric:        Behavior: Behavior  normal.      Results for orders placed or performed in visit on 03/25/24  POCT urinalysis dipstick  Result Value Ref Range   Color, UA yellow    Clarity, UA cloudy    Glucose, UA Negative Negative   Bilirubin, UA neg    Ketones, UA neg  Spec Grav, UA 1.010 1.010 - 1.025   Blood, UA 2+    pH, UA 6.0 5.0 - 8.0   Protein, UA Negative Negative   Urobilinogen, UA 0.2 0.2 or 1.0 E.U./dL   Nitrite, UA neg    Leukocytes, UA Large (3+) (A) Negative   Appearance     Odor          Assessment & Plan Urinary tract infection Confirmed by blood work showing large leukocytes. Recent colonoscopy may have contributed to onset. No history of yeast infections with antibiotic use. - Prescribed Keflex  for 7 days, twice daily. - Advised increased fluid intake and yogurt consumption to prevent yeast infection. - Sent urine for culture to confirm bacterial sensitivity. - Instructed to contact if symptoms persist after 5-6 days for potential extension of antibiotic course.  Type 2 diabetes mellitus without complications Chronic Well-controlled with metformin . Blood glucose levels range from 115 to 122 mg/dL, J8r in 6% range. - Continue metformin  as prescribed. Will follow-up    Orders Placed This Encounter  Procedures   Urine Culture   Urinalysis, microscopic only   POCT urinalysis dipstick    No follow-ups on file.   The patient was advised to call back or seek an in-person evaluation if the symptoms worsen or if the condition fails to improve as anticipated.  I discussed the assessment and treatment plan with the patient. The patient was provided an opportunity to ask questions and all were answered. The patient agreed with the plan and demonstrated an understanding of the instructions.  I, Yeriel Mineo, PA-C have reviewed all documentation for this visit. The documentation on 03/25/2024  for the exam, diagnosis, procedures, and orders are all accurate and complete.  Jolynn Spencer,  [facility], MMS [facility] [phone] (phone) [phone] (fax)  [facility]

## 2024-03-26 ENCOUNTER — Other Ambulatory Visit: Payer: Self-pay | Admitting: Family Medicine

## 2024-03-26 DIAGNOSIS — J45909 Unspecified asthma, uncomplicated: Secondary | ICD-10-CM

## 2024-03-26 DIAGNOSIS — I10 Essential (primary) hypertension: Secondary | ICD-10-CM

## 2024-03-26 DIAGNOSIS — J309 Allergic rhinitis, unspecified: Secondary | ICD-10-CM

## 2024-03-26 LAB — URINALYSIS, MICROSCOPIC ONLY
Casts: NONE SEEN /LPF
Epithelial Cells (non renal): >10 /HPF — AB (ref 0–10)
RBC, Urine: NONE SEEN /HPF (ref 0–2)
WBC, UA: >30 /HPF — AB (ref 0–5)

## 2024-03-27 ENCOUNTER — Other Ambulatory Visit: Payer: Self-pay | Admitting: Family Medicine

## 2024-03-27 DIAGNOSIS — I10 Essential (primary) hypertension: Secondary | ICD-10-CM

## 2024-03-29 ENCOUNTER — Ambulatory Visit: Payer: Self-pay | Admitting: Physician Assistant

## 2024-03-29 LAB — URINE CULTURE

## 2024-04-01 ENCOUNTER — Other Ambulatory Visit: Payer: Self-pay | Admitting: Physician Assistant

## 2024-04-01 ENCOUNTER — Telehealth: Payer: Self-pay

## 2024-04-01 DIAGNOSIS — N390 Urinary tract infection, site not specified: Secondary | ICD-10-CM

## 2024-04-01 MED ORDER — CEPHALEXIN 500 MG PO CAPS: 500.0000 mg | ORAL_CAPSULE | Freq: Two times a day (BID) | ORAL | 0 refills | Status: DC

## 2024-04-01 MED ORDER — CEPHALEXIN 500 MG PO CAPS
500.0000 mg | ORAL_CAPSULE | Freq: Two times a day (BID) | ORAL | 0 refills | Status: DC
Start: 2024-04-01 — End: 2024-04-01

## 2024-04-01 NOTE — Telephone Encounter (Signed)
 Copied from CRM #[id number]. Topic: Clinical - Medication Question >> Apr 01, 2024  9:21 AM Leonette SQUIBB wrote: Reason for CRM: patient called saying she seen her message on my chart that she had e-coli and to keep taking her medication.  She said she only has one day left.  Does she need to take more?  [phone] >> Apr 01, 2024 12:59 PM Larissa S wrote: Patient requesting a callback from PCP/nurse to discuss if additional medications will be sent to her pharmacy. Informed her of providers lab note, however she is requesting a callback from PCP/nurse.

## 2024-04-01 NOTE — Addendum Note (Signed)
 Addended by: Konstantina Nachreiner on: 04/01/2024 05:13 PM   Modules accepted: Orders

## 2024-04-01 NOTE — Telephone Encounter (Signed)
 Pt advised that if she is still having symptoms and near the end of her rx I would have to check with provider if she would like to continue medications or if she will need another specimen. Patient reports she called in at 9 am. I verbalized understanding and apologized for any incoveneince and expressed we try our best to respond in between patients but I will send her concern over now to [email or web address]. She verbalized understanding

## 2024-04-01 NOTE — Telephone Encounter (Signed)
 Copied from CRM #[id number]. Topic: Clinical - Medication Question >> Apr 01, 2024  9:21 AM Destiny Gilbert wrote: Reason for CRM: patient called saying she seen her message on my chart that she had e-coli and to keep taking her medication.  She said she only has one day left.  Does she need to take more?  [phone]

## 2024-06-13 ENCOUNTER — Ambulatory Visit

## 2024-06-13 VITALS — Ht 62.0 in | Wt 164.0 lb

## 2024-06-13 DIAGNOSIS — Z0000 Encounter for general adult medical examination without abnormal findings: Secondary | ICD-10-CM | POA: Diagnosis not present

## 2024-06-13 NOTE — Progress Notes (Signed)
 Chief Complaint  Patient presents with   Medicare Wellness     Subjective:   [name] is a 83 y.o. female who presents for a Medicare Annual Wellness Visit.  Visit info / Clinical Intake: Medicare Wellness Visit Type:: Subsequent Annual Wellness Visit Persons participating in visit and providing information:: patient Medicare Wellness Visit Mode:: Telephone If telephone:: video declined Since this visit was completed virtually, some vitals may be partially provided or unavailable. Missing vitals are due to the limitations of the virtual format.: Unable to obtain vitals - no equipment If Telephone or Video please confirm:: I connected with patient using audio/video enable telemedicine. I verified patient identity with two identifiers, discussed telehealth limitations, and patient agreed to proceed. Patient Location:: home Provider Location:: home Interpreter Needed?: No Pre-visit prep was completed: no AWV questionnaire completed by patient prior to visit?: no Living arrangements:: lives with spouse/significant other Patient's Overall Health Status Rating: very good Typical amount of pain: none Does pain affect daily life?: no Are you currently prescribed opioids?: no  Dietary Habits and Nutritional Risks How many meals a day?: 3 Eats fruit and vegetables daily?: (!) no (2-3 times a week) Most meals are obtained by: preparing own meals In the last 2 weeks, have you had any of the following?: none Diabetic:: (!) yes Any non-healing wounds?: no How often do you check your BS?: 1 (once a week) Would you like to be referred to a Nutritionist or for Diabetic Management? : no  Functional Status Activities of Daily Living (to include ambulation/medication): Independent Ambulation: Independent with device- listed below Home Assistive Devices/Equipment: Eyeglasses Medication Administration: Independent Home Management (perform basic housework or laundry):  Independent Manage your own finances?: yes Primary transportation is: driving Concerns about vision?: no *vision screening is required for WTM* Concerns about hearing?: no  Fall Screening Falls in the past year?: 0 Number of falls in past year: 0 Was there an injury with Fall?: 0 Fall Risk Category Calculator: 0 Patient Fall Risk Level: Low Fall Risk  Fall Risk Patient at Risk for Falls Due to: No Fall Risks Fall risk Follow up: Falls evaluation completed; Falls prevention discussed  Home and Transportation Safety: All rugs have non-skid backing?: N/A, no rugs All stairs or steps have railings?: N/A, no stairs Grab bars in the bathtub or shower?: yes Have non-skid surface in bathtub or shower?: yes Good home lighting?: yes Regular seat belt use?: yes Hospital stays in the last year:: no  Cognitive Assessment Difficulty concentrating, remembering, or making decisions? : no Will 6CIT or Mini Cog be Completed: yes What year is it?: 0 points What month is it?: 0 points Give patient an address phrase to remember (5 components): [address], [place] what time is it?: 0 points Count backwards from 20 to 1: 2 points (3) Say the months of the year in reverse: 2 points (Sept) Repeat the address phrase from earlier: 0 points 6 CIT Score: 4 points  Advance Directives (For Healthcare) Does Patient Have a Medical Advance Directive?: No Would patient like information on creating a medical advance directive?: No - Patient declined  Reviewed/Updated  Reviewed/Updated: Reviewed All (Medical, Surgical, Family, Medications, Allergies, Care Teams, Patient Goals)    Allergies (verified) Dipyridamole , Sulfa antibiotics, and Sulfasalazine   Current Medications (verified) Outpatient Encounter Medications as of 06/13/2024  Medication Sig   acetaminophen  (TYLENOL ) 500 MG tablet Take 500 mg by mouth every 6 (six) hours as needed.   albuterol  (PROAIR  HFA) 108 (  90 Base) MCG/ACT inhaler  Inhale 2 puffs into the lungs every 6 (six) hours as needed.   amLODipine  (NORVASC ) 2.5 MG tablet TAKE 1 TABLET BY MOUTH ONCE DAILY (NEEDS  OFFICE  VISIT  FOR  FURTHER  REFILLS)   atorvastatin  (LIPITOR) 40 MG tablet Take 1 tablet by mouth once daily   Blood Glucose Monitoring Suppl DEVI 1 each by Does not apply route in the morning, at noon, and at bedtime. May substitute to any manufacturer covered by patient's insurance.   cephALEXin  (KEFLEX ) 500 MG capsule Take 1 capsule (500 mg total) by mouth 2 (two) times daily.   cyclobenzaprine  (FLEXERIL ) 5 MG tablet Take 1 tablet (5 mg total) by mouth 3 (three) times daily as needed for muscle spasms.   ELIQUIS 5 MG TABS tablet Take 5 mg by mouth every 12 (twelve) hours.   fluticasone -salmeterol (ADVAIR) 250-50 MCG/ACT AEPB INHALE 1 DOSE BY MOUTH TWICE DAILY   GLUCOSAMINE-CHONDROITIN PO Take by mouth 2 (two) times daily.   guaiFENesin (MUCUS RELIEF PO) Take by mouth.   hydrocortisone  (PROCTO-MED HC ) 2.5 % rectal cream INSERT CREAM RECTALLY TO AFFECTED AREA TWICE DAILY   levocetirizine (XYZAL ) 5 MG tablet TAKE 1 TABLET BY MOUTH ONCE DAILY IN THE EVENING   levothyroxine  (SYNTHROID ) 75 MCG tablet Take 1 tablet (75 mcg total) by mouth daily.   loratadine  (EQ ALLERGY RELIEF) 10 MG tablet Take 1 tablet (10 mg total) by mouth daily.   metFORMIN  (GLUCOPHAGE -XR) 500 MG 24 hr tablet TAKE 1 TABLET BY MOUTH ONCE DAILY FOR BLOOD SUGAR   nitroGLYCERIN  (NITROSTAT ) 0.3 MG SL tablet Place 1 tablet (0.3 mg total) under the tongue every 5 (five) minutes as needed for chest pain.   ONETOUCH ULTRA test strip USE 1 STRIP TO CHECK GLUCOSE ONCE DAILY   potassium chloride  (KLOR-CON ) 10 MEQ tablet Take 1 tablet by mouth twice daily   triamterene -hydrochlorothiazide  (MAXZIDE ) 75-50 MG tablet Take 1 tablet by mouth once daily   No facility-administered encounter medications on file as of 06/13/2024.    History: Past Medical History:  Diagnosis Date   Bladder infection     Diabetes mellitus without complication (HCC)    Diabetic acidosis (HCC)    Diverticulosis    History of peptic ulcer    perforated   Hyperlipidemia    Hyperplastic colon polyp    Hypertension    Paroxysmal atrial fibrillation (HCC)    Pneumonia 01/23/2015   [facility]    Stroke [facility])    Past Surgical History:  Procedure Laterality Date   ABDOMINAL HYSTERECTOMY  1994   BSO   CATARACT EXTRACTION     left eye- 09/2014; righr eye- 08/13/2014   COLONOSCOPY N/A 03/23/2024   Procedure: COLONOSCOPY;  Surgeon: Toledo, Ladell POUR, MD;  Location: [facility] ENDOSCOPY;  Service: Gastroenterology;  Laterality: N/A;  DM / Eliquis   COLONOSCOPY WITH PROPOFOL  N/A 12/23/2016   Procedure: COLONOSCOPY WITH PROPOFOL ;  Surgeon: Gaylyn Gladis PENNER, MD;  Location: [facility] ENDOSCOPY;  Service: Endoscopy;  Laterality: N/A;   COLONOSCOPY WITH PROPOFOL  N/A 05/16/2020   Procedure: COLONOSCOPY WITH PROPOFOL ;  Surgeon: Toledo, Ladell POUR, MD;  Location: [facility] ENDOSCOPY;  Service: Gastroenterology;  Laterality: N/A;   ESOPHAGOGASTRODUODENOSCOPY     ESOPHAGOGASTRODUODENOSCOPY N/A 03/23/2024   Procedure: EGD (ESOPHAGOGASTRODUODENOSCOPY);  Surgeon: Toledo, Ladell POUR, MD;  Location: [facility] ENDOSCOPY;  Service: Gastroenterology;  Laterality: N/A;   ESOPHAGOGASTRODUODENOSCOPY (EGD) WITH PROPOFOL  N/A 05/16/2020   Procedure: ESOPHAGOGASTRODUODENOSCOPY (EGD) WITH PROPOFOL ;  Surgeon: Toledo, Ladell POUR, MD;  Location: [facility] ENDOSCOPY;  Service: Gastroenterology;  Laterality: N/A;   ESOPHAGOGASTRODUODENOSCOPY (EGD) WITH PROPOFOL  N/A 03/25/2023   Procedure: ESOPHAGOGASTRODUODENOSCOPY (EGD) WITH PROPOFOL ;  Surgeon: Toledo, Ladell POUR, MD;  Location: [facility] ENDOSCOPY;  Service: Gastroenterology;  Laterality: N/A;   EYE SURGERY     INSERTION OF CARDIAC MONITOR     LOOP RECORDER INSERTION N/A 07/29/2017   Procedure: LOOP RECORDER INSERTION;  Surgeon: Ammon Blunt, MD;  Location: [facility] INVASIVE CV LAB;  Service: Cardiovascular;  Laterality: N/A;    MALONEY DILATION  03/23/2024   Procedure: DILATION, ESOPHAGUS, USING MALONEY DILATOR;  Surgeon: Toledo, Ladell POUR, MD;  Location: [facility] ENDOSCOPY;  Service: Gastroenterology;;   Family History  Problem Relation Age of Onset   Lung cancer Mother    Pancreatic cancer Father    Lung cancer Father    Hypertension Sister    Hyperlipidemia Sister    Breast cancer Neg Hx    Social History   Occupational History   Occupation: Retired  Tobacco Use   Smoking status: Never   Smokeless tobacco: Never  Vaping Use   Vaping status: Never Used  Substance and Sexual Activity   Alcohol use: Yes    Alcohol/week: 2.0 standard drinks of alcohol    Types: 2 Glasses of wine per week    Comment: 2 glasses of wine per week    Drug use: No   Sexual activity: Not on file   Tobacco Counseling Counseling given: Not Answered  SDOH Screenings   Food Insecurity: No Food Insecurity (06/13/2024)  Housing: Unknown (06/13/2024)  Transportation Needs: No Transportation Needs (06/13/2024)  Utilities: Not At Risk (06/13/2024)  Alcohol Screen: Low Risk  (02/25/2023)  Depression (PHQ2-9): Low Risk  (06/13/2024)  Financial Resource Strain: Low Risk  (02/25/2023)  Physical Activity: Inactive (06/13/2024)  Social Connections: Socially Integrated (06/13/2024)  Stress: No Stress Concern Present (06/13/2024)  Tobacco Use: Low Risk  (06/13/2024)  Health Literacy: Adequate Health Literacy (06/13/2024)   See flowsheets for full screening details  Depression Screen PHQ 2 & 9 Depression Scale- Over the past 2 weeks, how often have you been bothered by any of the following problems? Little interest or pleasure in doing things: 0 Feeling down, depressed, or hopeless (PHQ Adolescent also includes...irritable): 0 PHQ-2 Total Score: 0 Trouble falling or staying asleep, or sleeping too much: 0 Feeling tired or having little energy: 0 Poor appetite or overeating (PHQ Adolescent also includes...weight loss): 0 Feeling bad about  yourself - or that you are a failure or have let yourself or your family down: 0 Trouble concentrating on things, such as reading the newspaper or watching television (PHQ Adolescent also includes...like school work): 0 Moving or speaking so slowly that other people could have noticed. Or the opposite - being so fidgety or restless that you have been moving around a lot more than usual: 0 Thoughts that you would be better off dead, or of hurting yourself in some way: 0 PHQ-9 Total Score: 0 If you checked off any problems, how difficult have these problems made it for you to do your work, take care of things at home, or get along with other people?: Not difficult at all  Depression Treatment Depression Interventions/Treatment : EYV7-0 Score <4 Follow-up Not Indicated     Goals Addressed             This Visit's Progress    DIET - EAT MORE FRUITS AND VEGETABLES   On track            Objective:  Today's Vitals   06/13/24 1345  Weight: 164 lb (74.4 kg)  Height: 5' 2 (1.575 m)   Body mass index is 30 kg/m.  Hearing/Vision screen Hearing Screening - Comments:: Denies hearing difficulties   Vision Screening - Comments:: Wears eyeglasses/UTD/[facility] Immunizations and Health Maintenance Health Maintenance  Topic Date Due   Diabetic kidney evaluation - Urine ACR  11/05/2022   Bone Density Scan  02/04/2023   Influenza Vaccine  02/05/2024   COVID-19 Vaccine (5 - 2025-26 season) 03/07/2024   HEMOGLOBIN A1C  07/02/2024   OPHTHALMOLOGY EXAM  10/13/2024   Diabetic kidney evaluation - eGFR measurement  12/31/2024   Medicare Annual Wellness (AWV)  06/13/2025   DTaP/Tdap/Td (3 - Td or Tdap) 10/05/2032   Pneumococcal Vaccine: 50+ Years  Completed   Zoster Vaccines- Shingrix  Completed   Meningococcal B Vaccine  Aged Out   Colonoscopy  Discontinued        Assessment/Plan:  This is a routine wellness examination for Destiny Gilbert.  Patient Care Team: Gasper Nancyann BRAVO,  MD as PCP - General (Family Medicine) Ammon Blunt, MD as Consulting Physician (Cardiology) Lane Arthea BRAVO, MD as Referring Physician (Neurology) Blair Mt, MD as Referring Physician (Otolaryngology) Mittie Gaskin, MD as Referring Physician (Ophthalmology) Aundria Ladell POUR, MD as Consulting Physician (Gastroenterology) Onita Elspeth Sharper, DO as Consulting Physician (Gastroenterology)  I have personally reviewed and noted the following in the patient's chart:   Medical and social history Use of alcohol, tobacco or illicit drugs  Current medications and supplements including opioid prescriptions. Functional ability and status Nutritional status Physical activity Advanced directives List of other physicians Hospitalizations, surgeries, and ER visits in previous 12 months Vitals Screenings to include cognitive, depression, and falls Referrals and appointments  No orders of the defined types were placed in this encounter.  In addition, I have reviewed and discussed with patient certain preventive protocols, quality metrics, and best practice recommendations. A written personalized care plan for preventive services as well as general preventive health recommendations were provided to patient.   Srijan Givan L Hasini Peachey, CMA   06/13/2024   Return in 1 year (on 06/13/2025).  After Visit Summary: (MyChart) Due to this being a telephonic visit, the after visit summary with patients personalized plan was offered to patient via MyChart   Nurse Notes: Patient is due for a flu vaccine.  She is also due for a DEXA and order has been placed today.  Patient is due for a UACR and will have that done during her next office visit with provider.  She had no concerns to address today.

## 2024-06-13 NOTE — Patient Instructions (Addendum)
 Destiny Gilbert,  Thank you for taking the time for your Medicare Wellness Visit. I appreciate your continued commitment to your health goals. Please review the care plan we discussed, and feel free to reach out if I can assist you further.  Please note that Annual Wellness Visits do not include a physical exam. Some assessments may be limited, especially if the visit was conducted virtually. If needed, we may recommend an in-person follow-up with your provider.  Ongoing Care Seeing your primary care provider every 3 to 6 months helps us  monitor your health and provide consistent, personalized care. Last office visit on 03/25/2024.  You are due for a flu vaccine.  You are also due to have a kidney evaluation during your next office visit.  Each day, aim for 6 glasses of water, plenty of protein in your diet and try to get up and walk/ stretch every hour for 5-10 minutes at a time.    Referrals If a referral was made during today's visit and you haven't received any updates within two weeks, please contact the referred provider directly to check on the status. You have an order for:  [x]   Bone Density    Please call for appointment:  [facility]  [address] [place] [address] [phone]   Make sure to wear two-piece clothing.  No lotions, powders, or deodorants the day of the appointment. Make sure to bring picture ID and insurance card.  Bring list of medications you are currently taking including any supplements.    Recommended Screenings:  Health Maintenance  Topic Date Due   Yearly kidney health urinalysis for diabetes  11/05/2022   Osteoporosis screening with Bone Density Scan  02/04/2023   Flu Shot  02/05/2024   Medicare Annual Wellness Visit  02/25/2024   COVID-19 Vaccine (5 - 2025-26 season) 03/07/2024   Hemoglobin A1C  07/02/2024   Eye exam for diabetics  10/13/2024   Yearly kidney function blood test for diabetes   12/31/2024   DTaP/Tdap/Td vaccine (3 - Td or Tdap) 10/05/2032   Pneumococcal Vaccine for age over 76  Completed   Zoster (Shingles) Vaccine  Completed   Meningitis B Vaccine  Aged Out   Colon Cancer Screening  Discontinued       06/13/2024    1:50 PM  Advanced Directives  Does Patient Have a Medical Advance Directive? No    Vision: Annual vision screenings are recommended for early detection of glaucoma, cataracts, and diabetic retinopathy. These exams can also reveal signs of chronic conditions such as diabetes and high blood pressure.  Dental: Annual dental screenings help detect early signs of oral cancer, gum disease, and other conditions linked to overall health, including heart disease and diabetes.  Please see the attached documents for additional preventive care recommendations.

## 2024-06-22 ENCOUNTER — Other Ambulatory Visit: Payer: Self-pay | Admitting: Family Medicine

## 2024-06-22 DIAGNOSIS — J45909 Unspecified asthma, uncomplicated: Secondary | ICD-10-CM

## 2024-06-22 DIAGNOSIS — J309 Allergic rhinitis, unspecified: Secondary | ICD-10-CM

## 2024-07-04 ENCOUNTER — Other Ambulatory Visit: Payer: Self-pay | Admitting: Family Medicine

## 2024-07-04 DIAGNOSIS — I10 Essential (primary) hypertension: Secondary | ICD-10-CM

## 2024-07-15 ENCOUNTER — Ambulatory Visit: Admitting: Family Medicine

## 2024-07-15 ENCOUNTER — Encounter: Payer: Self-pay | Admitting: Family Medicine

## 2024-07-15 ENCOUNTER — Ambulatory Visit: Payer: Self-pay

## 2024-07-15 VITALS — BP 141/61 | HR 79 | Resp 14 | Ht 62.0 in | Wt 166.1 lb

## 2024-07-15 DIAGNOSIS — E2839 Other primary ovarian failure: Secondary | ICD-10-CM

## 2024-07-15 DIAGNOSIS — R82998 Other abnormal findings in urine: Secondary | ICD-10-CM | POA: Diagnosis not present

## 2024-07-15 DIAGNOSIS — S161XXA Strain of muscle, fascia and tendon at neck level, initial encounter: Secondary | ICD-10-CM

## 2024-07-15 DIAGNOSIS — I10 Essential (primary) hypertension: Secondary | ICD-10-CM | POA: Diagnosis not present

## 2024-07-15 DIAGNOSIS — E119 Type 2 diabetes mellitus without complications: Secondary | ICD-10-CM

## 2024-07-15 DIAGNOSIS — E039 Hypothyroidism, unspecified: Secondary | ICD-10-CM

## 2024-07-15 LAB — POCT URINALYSIS DIPSTICK
Bilirubin, UA: NEGATIVE
Glucose, UA: NEGATIVE
Ketones, UA: NEGATIVE
Nitrite, UA: NEGATIVE
Protein, UA: NEGATIVE
Spec Grav, UA: <=1.005 — AB
Urobilinogen, UA: 0.2 U/dL
pH, UA: 7.5

## 2024-07-15 MED ORDER — AMLODIPINE BESYLATE 2.5 MG PO TABS: 2.5000 mg | ORAL_TABLET | Freq: Every day | ORAL | 1 refills | Status: DC

## 2024-07-15 NOTE — Patient Instructions (Signed)
 SABRA  Please review the attached list of medications and notify my office if there are any errors.   . Please bring all of your medications to every appointment so we can make sure that our medication list is the same as yours.

## 2024-07-15 NOTE — Telephone Encounter (Signed)
"  Patient came in./    "

## 2024-07-15 NOTE — Telephone Encounter (Signed)
 FYI Only or Action Required?: Action required by provider: request for appointment and update on patient condition.  Patient was last seen in primary care on 03/25/2024 by Ostwalt, Janna, PA-C.  Called Nurse Triage reporting Fall, Neck Pain, and Arm Pain.  Symptoms began 2 days ago.  Interventions attempted: Nothing.  Symptoms are: gradually worsening.  Triage Disposition: See HCP Within 4 Hours (Or PCP Triage)  Patient/caregiver understands and will follow disposition?: No, wishes to speak with PCP                Copied from CRM #[id number]. Topic: Clinical - Red Word Triage >> Jul 15, 2024  9:46 AM Avram MATSU wrote: Red Word that prompted transfer to Nurse Triage: patient had a fall two days ago and have pain in neck with swollen gland.   ----------------------------------------------------------------------- From previous Reason for Contact - Scheduling: Patient/patient representative is calling to schedule an appointment. Refer to attachments for appointment information. Reason for Disposition  1] MILD or MODERATE neck pain AND [2] fall from 3 feet (1 meter) or 5 stairs, or higher  Answer Assessment - Initial Assessment Questions 1. MECHANISM: How did the fall happen?     Patient states she was sound asleep and thinks she rolled off the edge of the bed and hit the floor. She states she woke up after the fall (less than 10 feet about 3-4 feet off the ground).  2. DOMESTIC VIOLENCE AND ELDER ABUSE SCREENING: Did you fall because someone pushed you or tried to hurt you? If Yes, ask: Are you safe now?     No.  3. ONSET: When did the fall happen? (e.g., minutes, hours, or days ago)     Wednesday at 0530.  4. LOCATION: What part of the body hit the ground? (e.g., back, buttocks, head, hips, knees, hands, head, stomach)     Unsure what she landed on, thinks it was buttocks.  5. INJURY: Did you hurt (injure) yourself when you fell? If Yes, ask: What did you  injure? Tell me more about this? (e.g., body area; type of injury; pain severity)     Buttocks, neck, arms.  6. PAIN: Is there any pain? If Yes, ask: How bad is the pain? (e.g., Scale 0-10; or none, mild,      2-3/10  7. SIZE: For cuts, bruises, or swelling, ask: How large is it? (e.g., inches or centimeters)      No.  8. PREGNANCY: Is there any chance you are pregnant? When was your last menstrual period?     N/A.  9. OTHER SYMPTOMS: Do you have any other symptoms? (e.g., dizziness, fever, weakness; new-onset or worsening).      Headache (resolved), neck pain and tender to touch on left side started yesterday, buttocks pain immediately after fall, bilateral arm pain. Patient states she was calling in per the advice of a [place] nurse who called her on the phone and told her to follow up with her PCP for UTI from E coli; mentions not currently but sometimes has darker urine with a foul odor. No dark or red urine, confusion, dizziness, numbness or weakness.  10. CAUSE: What do you think caused the fall (or falling)? (e.g., dizzy spell, tripped)       Rolled out of bed.  Protocols used: Falls and Falling-A-AH, Neck Injury-A-AH

## 2024-07-15 NOTE — Progress Notes (Signed)
 "     Established patient visit   Patient: Destiny Gilbert   DOB: 1941-04-25   84 y.o. Female  MRN: [id number] Visit Date: 07/15/2024  Today's healthcare provider: [name], MD   Chief Complaint  Patient presents with   Fall    Pt was asleep. Does not remember what happen until she hit the floor happened at 5:30 AM 08-17-24. During fall hit back of head, buttock pain, neck soreness after driving 6 hours to her sister house that same day. Complains of headaches that comes and goes has not taken any otc med. Thursday fell off step and fell into leaves complains of bilateral arm soreness   Dysuria    Bad odor, dark urine. No pain.    Subjective    Discussed the use of AI scribe software for clinical note transcription with the patient, who gave verbal consent to proceed.  History of Present Illness   Destiny Gilbert is an 84 year old female who presents with neck pain following a fall.  She experienced a fall on Thursday at 5:30 AM, believing she may have been closer to the edge of the bed than she thought, resulting in hitting her head on the nightstand. Initially, she had a mild headache and a sore spot on her buttock. Later that day, she noticed neck pain, particularly when lying down, which became more pronounced by 08-17-24 afternoon while at her sister's house. The pain is located at the back of her neck, but she reports no shooting pain into her arms.  She also experienced a second fall at her sister's house, where she missed a step and fell flat on her face, resulting in arm pain. She can move her arms and bend her elbows, but movement causes discomfort. However, the pain in her neck remains her primary concern.  She has a history of urinary tract infections, with the most recent episode occurring about two months ago, which she attributes to being taken off her long-term medication during a colonoscopy and endoscopy. She reports no current burning or stinging but notes  occasional changes in urine odor.  She is currently taking amlodipine , which she is running low on, and mentions needing a refill. She monitors her blood pressure at home, which typically runs in the 130s to 140s over 60 to 65, but has been as low as 120 over 80. She is unsure if her thyroid  medication needs refilling.     She is also overdue for follow up of hypothyroid and diabetes, state taking current medications consistently without adverse effects and no symptoms of hyPOglycemia.   Medications: Show/hide medication list[1] Review of Systems  Constitutional:  Negative for appetite change, chills, fatigue and fever.  Respiratory:  Negative for chest tightness and shortness of breath.   Cardiovascular:  Negative for chest pain and palpitations.  Gastrointestinal:  Negative for abdominal pain, nausea and vomiting.  Neurological:  Negative for dizziness and weakness.       Objective    BP (!) 141/61   Pulse 79   Resp 14   Ht 5' 2 (1.575 m)   Wt 166 lb 1.6 oz (75.3 kg)   SpO2 97%   BMI 30.38 kg/m   Physical Exam   General: Appearance:    Mildly obese female in no acute distress  Eyes:    PERRL, conjunctiva/corneas clear, EOM's intact       Lungs:     Clear to auscultation bilaterally, respirations unlabored  Heart:    Normal  heart rate. Regular rhythm. No murmurs, rubs, or gallops.    MS:   All extremities are intact.  Mild tenderness left lateral SCM and paracervical muscles.   Neurologic:   Awake, alert, oriented x 3. No apparent focal neurological defect.         Results for orders placed or performed in visit on 07/15/24  POCT Urinalysis Dipstick  Result Value Ref Range   Color, UA Yellow    Clarity, UA Cloudy    Glucose, UA Negative Negative   Bilirubin, UA Negative    Ketones, UA Negative    Spec Grav, UA <=1.005 (A) 1.010 - 1.025   Blood, UA Trace (A)    pH, UA 7.5 5.0 - 8.0   Protein, UA Negative Negative   Urobilinogen, UA 0.2 0.2 or 1.0 E.U./dL    Nitrite, UA Negative    Leukocytes, UA Moderate (2+) (A) Negative   Appearance     Odor       Assessment & Plan    1. Strain of neck muscle, initial encounter (Primary) Expectant treatment.   2. Dark urine  - Urine Culture  3. Hypothyroidism, unspecified type Doing well on current thyroid  replacement.  - TSH + free T4  4. Estrogen deficiency  - DG Bone Density; Future  5. Primary hypertension Stable, consider current medications for now. Consider increasing amlodipine  to 5mg  if CBP remains elevated.   - amLODipine  (NORVASC ) 2.5 MG tablet; Take 1 tablet (2.5 mg total) by mouth daily.  Dispense: 90 tablet; Refill: 1 - Comprehensive metabolic panel with GFR  6. Type 2 diabetes mellitus without complication, without long-term current use of insulin  (HCC) Doing well current medications.  - Hemoglobin A1c   Return in about 4 months (around 11/12/2024) for Diabetes.     [name], MD  [facility] [phone] (phone) [phone] (fax)  [facility]     [1]  Outpatient Medications Prior to Visit  Medication Sig   acetaminophen  (TYLENOL ) 500 MG tablet Take 500 mg by mouth every 6 (six) hours as needed.   albuterol  (PROAIR  HFA) 108 (90 Base) MCG/ACT inhaler Inhale 2 puffs into the lungs every 6 (six) hours as needed.   atorvastatin  (LIPITOR) 40 MG tablet Take 1 tablet by mouth once daily   Blood Glucose Monitoring Suppl DEVI 1 each by Does not apply route in the morning, at noon, and at bedtime. May substitute to any manufacturer covered by patient's insurance.   cyclobenzaprine  (FLEXERIL ) 5 MG tablet Take 1 tablet (5 mg total) by mouth 3 (three) times daily as needed for muscle spasms.   ELIQUIS 5 MG TABS tablet Take 5 mg by mouth every 12 (twelve) hours.   fluticasone -salmeterol (ADVAIR) 250-50 MCG/ACT AEPB INHALE 1 DOSE BY MOUTH TWICE DAILY   GLUCOSAMINE-CHONDROITIN PO Take by mouth 2 (two) times daily.   hydrocortisone   (PROCTO-MED HC ) 2.5 % rectal cream INSERT CREAM RECTALLY TO AFFECTED AREA TWICE DAILY   levocetirizine (XYZAL ) 5 MG tablet TAKE 1 TABLET BY MOUTH ONCE DAILY IN THE EVENING   levothyroxine  (SYNTHROID ) 75 MCG tablet Take 1 tablet (75 mcg total) by mouth daily.   loratadine  (EQ ALLERGY RELIEF) 10 MG tablet Take 1 tablet (10 mg total) by mouth daily.   metFORMIN  (GLUCOPHAGE -XR) 500 MG 24 hr tablet TAKE 1 TABLET BY MOUTH ONCE DAILY FOR BLOOD SUGAR   nitroGLYCERIN  (NITROSTAT ) 0.3 MG SL tablet Place 1 tablet (0.3 mg total) under the tongue every 5 (five) minutes as needed  for chest pain.   ONETOUCH ULTRA test strip USE 1 STRIP TO CHECK GLUCOSE ONCE DAILY   potassium chloride  (KLOR-CON ) 10 MEQ tablet Take 1 tablet by mouth twice daily   triamterene -hydrochlorothiazide  (MAXZIDE ) 75-50 MG tablet Take 1 tablet by mouth once daily   [DISCONTINUED] amLODipine  (NORVASC ) 2.5 MG tablet TAKE 1 TABLET BY MOUTH ONCE DAILY . APPOINTMENT REQUIRED FOR FUTURE REFILLS   [DISCONTINUED] cephALEXin  (KEFLEX ) 500 MG capsule Take 1 capsule (500 mg total) by mouth 2 (two) times daily.   [DISCONTINUED] guaiFENesin (MUCUS RELIEF PO) Take by mouth.   No facility-administered medications prior to visit.   "

## 2024-07-15 NOTE — Telephone Encounter (Signed)
 She can have 2:20 per Doctor slot

## 2024-07-16 LAB — COMPREHENSIVE METABOLIC PANEL WITH GFR
ALT: 16 IU/L (ref 0–32)
AST: 25 IU/L (ref 0–40)
Albumin: 3.9 g/dL (ref 3.7–4.7)
Alkaline Phosphatase: 107 IU/L (ref 48–129)
BUN/Creatinine Ratio: 17 (ref 12–28)
BUN: 22 mg/dL (ref 8–27)
Bilirubin Total: 0.4 mg/dL (ref 0.0–1.2)
CO2: 24 mmol/L (ref 20–29)
Calcium: 9.6 mg/dL (ref 8.7–10.3)
Chloride: 98 mmol/L (ref 96–106)
Creatinine, Ser: 1.3 mg/dL — ABNORMAL HIGH (ref 0.57–1.00)
Globulin, Total: 2.7 g/dL (ref 1.5–4.5)
Glucose: 132 mg/dL — ABNORMAL HIGH (ref 70–99)
Potassium: 4.5 mmol/L (ref 3.5–5.2)
Sodium: 137 mmol/L (ref 134–144)
Total Protein: 6.6 g/dL (ref 6.0–8.5)
eGFR: 41 mL/min/1.73 — ABNORMAL LOW

## 2024-07-16 LAB — TSH+FREE T4
Free T4: 1.23 ng/dL (ref 0.82–1.77)
TSH: 0.97 u[IU]/mL (ref 0.450–4.500)

## 2024-07-16 LAB — HEMOGLOBIN A1C
Est. average glucose Bld gHb Est-mCnc: 134 mg/dL
Hgb A1c MFr Bld: 6.3 % — ABNORMAL HIGH (ref 4.8–5.6)

## 2024-07-17 ENCOUNTER — Ambulatory Visit: Payer: Self-pay | Admitting: Family Medicine

## 2024-07-17 DIAGNOSIS — N390 Urinary tract infection, site not specified: Secondary | ICD-10-CM

## 2024-07-19 LAB — URINE CULTURE

## 2024-07-19 MED ORDER — CEPHALEXIN 500 MG PO CAPS: 500.0000 mg | ORAL_CAPSULE | Freq: Three times a day (TID) | ORAL | 0 refills | Status: AC

## 2024-07-23 ENCOUNTER — Other Ambulatory Visit: Payer: Self-pay | Admitting: Family Medicine

## 2024-07-23 DIAGNOSIS — I10 Essential (primary) hypertension: Secondary | ICD-10-CM

## 2024-07-29 ENCOUNTER — Ambulatory Visit: Payer: Self-pay

## 2024-07-29 NOTE — Telephone Encounter (Signed)
 FYI Only or Action Required?: FYI only for provider: appointment scheduled on 1/26.  Patient was last seen in primary care on 07/15/2024 by Gasper Nancyann BRAVO, MD.  Called Nurse Triage reporting Leg Pain.  Symptoms began several weeks ago.  Interventions attempted: OTC medications: Magnesium  glycinate.  Symptoms are: gradually worsening.  Triage Disposition: See PCP When Office is Open (Within 3 Days) (overriding See PCP Within 2 Weeks)  Patient/caregiver understands and will follow disposition?: Yes   Cramps in both legs for past 2 weeks. Hx of leg cramps but they have gotten worse recently.  Has been getting them every night, come and go constantly throughout the night. No recent injury or strain to legs. No CP, SOB or leg swelling. Able to walk around. No fever.   Episode severe cramping pain 1 night a week ago. Became  nauseous, dizzy and passed out d/t pain being so severe while she was getting up to the BR to get warm water for her legs. Lost consciousness for a few minutes. Unsure if she hit her head. No changes to speech or vision. No headache. Speaking in clear continuous sentences. Takes eliquis. Scheduled soonest appt with PCP on Monday. Advised UC or ED for worsening symptoms in the meantime.    Message from Avram MATSU sent at 07/29/2024  3:41 PM EST  Reason for Triage: pain in legs,ankles, and toes pain level 8.   Reason for Disposition  Leg pain or muscle cramp is a chronic symptom (recurrent or ongoing AND present > 4 weeks)    Severe cramping  Answer Assessment - Initial Assessment Questions 1. ONSET: When did the pain start?      2 weeks ago.   2. LOCATION: Where is the pain located?      Both legs  3. PAIN: How bad is the pain?    (Scale 1-10; or mild, moderate, severe)     Severe. Passed out and fell from the fall last week.  4. WORK OR EXERCISE: Has there been any recent work or exercise that involved this part of the body?      Denies  5. CAUSE:  What do you think is causing the leg pain?     Cramps  6. OTHER SYMPTOMS: Do you have any other symptoms? (e.g., chest pain, back pain, breathing difficulty, swelling, rash, fever, numbness, weakness)     Denies  Protocols used: Leg Pain-A-AH

## 2024-08-01 ENCOUNTER — Ambulatory Visit: Admitting: Family Medicine

## 2024-08-02 ENCOUNTER — Other Ambulatory Visit: Payer: Self-pay | Admitting: Family Medicine

## 2024-08-02 DIAGNOSIS — Z1231 Encounter for screening mammogram for malignant neoplasm of breast: Secondary | ICD-10-CM

## 2024-08-03 ENCOUNTER — Encounter: Payer: Self-pay | Admitting: Family Medicine

## 2024-08-03 ENCOUNTER — Ambulatory Visit: Admitting: Family Medicine

## 2024-08-03 VITALS — BP 146/83 | HR 86 | Resp 16 | Ht 62.0 in | Wt 165.0 lb

## 2024-08-03 DIAGNOSIS — M545 Low back pain, unspecified: Secondary | ICD-10-CM | POA: Diagnosis not present

## 2024-08-03 DIAGNOSIS — I63532 Cerebral infarction due to unspecified occlusion or stenosis of left posterior cerebral artery: Secondary | ICD-10-CM

## 2024-08-03 DIAGNOSIS — N1832 Chronic kidney disease, stage 3b: Secondary | ICD-10-CM

## 2024-08-03 DIAGNOSIS — E039 Hypothyroidism, unspecified: Secondary | ICD-10-CM | POA: Diagnosis not present

## 2024-08-03 DIAGNOSIS — E782 Mixed hyperlipidemia: Secondary | ICD-10-CM

## 2024-08-03 DIAGNOSIS — E1159 Type 2 diabetes mellitus with other circulatory complications: Secondary | ICD-10-CM

## 2024-08-03 DIAGNOSIS — I10 Essential (primary) hypertension: Secondary | ICD-10-CM | POA: Diagnosis not present

## 2024-08-03 DIAGNOSIS — R252 Cramp and spasm: Secondary | ICD-10-CM

## 2024-08-03 DIAGNOSIS — J45909 Unspecified asthma, uncomplicated: Secondary | ICD-10-CM

## 2024-08-03 DIAGNOSIS — K449 Diaphragmatic hernia without obstruction or gangrene: Secondary | ICD-10-CM

## 2024-08-03 DIAGNOSIS — K279 Peptic ulcer, site unspecified, unspecified as acute or chronic, without hemorrhage or perforation: Secondary | ICD-10-CM

## 2024-08-03 MED ORDER — CYCLOBENZAPRINE HCL 5 MG PO TABS: 5.0000 mg | ORAL_TABLET | Freq: Three times a day (TID) | ORAL | 1 refills | Status: AC | PRN

## 2024-08-03 MED ORDER — AMLODIPINE BESYLATE 2.5 MG PO TABS: 2.5000 mg | ORAL_TABLET | Freq: Every day | ORAL | 3 refills | Status: AC

## 2024-08-03 NOTE — Progress Notes (Signed)
 "     Established patient visit   Patient: Destiny Gilbert   DOB: 1940-07-17   84 y.o. Female  MRN: [id number] Visit Date: 08/03/2024  Today's healthcare provider: [name], MD   Chief Complaint  Patient presents with   Acute Visit   leg cramps    Leg cramps   Subjective    Discussed the use of AI scribe software for clinical note transcription with the patient, who gave verbal consent to proceed.  History of Present Illness   Destiny Gilbert is an 84 year old female who presents with severe leg cramps which caused a near syncopal episode.  She experiences severe leg cramps that have increased in intensity and frequency, often occurring at night. She manages them by increasing her water intake to three to four bottles a day and using a heating pad, which sometimes provides relief. She takes magnesium  glycinate, initially two tablets, but has increased to four tablets daily, which she believes may have helped reduce the severity of the cramps. In December, she was prescribed magnesium  glycinate. She has cyclobenzaprine  from a previous back pain episode but has not been using it regularly.  Last week, she experienced a syncopal episode following severe leg cramps, accompanied by nausea and dizziness, requiring her husband's assistance to regain consciousness. Her husband noted her eyes were glazed and she was unresponsive for a short period. She recalls a similar episode two weeks ago after a trip to [place] , where she experienced multiple cramps throughout the night, possibly due to inadequate hydration.  On January 7th, she fell and experienced neck pain. Following the recent syncopal episode, she noticed a lump on the back of her neck with a cool or numbing sensation and occasional quick pain. She has not been taking any medication for this.  She reports a chronic issue with diarrhea, which led to a colonoscopy that did not reveal any abnormalities. She has been taking a  probiotic recommended by her sister's herbal doctor, which she believes is helping to manage her bowel movements. Additionally, she takes a multivitamin and recently started taking B12 supplements, initially at a higher dose but has since adjusted to 2500 mcg daily.       Medications: Show/hide medication list[1] Review of Systems     Objective    BP (!) 146/83 (BP Location: Left Arm, Patient Position: Sitting)   Pulse 86   Resp 16   Ht 5' 2 (1.575 m)   Wt 165 lb (74.8 kg)   SpO2 98%   BMI 30.18 kg/m   Physical Exam   General: Appearance:    Mildly obese female in no acute distress  Eyes:    PERRL, conjunctiva/corneas clear, EOM's intact       Lungs:     Clear to auscultation bilaterally, respirations unlabored  Heart:    Normal heart rate. Regular rhythm. No murmurs, rubs, or gallops.    MS:   All extremities are intact.    Neurologic:   Awake, alert, oriented x 3. No apparent focal neurological defect.         Assessment & Plan        Leg cramps Intermittent leg cramps with severe episodes causing syncope. Low magnesium  and cholesterol medication side effects may contribute. - Ordered lab tests for magnesium  levels and thyroid  function. - Refilled cyclobenzaprine  prescription. - Continue magnesium  glycinate until lab results are available.  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 Probiotics beneficial. -  Continue probiotic use. - Prescribed amlodipine  for bowel regulation.  Hypothyroidism Thyroid  function not recently checked. Potential contribution to leg cramps. - Ordered lab tests to check thyroid  function.         [name], MD  [facility] [phone] (phone) [phone] (fax)  [facility]    [1]  Outpatient Medications Prior to Visit  Medication Sig   acetaminophen  (TYLENOL ) 500 MG tablet Take 500 mg by mouth every 6 (six) hours as  needed.   albuterol  (PROAIR  HFA) 108 (90 Base) MCG/ACT inhaler Inhale 2 puffs into the lungs every 6 (six) hours as needed.   atorvastatin  (LIPITOR) 40 MG tablet Take 1 tablet by mouth once daily   Blood Glucose Monitoring Suppl DEVI 1 each by Does not apply route in the morning, at noon, and at bedtime. May substitute to any manufacturer covered by patient's insurance.   ELIQUIS 5 MG TABS tablet Take 5 mg by mouth every 12 (twelve) hours.   fluticasone -salmeterol (ADVAIR) 250-50 MCG/ACT AEPB INHALE 1 DOSE BY MOUTH TWICE DAILY   GLUCOSAMINE-CHONDROITIN PO Take by mouth 2 (two) times daily.   hydrocortisone  (PROCTO-MED HC ) 2.5 % rectal cream INSERT CREAM RECTALLY TO AFFECTED AREA TWICE DAILY   levocetirizine (XYZAL ) 5 MG tablet TAKE 1 TABLET BY MOUTH ONCE DAILY IN THE EVENING   levothyroxine  (SYNTHROID ) 75 MCG tablet Take 1 tablet (75 mcg total) by mouth daily.   loratadine  (EQ ALLERGY RELIEF) 10 MG tablet Take 1 tablet (10 mg total) by mouth daily.   metFORMIN  (GLUCOPHAGE -XR) 500 MG 24 hr tablet TAKE 1 TABLET BY MOUTH ONCE DAILY FOR BLOOD SUGAR   nitroGLYCERIN  (NITROSTAT ) 0.3 MG SL tablet Place 1 tablet (0.3 mg total) under the tongue every 5 (five) minutes as needed for chest pain.   ONETOUCH ULTRA test strip USE 1 STRIP TO CHECK GLUCOSE ONCE DAILY   potassium chloride  (KLOR-CON ) 10 MEQ tablet Take 1 tablet by mouth twice daily   triamterene -hydrochlorothiazide  (MAXZIDE ) 75-50 MG tablet Take 1 tablet by mouth once daily   [DISCONTINUED] amLODipine  (NORVASC ) 2.5 MG tablet Take 1 tablet (2.5 mg total) by mouth daily.   [DISCONTINUED] cyclobenzaprine  (FLEXERIL ) 5 MG tablet Take 1 tablet (5 mg total) by mouth 3 (three) times daily as needed for muscle spasms.   No facility-administered medications prior to visit.   "

## 2024-08-03 NOTE — Patient Instructions (Signed)
 SABRA  Please review the attached list of medications and notify my office if there are any errors.   . Please bring all of your medications to every appointment so we can make sure that our medication list is the same as yours.

## 2024-08-04 LAB — CK: Total CK: 111 U/L (ref 26–161)

## 2024-08-04 LAB — TSH+FREE T4
Free T4: 1.14 ng/dL (ref 0.82–1.77)
TSH: 1.36 u[IU]/mL (ref 0.450–4.500)

## 2024-08-04 LAB — MAGNESIUM: Magnesium: 2.1 mg/dL (ref 1.6–2.3)

## 2024-09-01 ENCOUNTER — Encounter

## 2024-09-01 ENCOUNTER — Other Ambulatory Visit

## 2024-11-14 ENCOUNTER — Ambulatory Visit: Admitting: Family Medicine
